# Patient Record
Sex: Female | Born: 1935 | Race: White | Hispanic: No | Smoking: Never smoker
Health system: Southern US, Community
[De-identification: ages and names within clinical notes are randomized; demographics above are authoritative.]

## PROBLEM LIST (undated history)

## (undated) DIAGNOSIS — K219 Gastro-esophageal reflux disease without esophagitis: Secondary | ICD-10-CM

## (undated) DIAGNOSIS — E7800 Pure hypercholesterolemia, unspecified: Secondary | ICD-10-CM

## (undated) DIAGNOSIS — B019 Varicella without complication: Secondary | ICD-10-CM

## (undated) DIAGNOSIS — M1990 Unspecified osteoarthritis, unspecified site: Secondary | ICD-10-CM

## (undated) DIAGNOSIS — I10 Essential (primary) hypertension: Secondary | ICD-10-CM

## (undated) HISTORY — DX: Unspecified osteoarthritis, unspecified site: M19.90

## (undated) HISTORY — DX: Essential (primary) hypertension: I10

## (undated) HISTORY — DX: Gastro-esophageal reflux disease without esophagitis: K21.9

## (undated) HISTORY — DX: Pure hypercholesterolemia, unspecified: E78.00

## (undated) HISTORY — DX: Varicella without complication: B01.9

---
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
## 1974-04-09 HISTORY — PX: ABDOMINAL HYSTERECTOMY: SHX81

## 2004-07-24 ENCOUNTER — Ambulatory Visit: Payer: Self-pay | Admitting: Internal Medicine

## 2005-12-25 ENCOUNTER — Ambulatory Visit: Payer: Self-pay | Admitting: Internal Medicine

## 2007-01-23 ENCOUNTER — Ambulatory Visit: Payer: Self-pay | Admitting: Internal Medicine

## 2008-02-10 ENCOUNTER — Ambulatory Visit: Payer: Self-pay | Admitting: Internal Medicine

## 2009-02-17 ENCOUNTER — Ambulatory Visit: Payer: Self-pay | Admitting: Internal Medicine

## 2010-06-15 ENCOUNTER — Encounter: Payer: Self-pay | Admitting: Internal Medicine

## 2010-07-09 ENCOUNTER — Encounter: Payer: Self-pay | Admitting: Internal Medicine

## 2010-09-20 ENCOUNTER — Ambulatory Visit: Payer: Self-pay | Admitting: Internal Medicine

## 2010-10-16 ENCOUNTER — Ambulatory Visit: Payer: Self-pay | Admitting: Ophthalmology

## 2010-10-24 ENCOUNTER — Ambulatory Visit: Payer: Self-pay | Admitting: Ophthalmology

## 2010-12-06 ENCOUNTER — Ambulatory Visit: Payer: Self-pay | Admitting: Internal Medicine

## 2010-12-20 ENCOUNTER — Ambulatory Visit: Payer: Self-pay | Admitting: Ophthalmology

## 2011-01-01 ENCOUNTER — Ambulatory Visit: Payer: Self-pay | Admitting: Ophthalmology

## 2011-02-15 ENCOUNTER — Ambulatory Visit: Payer: Self-pay | Admitting: Gastroenterology

## 2011-02-19 LAB — PATHOLOGY REPORT

## 2011-08-03 ENCOUNTER — Ambulatory Visit: Payer: Self-pay | Admitting: Internal Medicine

## 2011-08-03 LAB — HM MAMMOGRAPHY

## 2011-08-06 ENCOUNTER — Ambulatory Visit: Payer: Self-pay | Admitting: Internal Medicine

## 2012-01-14 ENCOUNTER — Ambulatory Visit (INDEPENDENT_AMBULATORY_CARE_PROVIDER_SITE_OTHER): Payer: Medicare Other | Admitting: Internal Medicine

## 2012-01-14 ENCOUNTER — Encounter: Payer: Self-pay | Admitting: Internal Medicine

## 2012-01-14 VITALS — BP 120/72 | HR 56 | Temp 97.8°F | Ht 65.25 in | Wt 147.5 lb

## 2012-01-14 DIAGNOSIS — R202 Paresthesia of skin: Secondary | ICD-10-CM

## 2012-01-14 DIAGNOSIS — R350 Frequency of micturition: Secondary | ICD-10-CM

## 2012-01-14 DIAGNOSIS — R209 Unspecified disturbances of skin sensation: Secondary | ICD-10-CM

## 2012-01-14 DIAGNOSIS — E7800 Pure hypercholesterolemia, unspecified: Secondary | ICD-10-CM | POA: Insufficient documentation

## 2012-01-14 DIAGNOSIS — K219 Gastro-esophageal reflux disease without esophagitis: Secondary | ICD-10-CM

## 2012-01-14 DIAGNOSIS — I10 Essential (primary) hypertension: Secondary | ICD-10-CM

## 2012-01-14 DIAGNOSIS — E871 Hypo-osmolality and hyponatremia: Secondary | ICD-10-CM

## 2012-01-14 DIAGNOSIS — Z23 Encounter for immunization: Secondary | ICD-10-CM

## 2012-01-14 LAB — POCT URINALYSIS DIPSTICK
Bilirubin, UA: NEGATIVE
Ketones, UA: NEGATIVE
Spec Grav, UA: 1.015
pH, UA: 7.5

## 2012-01-14 NOTE — Progress Notes (Signed)
  Subjective:    Patient ID: Janet Moran, female    DOB: February 14, 1926, 78 y.o.   MRN: [id number]  HPI 78 year old female with past history of hypertension, hypercholesterolemia and GERD who comes in today for a scheduled follow up.  She states she has been doing well.  Has noticed that her urine is more yellow and has a strong odor.  (Present now for approximately one month).  No abdominal pain or discomfort.  No dysuria or hematuria, but does report some increased frequency.  No new medications or vitamins.  Blood pressure has been doing well.  (averaging 120's/60s).  Prilosec controls her reflux.  She has noticed some left hand tingling and numbness - worse at night and when she holds a book, etc.  No numbness or tingling extending up the arm.  No arm weakness.  No chest pain or tightness.  Is not left handed, but does do a lot of activities with her left hand.    Past Medical History  Diagnosis Date  . Arthritis   . Chicken pox   . GERD (gastroesophageal reflux disease)   . Glaucoma   . Hypertension   . Hypercholesterolemia     Review of Systems Patient denies any headache, lightheadedness or dizziness.  No chest pain, tightness or palpatations.  No increased shortness of breath, cough or congestion.  No nausea or vomiting. Acid reflux controlled. No abdominal pain or cramping.  No bowel change, such as diarrhea, constipation, BRBPR or melana. No vaginal problems.          Objective:   Physical Exam Filed Vitals:   01/14/12 1044  BP: 120/72  Pulse: 56  Temp: 97.8 F (96.71 C)   78 year old female in no acute distress.   HEENT:  Nares - clear.  OP- without lesions or erythema.  NECK:  Supple, nontender.  No audible carotid bruit.   HEART:  Appears to be regular. LUNGS:  Without crackles or wheezing audible.  Respirations even and unlabored.   RADIAL PULSE:  Equal bilaterally.  ABDOMEN:  Soft, nontender.  No audible abdominal bruit.   EXTREMITIES:  No increased edema to be  present.        Grip strength - equal bilaterally (normal).  Negative Phalens and Tinels.                Assessment & Plan:  1.  URINARY FREQUENCY.  Check urine dip to confirm no infection.    2.  LEFT HAND NUMBNESS/TINGLING.  Appears to be consistant with CTS.  Treat conservatively.  Cock up wrist splint.  Call with update over the next couple of weeks.  If persistant problems will required further evaluation.  Call if symptoms worsen or change.    HEALTH MAINTENANCE.  Schedule her physical 07/2012.  Mammogram 08/03/11 (including follow up right unilateral mammo) - ok.  Schedule follow up when due.  Received shingles vaccine 07/24/11.  Flu shot given today.

## 2012-01-14 NOTE — Assessment & Plan Note (Signed)
Blood pressure has been under good control.  Continue same medications.  Follow metabolic panel.   

## 2012-01-14 NOTE — Assessment & Plan Note (Signed)
Continue same medication.  Low cholesterol diet and exercise.  Follow lipid profile and liver panel.   

## 2012-01-14 NOTE — Assessment & Plan Note (Signed)
Has a history of slightly decreased sodium.  Has been stable.  Check metabolic panel with next labs.

## 2012-01-14 NOTE — Patient Instructions (Signed)
It was good seeing you today.  I am going to check your urine to confirm no infection.  We will let you know when results are available.  I also want you to try wearing the wrist splint (as we discussed) - to see if this helps the hand numbness and tingling.  Let me know if it is a persistant problem.

## 2012-01-14 NOTE — Assessment & Plan Note (Signed)
Reflux symptoms controlled.  Continue Prilosec.

## 2012-01-16 LAB — URINE CULTURE

## 2012-03-17 ENCOUNTER — Other Ambulatory Visit: Payer: Self-pay | Admitting: *Deleted

## 2012-03-17 MED ORDER — LOSARTAN POTASSIUM-HCTZ 100-25 MG PO TABS: 1.0000 | ORAL_TABLET | Freq: Every day | ORAL | Status: DC

## 2012-03-17 MED ORDER — AMLODIPINE BESYLATE 5 MG PO TABS: 5.0000 mg | ORAL_TABLET | Freq: Every day | ORAL | Status: DC

## 2012-03-17 NOTE — Telephone Encounter (Signed)
Refilled amlodipine 5 mg and losartan/hctz 100-25 mg tab

## 2012-06-11 ENCOUNTER — Other Ambulatory Visit: Payer: Self-pay | Admitting: *Deleted

## 2012-06-11 MED ORDER — LOSARTAN POTASSIUM-HCTZ 100-25 MG PO TABS: 1.0000 | ORAL_TABLET | Freq: Every day | ORAL | Status: DC

## 2012-06-11 NOTE — Telephone Encounter (Signed)
Sent in to pharmacy.  

## 2012-07-09 ENCOUNTER — Encounter: Payer: Self-pay | Admitting: Internal Medicine

## 2012-07-11 ENCOUNTER — Other Ambulatory Visit: Payer: Self-pay | Admitting: *Deleted

## 2012-07-14 MED ORDER — AMLODIPINE BESYLATE 5 MG PO TABS: 5.0000 mg | ORAL_TABLET | Freq: Every day | ORAL | Status: DC

## 2012-07-14 NOTE — Telephone Encounter (Signed)
Rx sent in to pharmacy. 

## 2012-07-17 ENCOUNTER — Other Ambulatory Visit (INDEPENDENT_AMBULATORY_CARE_PROVIDER_SITE_OTHER): Payer: Medicare Other

## 2012-07-17 DIAGNOSIS — I10 Essential (primary) hypertension: Secondary | ICD-10-CM

## 2012-07-17 DIAGNOSIS — E7800 Pure hypercholesterolemia, unspecified: Secondary | ICD-10-CM

## 2012-07-17 LAB — HEPATIC FUNCTION PANEL
ALT: 16 U/L (ref 0–35)
Total Protein: 6.8 g/dL (ref 6.0–8.3)

## 2012-07-17 LAB — LIPID PANEL
Cholesterol: 184 mg/dL (ref 0–200)
HDL: 65.4 mg/dL (ref >39.00)
VLDL: 20.4 mg/dL (ref 0.0–40.0)

## 2012-07-18 LAB — BASIC METABOLIC PANEL WITH GFR
Chloride: 95 mEq/L — ABNORMAL LOW (ref 96–112)
GFR, Est Non African American: 85 mL/min
Potassium: 3.5 mEq/L (ref 3.5–5.3)
Sodium: 132 mEq/L — ABNORMAL LOW (ref 135–145)

## 2012-07-22 ENCOUNTER — Ambulatory Visit (INDEPENDENT_AMBULATORY_CARE_PROVIDER_SITE_OTHER): Payer: Medicare Other | Admitting: Internal Medicine

## 2012-07-22 ENCOUNTER — Encounter: Payer: Self-pay | Admitting: Internal Medicine

## 2012-07-22 VITALS — BP 122/70 | HR 67 | Temp 97.4°F | Ht 65.5 in | Wt 145.5 lb

## 2012-07-22 DIAGNOSIS — E871 Hypo-osmolality and hyponatremia: Secondary | ICD-10-CM

## 2012-07-22 DIAGNOSIS — Z1239 Encounter for other screening for malignant neoplasm of breast: Secondary | ICD-10-CM

## 2012-07-22 DIAGNOSIS — K219 Gastro-esophageal reflux disease without esophagitis: Secondary | ICD-10-CM

## 2012-07-22 DIAGNOSIS — I10 Essential (primary) hypertension: Secondary | ICD-10-CM

## 2012-07-22 DIAGNOSIS — E7800 Pure hypercholesterolemia, unspecified: Secondary | ICD-10-CM

## 2012-07-22 LAB — SODIUM: Sodium: 128 mEq/L — ABNORMAL LOW (ref 135–145)

## 2012-07-22 MED ORDER — METOPROLOL SUCCINATE ER 50 MG PO TB24: ORAL_TABLET | ORAL | Status: DC

## 2012-07-22 MED ORDER — SIMVASTATIN 20 MG PO TABS: 20.0000 mg | ORAL_TABLET | Freq: Every evening | ORAL | Status: DC

## 2012-07-23 ENCOUNTER — Telehealth: Payer: Self-pay | Admitting: Internal Medicine

## 2012-07-23 DIAGNOSIS — E871 Hypo-osmolality and hyponatremia: Secondary | ICD-10-CM

## 2012-07-23 NOTE — Telephone Encounter (Signed)
Dr Lorin Picket we will not be in the office on Friday.  I called and reschedule her labs to Monday.  Is this ok or do you want Ms Orbach come in tomorrow? Thanks Zella Ball

## 2012-07-23 NOTE — Telephone Encounter (Signed)
Changed to 4/17 pt aware

## 2012-07-23 NOTE — Telephone Encounter (Signed)
I would like for her to come in Thursday.  Please call her and let her know.  Thanks.

## 2012-07-23 NOTE — Telephone Encounter (Signed)
Pt notified of low sodium.  Instructions given.  She is coming in for follow up lab on 07/25/12 (10:00).   Please put on lab schedule.  Pt aware of appt.  Thanks.

## 2012-07-24 ENCOUNTER — Other Ambulatory Visit (INDEPENDENT_AMBULATORY_CARE_PROVIDER_SITE_OTHER): Payer: Medicare Other

## 2012-07-24 DIAGNOSIS — E7800 Pure hypercholesterolemia, unspecified: Secondary | ICD-10-CM

## 2012-07-24 DIAGNOSIS — E871 Hypo-osmolality and hyponatremia: Secondary | ICD-10-CM

## 2012-07-24 DIAGNOSIS — K219 Gastro-esophageal reflux disease without esophagitis: Secondary | ICD-10-CM

## 2012-07-24 DIAGNOSIS — I10 Essential (primary) hypertension: Secondary | ICD-10-CM

## 2012-07-24 LAB — SODIUM: Sodium: 137 mEq/L (ref 135–145)

## 2012-07-25 ENCOUNTER — Encounter: Payer: Self-pay | Admitting: Internal Medicine

## 2012-07-27 ENCOUNTER — Encounter: Payer: Self-pay | Admitting: Internal Medicine

## 2012-07-27 NOTE — Assessment & Plan Note (Signed)
Continue Prilosec in the am.  With some persistent acid reflux.  May be contributing to allergy issues and cough.  Add zantac in the evening.  Follow.  Notify me if persistent.

## 2012-07-27 NOTE — Assessment & Plan Note (Signed)
Continue same medication.  Low cholesterol diet and exercise.  Follow lipid profile and liver panel.  Recent cholesterol panel 07/17/12 reviewed.  No changes made.  Wnl.

## 2012-07-27 NOTE — Progress Notes (Signed)
Subjective:    Patient ID: Janet Moran, female    DOB: [birth year], 79 y.o.   MRN: [id number]  HPI 79 year old female with past history of hypertension, hypercholesterolemia and GERD who comes in today to follow up on these issues as well as for a complete physical exam.  She states she has been doing well.  [place].   No abdominal pain or discomfort.  No dysuria or hematuria.   Blood pressure has been doing well.   Had a cold in 3/14.  Now using ocean spray at night.  No significant problems now.  Some acid reflux.  On prilosec.  Bowels are stable.  States she takes metamucil in the am and prune juice in the evening - and this works.  Overall she feels things are stable.    Past Medical History  Diagnosis Date  . Arthritis   . Chicken pox   . GERD (gastroesophageal reflux disease)   . Glaucoma   . Hypertension   . Hypercholesterolemia     Current Outpatient Prescriptions on File Prior to Visit  Medication Sig Dispense Refill  . amLODipine (NORVASC) 5 MG tablet Take 1 tablet (5 mg total) by mouth daily.  30 tablet  5  . aspirin EC 81 MG tablet Take 81 mg by mouth daily.      . calcium carbonate (OS-CAL) 600 MG TABS Take 600 mg by mouth 2 (two) times daily with a meal. Take 1 tablet by mouth twice daily with food      . dorzolamide (TRUSOPT) 2 % ophthalmic solution 1 drop 3 (three) times daily. Apply 1 drop into both eyes twice a day      . latanoprost (XALATAN) 0.005 % ophthalmic solution Place 1 drop into both eyes at bedtime.      Marland Kitchen losartan-hydrochlorothiazide (HYZAAR) 100-25 MG per tablet Take 1 tablet by mouth daily.  30 tablet  5  . omeprazole (PRILOSEC) 20 MG capsule Take 20 mg by mouth daily.      . fish oil-omega-3 fatty acids 1000 MG capsule Take 2 g by mouth daily. Take 1 capsule once a day      . timolol (BETIMOL) 0.25 % ophthalmic solution Place 1-2 drops into both eyes 2 (two) times daily.      Marland Kitchen triamcinolone (NASACORT) 55 MCG/ACT nasal inhaler Place 2 sprays  into the nose daily. Spray 1-2 sprays into both nostrils once a day as needed for allergies       No current facility-administered medications on file prior to visit.      Review of Systems Patient denies any headache, lightheadedness or dizziness.  Previous cold has essentially resolved.  No chest pain, tightness or palpitations.  No increased shortness of breath.  Some cough now.  Relates it to allergies.   No nausea or vomiting.  Some acid reflux.  Taking her prilosec.   No abdominal pain or cramping.  No bowel change, such as diarrhea, constipation, BRBPR or melana.  Controls bowels with metamucil and prune juice as outlined.   No vaginal problems.          Objective:   Physical Exam  Filed Vitals:   07/22/12 1025  BP: 122/70  Pulse: 67  Temp: 97.4 F (61.40 C)   79 year old female in no acute distress.   HEENT:  Nares- clear.  Oropharynx - without lesions. NECK:  Supple.  Nontender.  No audible bruit.  HEART:  Appears to be regular. LUNGS:  No crackles or wheezing audible.  Respirations even and unlabored.  RADIAL PULSE:  Equal bilaterally.    BREASTS:  No nipple discharge or nipple retraction present.  Could not appreciate any distinct nodules or axillary adenopathy.  ABDOMEN:  Soft, nontender.  Bowel sounds present and normal.  No audible abdominal bruit.  GU:  Normal external genitalia.  Vaginal vault without lesions.  S/p hysterectomy.  Could not appreciate any adnexal masses or tenderness.   RECTAL:  Heme negative.   EXTREMITIES:  No increased edema present.  DP pulses palpable and equal bilaterally.          Assessment & Plan:  LEFT HAND NUMBNESS/TINGLING. Did not report as an issue today.   COUGH.  Probably multifactorial.  Treat allergy symptoms.  Treat acid reflux.  Lungs clear.  Follow.   HEALTH MAINTENANCE.  Physical today.   Mammogram 08/03/11 (including follow up right unilateral mammo) - ok.  Schedule follow up when due.  Received shingles vaccine 07/24/11.

## 2012-07-27 NOTE — Assessment & Plan Note (Signed)
Has a history of slightly decreased sodium.  Has been stable.  Recent check revealed sodium to be slightly decreased.  Recheck today.

## 2012-07-27 NOTE — Assessment & Plan Note (Signed)
Blood pressure has been under good control.  Continue same medications.  Follow metabolic panel.   

## 2012-07-28 ENCOUNTER — Other Ambulatory Visit: Payer: Medicare Other

## 2012-08-25 ENCOUNTER — Ambulatory Visit: Payer: Self-pay | Admitting: Internal Medicine

## 2012-08-30 ENCOUNTER — Encounter: Payer: Self-pay | Admitting: Internal Medicine

## 2012-10-17 ENCOUNTER — Telehealth: Payer: Self-pay | Admitting: Internal Medicine

## 2012-10-17 NOTE — Telephone Encounter (Signed)
Pt okay to keep appt on Tuesday

## 2012-10-17 NOTE — Telephone Encounter (Signed)
Patient Information:  Caller Name: Sarann  Phone: [phone]  Patient: Janet Moran  Gender: Female  DOB: [birth year]  Age: 79 Years  PCP: [facility] [place]  Office Follow Up:  Does the office need to follow up with this patient?: Yes  Instructions For The Office: Please contact patient for Appt time yet today or early next week since going out of area on vacation later next week  Can reach patient at  [phone].  RN Note:  Going out of area for vacation next week.  Symptoms  Reason For Call & Symptoms: Cough started in April 2014    Cough seems to be caused by tickle in throat, feels like clearing throat type of cough.  Will cough up slight amount of  clear fluid at rare times.  Reviewed Health History In EMR: Yes  Reviewed Medications In EMR: Yes  Reviewed Allergies In EMR: Yes  Reviewed Surgeries / Procedures: Yes  Date of Onset of Symptoms: Unknown  Treatments Tried: Apple ccider vinegar, cough drops, chewing gum, Claritin, 2-3 bottles of Delsym  Treatments Tried Worked: No  Guideline(s) Used:  Cough  Disposition Per Guideline:   See Within 3 Days in Office  Reason For Disposition Reached:   Cough has been present for > 10 days  Advice Given:  N/A  Patient Will Follow Care Advice:  YES

## 2012-10-17 NOTE — Telephone Encounter (Signed)
Patient only wants to see Dr.Scott offered appointment with another provider patient refused patient has an appointment with Dr. Lorin Picket 7/15 patient is afebrile and reports cough is like a tickle in throat. Coughing clear mucus reported by patient very small amount.

## 2012-10-17 NOTE — Telephone Encounter (Signed)
Has an appt 7/15 14 at 11:15.  Confirm ok with pt to wait until this appt.  (also please block 11:30 appt slot).  Thanks.

## 2012-10-21 ENCOUNTER — Ambulatory Visit (INDEPENDENT_AMBULATORY_CARE_PROVIDER_SITE_OTHER): Payer: Medicare Other | Admitting: Internal Medicine

## 2012-10-21 ENCOUNTER — Encounter: Payer: Self-pay | Admitting: Internal Medicine

## 2012-10-21 VITALS — BP 130/60 | HR 55 | Temp 98.6°F | Ht 65.5 in | Wt 145.8 lb

## 2012-10-21 DIAGNOSIS — I10 Essential (primary) hypertension: Secondary | ICD-10-CM

## 2012-10-21 DIAGNOSIS — K219 Gastro-esophageal reflux disease without esophagitis: Secondary | ICD-10-CM

## 2012-10-21 MED ORDER — TRIAMCINOLONE ACETONIDE(NASAL) 55 MCG/ACT NA INHA: 2.0000 | Freq: Every day | NASAL | Status: DC

## 2012-10-21 NOTE — Patient Instructions (Signed)
Use saline nasal spray - flush nose at least 2-3 times per day.  Nasacort - two sprays each nostril one time per day (in the evening).  Zyrtec - one per day.

## 2012-10-22 ENCOUNTER — Encounter: Payer: Self-pay | Admitting: Internal Medicine

## 2012-10-25 ENCOUNTER — Encounter: Payer: Self-pay | Admitting: Internal Medicine

## 2012-10-25 NOTE — Assessment & Plan Note (Signed)
Doing well on current med regimen.  Follow.   

## 2012-10-25 NOTE — Progress Notes (Signed)
Subjective:    Patient ID: Janet Moran, female    DOB: [birth year], 79 y.o.   MRN: [id number]  Cough  Ear Fullness  Associated symptoms include coughing.  79 year old female with past history of hypertension, hypercholesterolemia and GERD who comes in today as a work in with concerns regarding increased drainage and tickling in throat.  States symptoms started in the Spring.  Tried Claritin and Delsym.  Symptoms did not resolve.  Has increased post nasal drainage and itching behind her ears.  Some tickling in her throat.  No chest congestion.  On prevacid.  No acid reflux.  No sob.  No fever.     Past Medical History  Diagnosis Date  . Arthritis   . Chicken pox   . GERD (gastroesophageal reflux disease)   . Glaucoma   . Hypertension   . Hypercholesterolemia      Current Outpatient Prescriptions on File Prior to Visit  Medication Sig Dispense Refill  . amLODipine (NORVASC) 5 MG tablet Take 1 tablet (5 mg total) by mouth daily.  30 tablet  5  . aspirin EC 81 MG tablet Take 81 mg by mouth daily.      . calcium carbonate (OS-CAL) 600 MG TABS Take 600 mg by mouth 2 (two) times daily with a meal. Take 1 tablet by mouth twice daily with food      . latanoprost (XALATAN) 0.005 % ophthalmic solution Place 1 drop into both eyes at bedtime.      Marland Kitchen losartan-hydrochlorothiazide (HYZAAR) 100-25 MG per tablet Take 1 tablet by mouth daily.  30 tablet  5  . metoprolol succinate (TOPROL-XL) 50 MG 24 hr tablet Take 1/2 tablet daily  90 tablet  3  . Multiple Vitamins-Minerals (CENTRUM SILVER PO) Take by mouth daily.      Marland Kitchen omeprazole (PRILOSEC) 20 MG capsule Take 20 mg by mouth daily.      . simvastatin (ZOCOR) 20 MG tablet Take 1 tablet (20 mg total) by mouth every evening.  90 tablet  3  . timolol (BETIMOL) 0.25 % ophthalmic solution Place 1-2 drops into the right eye 2 (two) times daily.        No current facility-administered medications on file prior to visit.    Review of Systems Patient  denies any headache, lightheadedness or dizziness.  Does report some increased post nasal drainage and tickling in her throat.  No chest congestion.  No sore throat.   No chest pain or tightness. No increased shortness of breath.  No nausea or vomiting. Acid reflux controlled. No abdominal pain or cramping.  No bowel change.       Objective:   Physical Exam  Filed Vitals:   10/21/12 1115  BP: 130/60  Pulse: 55  Temp: 98.6 F (23 C)   79 year old female in no acute distress.   HEENT:  Nares - clear.  Slightly erythematous turbinates.  OP- without lesions or erythema.  NECK:  Supple, nontender.  No audible carotid bruit.   HEART:  Appears to be regular. LUNGS:  Without crackles or wheezing audible.  Respirations even and unlabored.   RADIAL PULSE:  Equal bilaterally.                Assessment & Plan:  PROBABLE ALLERGIES.  Treat with zyrtec daily.  Nasocort as directed.  Saline nasal flushes as directed.  Call with update in 2-3 weeks.     HEALTH MAINTENANCE.  Physical 07/26/2012.  Mammogram 08/25/12.  Birads II.  Received shingles vaccine 07/24/11.

## 2012-10-25 NOTE — Assessment & Plan Note (Signed)
Blood pressure has been under good control.  Continue same medications.  Follow metabolic panel.   

## 2012-11-12 ENCOUNTER — Other Ambulatory Visit: Payer: Self-pay

## 2012-11-24 ENCOUNTER — Ambulatory Visit: Payer: Medicare Other | Admitting: Internal Medicine

## 2012-12-02 ENCOUNTER — Encounter: Payer: Self-pay | Admitting: Internal Medicine

## 2012-12-02 ENCOUNTER — Ambulatory Visit (INDEPENDENT_AMBULATORY_CARE_PROVIDER_SITE_OTHER): Payer: Medicare Other | Admitting: Internal Medicine

## 2012-12-02 VITALS — BP 110/60 | HR 60 | Temp 98.4°F | Ht 65.5 in | Wt 146.5 lb

## 2012-12-02 DIAGNOSIS — I10 Essential (primary) hypertension: Secondary | ICD-10-CM

## 2012-12-02 DIAGNOSIS — E7800 Pure hypercholesterolemia, unspecified: Secondary | ICD-10-CM

## 2012-12-02 DIAGNOSIS — E871 Hypo-osmolality and hyponatremia: Secondary | ICD-10-CM

## 2012-12-02 DIAGNOSIS — K219 Gastro-esophageal reflux disease without esophagitis: Secondary | ICD-10-CM

## 2012-12-03 ENCOUNTER — Other Ambulatory Visit: Payer: Self-pay | Admitting: *Deleted

## 2012-12-04 MED ORDER — LOSARTAN POTASSIUM-HCTZ 100-25 MG PO TABS: 1.0000 | ORAL_TABLET | Freq: Every day | ORAL | Status: DC

## 2012-12-07 ENCOUNTER — Encounter: Payer: Self-pay | Admitting: Internal Medicine

## 2012-12-07 NOTE — Assessment & Plan Note (Signed)
Has a history of slightly decreased sodium.  Has been stable.  Recent check revealed sodium to be slightly decreased.  Recheck with next labs.

## 2012-12-07 NOTE — Progress Notes (Signed)
Subjective:    Patient ID: Janet Moran, female    DOB: [birth year], 79 y.o.   MRN: [id number]  HPI 79 year old female with past history of hypertension, hypercholesterolemia and GERD who comes in today for a scheduled follow up.  She states she has been doing well.  No abdominal pain or discomfort.  No dysuria or hematuria.   Blood pressure has been doing well.  No significant acid reflux. Takes zyrtec daily.  Allergies better.  Bowels are stable.  States she takes metamucil in the am and prune juice in the evening - and this works.  Overall she feels things are stable.  Does report some increased fatigue.     Past Medical History  Diagnosis Date  . Arthritis   . Chicken pox   . GERD (gastroesophageal reflux disease)   . Glaucoma   . Hypertension   . Hypercholesterolemia     Current Outpatient Prescriptions on File Prior to Visit  Medication Sig Dispense Refill  . amLODipine (NORVASC) 5 MG tablet Take 1 tablet (5 mg total) by mouth daily.  30 tablet  5  . aspirin EC 81 MG tablet Take 81 mg by mouth daily.      . calcium carbonate (OS-CAL) 600 MG TABS Take 600 mg by mouth 2 (two) times daily with a meal. Take 1 tablet by mouth twice daily with food      . latanoprost (XALATAN) 0.005 % ophthalmic solution Place 1 drop into both eyes at bedtime.      . metoprolol succinate (TOPROL-XL) 50 MG 24 hr tablet Take 1/2 tablet daily  90 tablet  3  . omeprazole (PRILOSEC) 20 MG capsule Take 20 mg by mouth daily.      . simvastatin (ZOCOR) 20 MG tablet Take 1 tablet (20 mg total) by mouth every evening.  90 tablet  3  . timolol (BETIMOL) 0.25 % ophthalmic solution Place 1-2 drops into the right eye 2 (two) times daily.       Marland Kitchen triamcinolone (NASACORT) 55 MCG/ACT nasal inhaler Place 2 sprays into the nose daily.  1 Inhaler  4   No current facility-administered medications on file prior to visit.      Review of Systems Patient denies any headache, lightheadedness or dizziness.  No significant  allergy or sinus symptoms.   No chest pain, tightness or palpitations.  No increased shortness of breath.  Cough is better.  No nausea or vomiting.   No significant acid reflux.  No abdominal pain or cramping.  No bowel change, such as diarrhea, constipation, BRBPR or melana.  Controls bowels with metamucil and prune juice as outlined.   No vaginal problems.   Some fatigue.      Objective:   Physical Exam  Filed Vitals:   12/02/12 1100  BP: 110/60  Pulse: 60  Temp: 98.4 F (80.31 C)   79 year old female in no acute distress.   HEENT:  Nares- clear.  Oropharynx - without lesions. NECK:  Supple.  Nontender.  No audible bruit.  HEART:  Appears to be regular. LUNGS:  No crackles or wheezing audible.  Respirations even and unlabored.  RADIAL PULSE:  Equal bilaterally.   ABDOMEN:  Soft, nontender.  Bowel sounds present and normal.  No audible abdominal bruit.   EXTREMITIES:  No increased edema present.  DP pulses palpable and equal bilaterally.          Assessment & Plan:  LEFT HAND NUMBNESS/TINGLING. Did not report as an issue  today.   COUGH.  Better.  Lungs clear.  Follow.   HEALTH MAINTENANCE.  Physical 07/22/12.   Mammogram 08/25/12 - Birads II.  Received shingles vaccine 07/24/11.

## 2012-12-07 NOTE — Assessment & Plan Note (Signed)
Continue same medication.  Low cholesterol diet and exercise.  Follow lipid profile and liver panel.  Recent cholesterol panel 07/17/12 reviewed.  No changes made.  Wnl.  Check with next fasting labs.

## 2012-12-07 NOTE — Assessment & Plan Note (Signed)
Blood pressure has been under good control.  Continue same medications.  Follow metabolic panel.   

## 2012-12-07 NOTE — Assessment & Plan Note (Signed)
Doing well on current med regimen.  Follow.   

## 2012-12-16 ENCOUNTER — Other Ambulatory Visit (INDEPENDENT_AMBULATORY_CARE_PROVIDER_SITE_OTHER): Payer: Medicare Other

## 2012-12-16 DIAGNOSIS — E7800 Pure hypercholesterolemia, unspecified: Secondary | ICD-10-CM

## 2012-12-16 DIAGNOSIS — I10 Essential (primary) hypertension: Secondary | ICD-10-CM

## 2012-12-16 DIAGNOSIS — E871 Hypo-osmolality and hyponatremia: Secondary | ICD-10-CM

## 2012-12-16 DIAGNOSIS — K219 Gastro-esophageal reflux disease without esophagitis: Secondary | ICD-10-CM

## 2012-12-16 LAB — BASIC METABOLIC PANEL
BUN: 13 mg/dL (ref 6–23)
GFR: 102.82 mL/min (ref >60.00)
Potassium: 4.1 mEq/L (ref 3.5–5.1)
Sodium: 135 mEq/L (ref 135–145)

## 2012-12-16 LAB — CBC WITH DIFFERENTIAL/PLATELET
Basophils Absolute: 0 10*3/uL (ref 0.0–0.1)
HCT: 40.2 % (ref 36.0–46.0)
Lymphs Abs: 1.4 10*3/uL (ref 0.7–4.0)
MCV: 94 fl (ref 78.0–100.0)
Monocytes Absolute: 0.5 10*3/uL (ref 0.1–1.0)
Platelets: 229 10*3/uL (ref 150.0–400.0)
RDW: 13.3 % (ref 11.5–14.6)

## 2012-12-16 LAB — TSH: TSH: 2.17 u[IU]/mL (ref 0.35–5.50)

## 2012-12-16 LAB — HEPATIC FUNCTION PANEL
ALT: 15 U/L (ref 0–35)
Bilirubin, Direct: 0.1 mg/dL (ref 0.0–0.3)
Total Protein: 6.9 g/dL (ref 6.0–8.3)

## 2012-12-16 LAB — LIPID PANEL: Cholesterol: 196 mg/dL (ref 0–200)

## 2012-12-17 ENCOUNTER — Encounter: Payer: Self-pay | Admitting: Internal Medicine

## 2013-01-12 ENCOUNTER — Other Ambulatory Visit: Payer: Self-pay | Admitting: *Deleted

## 2013-01-12 MED ORDER — AMLODIPINE BESYLATE 5 MG PO TABS: 5.0000 mg | ORAL_TABLET | Freq: Every day | ORAL | Status: DC

## 2013-02-12 ENCOUNTER — Other Ambulatory Visit: Payer: Self-pay

## 2013-02-24 ENCOUNTER — Ambulatory Visit (INDEPENDENT_AMBULATORY_CARE_PROVIDER_SITE_OTHER): Payer: Medicare Other

## 2013-02-24 DIAGNOSIS — Z23 Encounter for immunization: Secondary | ICD-10-CM

## 2013-03-30 ENCOUNTER — Ambulatory Visit (INDEPENDENT_AMBULATORY_CARE_PROVIDER_SITE_OTHER): Payer: Medicare Other | Admitting: Podiatry

## 2013-03-30 ENCOUNTER — Encounter: Payer: Self-pay | Admitting: Podiatry

## 2013-03-30 VITALS — BP 130/59 | HR 57 | Resp 14

## 2013-03-30 DIAGNOSIS — M79609 Pain in unspecified limb: Secondary | ICD-10-CM

## 2013-03-30 DIAGNOSIS — B351 Tinea unguium: Secondary | ICD-10-CM

## 2013-03-30 NOTE — Progress Notes (Signed)
Janet Moran presents in a chief complaint of painful toenails one through 5 bilateral.  Objective: Vital signs are stable she is alert and oriented x3. Pulses remain palpable bilateral. Nails are thick yellow dystrophic clinically mycotic and painful palpation as well as treatment.  Assessment: Pain in limb secondary to onychomycosis bilateral.  Plan: Debridement nails in thickness and length as a covered service secondary to pain.

## 2013-04-06 ENCOUNTER — Encounter: Payer: Self-pay | Admitting: Internal Medicine

## 2013-04-06 ENCOUNTER — Ambulatory Visit (INDEPENDENT_AMBULATORY_CARE_PROVIDER_SITE_OTHER): Payer: Medicare Other | Admitting: Internal Medicine

## 2013-04-06 VITALS — BP 110/60 | HR 58 | Temp 97.8°F | Ht 65.5 in | Wt 150.0 lb

## 2013-04-06 DIAGNOSIS — E871 Hypo-osmolality and hyponatremia: Secondary | ICD-10-CM

## 2013-04-06 DIAGNOSIS — I10 Essential (primary) hypertension: Secondary | ICD-10-CM

## 2013-04-06 DIAGNOSIS — J329 Chronic sinusitis, unspecified: Secondary | ICD-10-CM

## 2013-04-06 DIAGNOSIS — K219 Gastro-esophageal reflux disease without esophagitis: Secondary | ICD-10-CM

## 2013-04-06 DIAGNOSIS — E7800 Pure hypercholesterolemia, unspecified: Secondary | ICD-10-CM

## 2013-04-06 MED ORDER — AMOXICILLIN 875 MG PO TABS: 875.0000 mg | ORAL_TABLET | Freq: Two times a day (BID) | ORAL | Status: DC

## 2013-04-06 NOTE — Patient Instructions (Signed)
Continue saline nasal spray.  mucinex in the am and robitussin in the evening.

## 2013-04-06 NOTE — Progress Notes (Signed)
Pre-visit discussion using our clinic review tool. No additional management support is needed unless otherwise documented below in the visit note.  

## 2013-04-09 ENCOUNTER — Encounter: Payer: Self-pay | Admitting: Internal Medicine

## 2013-04-09 DIAGNOSIS — M549 Dorsalgia, unspecified: Secondary | ICD-10-CM | POA: Insufficient documentation

## 2013-04-09 NOTE — Assessment & Plan Note (Signed)
Continue saline nasal spray as outlined.  mucinex in the am and robitussin in the evening.  Amoxicillin as directed.  Follow.

## 2013-04-09 NOTE — Assessment & Plan Note (Signed)
Blood pressure has been under good control.  Continue same medications.  Follow metabolic panel.   

## 2013-04-09 NOTE — Assessment & Plan Note (Signed)
Has a history of slightly decreased sodium.  Has been stable.  Follow.    

## 2013-04-09 NOTE — Assessment & Plan Note (Signed)
Continue same medication.  Low cholesterol diet and exercise.  Follow lipid profile and liver panel.   

## 2013-04-09 NOTE — Assessment & Plan Note (Signed)
Doing well on current med regimen.  Follow.   

## 2013-04-09 NOTE — Progress Notes (Signed)
Subjective:    Patient ID: Janet Moran, female    DOB: 1925/12/12, 80 y.o.   MRN: [id number]  URI   80 year old female with past history of hypertension, hypercholesterolemia and GERD who comes in today for a scheduled follow up.  She states she has been doing well.  No abdominal pain or discomfort.  No dysuria or hematuria.   Blood pressure has been doing well.  No significant acid reflux.  Does report increased left maxillary pressure.   Yellow mucus production.  Blood tinged mucus.  No chest congestion.  Bowels are stable.  States she takes metamucil in the am and prune juice in the evening - and this works.     Past Medical History  Diagnosis Date  . Arthritis   . Chicken pox   . GERD (gastroesophageal reflux disease)   . Glaucoma   . Hypertension   . Hypercholesterolemia     Current Outpatient Prescriptions on File Prior to Visit  Medication Sig Dispense Refill  . amLODipine (NORVASC) 5 MG tablet Take 1 tablet (5 mg total) by mouth daily.  30 tablet  5  . aspirin EC 81 MG tablet Take 81 mg by mouth daily.      . calcium carbonate (OS-CAL) 600 MG TABS Take 600 mg by mouth 2 (two) times daily with a meal. Take 1 tablet by mouth twice daily with food      . latanoprost (XALATAN) 0.005 % ophthalmic solution Place 1 drop into both eyes at bedtime.      Marland Kitchen losartan-hydrochlorothiazide (HYZAAR) 100-25 MG per tablet Take 1 tablet by mouth daily.  30 tablet  5  . metoprolol succinate (TOPROL-XL) 50 MG 24 hr tablet Take 1/2 tablet daily  90 tablet  3  . omeprazole (PRILOSEC) 20 MG capsule Take 20 mg by mouth daily.      . simvastatin (ZOCOR) 20 MG tablet Take 1 tablet (20 mg total) by mouth every evening.  90 tablet  3  . timolol (BETIMOL) 0.25 % ophthalmic solution Place 1-2 drops into the right eye 2 (two) times daily.       Marland Kitchen triamcinolone (NASACORT) 55 MCG/ACT nasal inhaler Place 2 sprays into the nose daily.  1 Inhaler  4   No current facility-administered medications on file prior  to visit.    Review of Systems Patient denies any headache, lightheadedness or dizziness.  Increased sinus pressure and congestion as outlined.  Yellow/bloody mucus production as outlined.  No chest pain, tightness or palpitations.  No increased shortness of breath.  Cough is better.  No nausea or vomiting.   No significant acid reflux.  No abdominal pain or cramping.  No bowel change, such as diarrhea, constipation, BRBPR or melana.  Controls bowels with metamucil and prune juice as outlined.   No vaginal problems.       Objective:   Physical Exam  Filed Vitals:   04/06/13 1148  BP: 110/60  Pulse: 58  Temp: 97.8 F (36.6 C)   Blood pressure recheck:  118/68, pulse 10  80 year old female in no acute distress.   HEENT:  Nares- erythematous turbinates.  Oropharynx - without lesions.  Minimal tenderness to palpation over the left maxillary sinus.  TMs visualized - no erythema.   NECK:  Supple.  Nontender.  No audible bruit.  HEART:  Appears to be regular. LUNGS:  No crackles or wheezing audible.  Respirations even and unlabored.  RADIAL PULSE:  Equal bilaterally.   ABDOMEN:  Soft, nontender.  Bowel sounds present and normal.  No audible abdominal bruit.   EXTREMITIES:  No increased edema present.  DP pulses palpable and equal bilaterally.          Assessment & Plan:  LEFT HAND NUMBNESS/TINGLING. Did not report as an issue today.   COUGH.  Better.  Lungs clear.  Follow.   HEALTH MAINTENANCE.  Physical 07/22/12.   Mammogram 08/25/12 - Birads II.  Received shingles vaccine 07/24/11.

## 2013-04-28 ENCOUNTER — Other Ambulatory Visit (INDEPENDENT_AMBULATORY_CARE_PROVIDER_SITE_OTHER): Payer: Medicare HMO

## 2013-04-28 DIAGNOSIS — E7800 Pure hypercholesterolemia, unspecified: Secondary | ICD-10-CM

## 2013-04-28 DIAGNOSIS — I10 Essential (primary) hypertension: Secondary | ICD-10-CM

## 2013-04-28 LAB — HEPATIC FUNCTION PANEL
ALBUMIN: 3.8 g/dL (ref 3.5–5.2)
ALT: 24 U/L (ref 0–35)
AST: 23 U/L (ref 0–37)
Alkaline Phosphatase: 66 U/L (ref 39–117)
Bilirubin, Direct: 0 mg/dL (ref 0.0–0.3)
Total Bilirubin: 0.7 mg/dL (ref 0.3–1.2)
Total Protein: 7.2 g/dL (ref 6.0–8.3)

## 2013-04-28 LAB — BASIC METABOLIC PANEL
BUN: 10 mg/dL (ref 6–23)
CO2: 29 meq/L (ref 19–32)
Calcium: 9.7 mg/dL (ref 8.4–10.5)
Chloride: 98 mEq/L (ref 96–112)
Creatinine, Ser: 0.6 mg/dL (ref 0.4–1.2)
GFR: 98.86 mL/min (ref >60.00)
GLUCOSE: 104 mg/dL — AB (ref 70–99)
POTASSIUM: 3.7 meq/L (ref 3.5–5.1)
SODIUM: 134 meq/L — AB (ref 135–145)

## 2013-04-28 LAB — LIPID PANEL
CHOL/HDL RATIO: 3
Cholesterol: 185 mg/dL (ref 0–200)
HDL: 60.9 mg/dL (ref >39.00)
LDL Cholesterol: 89 mg/dL (ref 0–99)
Triglycerides: 178 mg/dL — ABNORMAL HIGH (ref 0.0–149.0)
VLDL: 35.6 mg/dL (ref 0.0–40.0)

## 2013-04-29 ENCOUNTER — Encounter: Payer: Self-pay | Admitting: Internal Medicine

## 2013-05-01 ENCOUNTER — Telehealth: Payer: Self-pay | Admitting: *Deleted

## 2013-05-01 NOTE — Telephone Encounter (Signed)
Mailed unread mychart message

## 2013-05-25 ENCOUNTER — Telehealth: Payer: Self-pay | Admitting: Emergency Medicine

## 2013-05-25 NOTE — Telephone Encounter (Signed)
[facility] needed [facility]

## 2013-06-08 ENCOUNTER — Other Ambulatory Visit: Payer: Self-pay | Admitting: Internal Medicine

## 2013-06-29 ENCOUNTER — Ambulatory Visit (INDEPENDENT_AMBULATORY_CARE_PROVIDER_SITE_OTHER): Payer: Commercial Managed Care - HMO | Admitting: Podiatry

## 2013-06-29 VITALS — BP 118/52 | HR 64

## 2013-06-29 DIAGNOSIS — M79609 Pain in unspecified limb: Secondary | ICD-10-CM

## 2013-06-29 DIAGNOSIS — B351 Tinea unguium: Secondary | ICD-10-CM

## 2013-06-29 NOTE — Progress Notes (Signed)
She presents today for routine nail debridement she's complaining of painful toenails one through 5 bilateral.  Objective: Vital signs are stable she is alert and oriented x3. Pulses are palpable bilateral. Nails are thick yellow dystrophic onychomycotic and painful palpation.  Assessment: Pain in limb secondary to onychomycosis 1 through 5 bilateral.  Plan: Debridement of nails 1 through 5 bilateral covered service secondary to pain.

## 2013-07-07 ENCOUNTER — Other Ambulatory Visit: Payer: Self-pay | Admitting: Internal Medicine

## 2013-07-28 ENCOUNTER — Encounter: Payer: Self-pay | Admitting: Internal Medicine

## 2013-07-28 ENCOUNTER — Ambulatory Visit (INDEPENDENT_AMBULATORY_CARE_PROVIDER_SITE_OTHER): Payer: Commercial Managed Care - HMO | Admitting: Internal Medicine

## 2013-07-28 VITALS — BP 128/66 | HR 60 | Temp 97.6°F | Resp 14 | Ht 65.5 in | Wt 150.5 lb

## 2013-07-28 DIAGNOSIS — Z1239 Encounter for other screening for malignant neoplasm of breast: Secondary | ICD-10-CM

## 2013-07-28 DIAGNOSIS — E871 Hypo-osmolality and hyponatremia: Secondary | ICD-10-CM

## 2013-07-28 DIAGNOSIS — E7800 Pure hypercholesterolemia, unspecified: Secondary | ICD-10-CM

## 2013-07-28 DIAGNOSIS — I10 Essential (primary) hypertension: Secondary | ICD-10-CM

## 2013-07-28 DIAGNOSIS — M549 Dorsalgia, unspecified: Secondary | ICD-10-CM

## 2013-07-28 DIAGNOSIS — K148 Other diseases of tongue: Secondary | ICD-10-CM

## 2013-07-28 DIAGNOSIS — K219 Gastro-esophageal reflux disease without esophagitis: Secondary | ICD-10-CM

## 2013-07-28 MED ORDER — LOSARTAN POTASSIUM-HCTZ 100-25 MG PO TABS: 1.0000 | ORAL_TABLET | Freq: Every day | ORAL | Status: DC

## 2013-07-28 MED ORDER — SIMVASTATIN 20 MG PO TABS: 20.0000 mg | ORAL_TABLET | Freq: Every evening | ORAL | Status: DC

## 2013-07-28 NOTE — Progress Notes (Signed)
Pre visit review using our clinic review tool, if applicable. No additional management support is needed unless otherwise documented below in the visit note. 

## 2013-07-28 NOTE — Progress Notes (Signed)
Subjective:    Patient ID: Janet Moran, female    DOB: 2/Virl Diamond13/1927, 80 y.o.   MRN: [id number]  HPI 80 year old female with past history of hypertension, hypercholesterolemia and GERD who comes in today to follow up on these issues as well as for a complete physical exam.   She states she has been doing well.  No abdominal pain or discomfort.  No dysuria or hematuria.   Blood pressure has been doing well.  No significant acid reflux. Takes zyrtec daily.  Allergies better.  Bowels are stable.  States she takes metamucil in the am and prune juice in the evening - and this works.  Overall she feels things are stable.  Does report some increased fatigue.  Also reports some low back pain.  Rice bag helps.  No radiation of pain.  Overall stable.     Past Medical History  Diagnosis Date  . Arthritis   . Chicken pox   . GERD (gastroesophageal reflux disease)   . Glaucoma   . Hypertension   . Hypercholesterolemia     Current Outpatient Prescriptions on File Prior to Visit  Medication Sig Dispense Refill  . amLODipine (NORVASC) 5 MG tablet TAKE ONE (1) TABLET BY MOUTH EVERY DAY  30 tablet  5  . aspirin EC 81 MG tablet Take 81 mg by mouth daily.      . calcium carbonate (OS-CAL) 600 MG TABS Take 600 mg by mouth 2 (two) times daily with a meal. Take 1 tablet by mouth twice daily with food      . latanoprost (XALATAN) 0.005 % ophthalmic solution Place 1 drop into both eyes at bedtime.      Marland Kitchen. losartan-hydrochlorothiazide (HYZAAR) 100-25 MG per tablet TAKE ONE (1) TABLET EACH DAY  30 tablet  5  . metoprolol succinate (TOPROL-XL) 50 MG 24 hr tablet Take 1/2 tablet daily  90 tablet  3  . omeprazole (PRILOSEC) 20 MG capsule Take 20 mg by mouth daily.      . simvastatin (ZOCOR) 20 MG tablet Take 1 tablet (20 mg total) by mouth every evening.  90 tablet  3  . timolol (BETIMOL) 0.25 % ophthalmic solution Place 1-2 drops into the right eye 2 (two) times daily.        No current facility-administered  medications on file prior to visit.      Review of Systems Patient denies any headache, lightheadedness or dizziness.  No significant allergy or sinus symptoms.   No chest pain, tightness or palpitations.  No increased shortness of breath.  Cough is better.  No nausea or vomiting.   No significant acid reflux.  No abdominal pain or cramping.  No bowel change, such as diarrhea, constipation, BRBPR or melana.  Controls bowels with metamucil and prune juice as outlined.   No vaginal problems.   Some fatigue.  Back flares at times.       Objective:   Physical Exam  Filed Vitals:   07/28/13 1524  BP: 128/66  Pulse: 60  Temp: 97.6 F (36.4 C)  Resp: 5914   80 year old female in no acute distress.   HEENT:  Nares- clear.  Oropharynx - without lesions. NECK:  Supple.  Nontender.  No audible bruit.  HEART:  Appears to be regular. LUNGS:  No crackles or wheezing audible.  Respirations even and unlabored.  RADIAL PULSE:  Equal bilaterally.    BREASTS:  No nipple discharge or nipple retraction present.  Could not appreciate any distinct  nodules or axillary adenopathy.  ABDOMEN:  Soft, nontender.  Bowel sounds present and normal.  No audible abdominal bruit.  GU:  Not performed.    EXTREMITIES:  No increased edema present.  DP pulses palpable and equal bilaterally.      MSK:  No significant pain in her lower back with straight leg raise.       Assessment & Plan:  COUGH.  Better.  Lungs clear.  Follow.   HEALTH MAINTENANCE.  Physical today.   Mammogram 08/25/12 - Birads II.  schedule f/u mammogram when due.  Received shingles vaccine 07/24/11.

## 2013-08-02 ENCOUNTER — Encounter: Payer: Self-pay | Admitting: Internal Medicine

## 2013-08-02 ENCOUNTER — Telehealth: Payer: Self-pay | Admitting: Internal Medicine

## 2013-08-02 DIAGNOSIS — K148 Other diseases of tongue: Secondary | ICD-10-CM | POA: Insufficient documentation

## 2013-08-02 NOTE — Assessment & Plan Note (Signed)
Blood pressure has been under good control.  Continue same medications.  Follow metabolic panel.

## 2013-08-02 NOTE — Assessment & Plan Note (Signed)
Has a history of slightly decreased sodium.  Has been stable.  Follow.

## 2013-08-02 NOTE — Assessment & Plan Note (Signed)
Planning to see her dentist next week.  Will have them check her tongue.  Will notify me regarding further w/up.

## 2013-08-02 NOTE — Assessment & Plan Note (Signed)
Doing well on current med regimen.  Follow.

## 2013-08-02 NOTE — Telephone Encounter (Signed)
Pt has an appt already scheduled for dermatology (Dr Roseanne KaufmanIsenstein) for 08/21/13 and Dr Oren BracketSydnor [facility]) - 08/11/13.  Was questioning if she needed a referral.  States insurance stated did.  Thanks.

## 2013-08-02 NOTE — Assessment & Plan Note (Signed)
Continue same medication.  Low cholesterol diet and exercise.  Follow lipid profile and liver panel.

## 2013-08-02 NOTE — Assessment & Plan Note (Signed)
Low back pain.  Flares intermittently.  Rice bag helps.  Stretches.  Desires no further intervention.  Follow.

## 2013-08-10 ENCOUNTER — Telehealth: Payer: Self-pay | Admitting: Internal Medicine

## 2013-08-10 NOTE — Telephone Encounter (Signed)
Noted  

## 2013-08-10 NOTE — Telephone Encounter (Signed)
Pt states she was to call to inform Dr. Lorin PicketScott what her dentist said about a place in her mouth.  The patient's tongue is fine.

## 2013-08-10 NOTE — Telephone Encounter (Signed)
FYI

## 2013-08-11 ENCOUNTER — Telehealth: Payer: Self-pay | Admitting: Internal Medicine

## 2013-08-11 NOTE — Telephone Encounter (Signed)
Has come from [facility], saw Dr. Cheryll CockayneSyndor.  Switched to [employer].  Was told two weeks ago by Dr. Lorin PicketScott that the referral would be taken care of before her appt but it was not done.  States [facility] told her to call us and have us call them regarding the referral.  Also states she is to see dermatology in a few weeks, Dr. Roseanne KaufmanIsenstein.  Wants to ensure the dermatology visit will be covered before that appt.

## 2013-08-12 NOTE — Telephone Encounter (Signed)
I  Had sent a message regarding these appts.  Please let me know if I need to do anything more with this.  Please also explain to pt.  Thanks.

## 2013-08-20 NOTE — Telephone Encounter (Signed)
Received fax from [facility] Treating Provider Lemar LoftyCharles Sydnor # of Visits 4 Start Date 08/21/13 End Date 11/19/13 CPT 99499 DX 365.10

## 2013-08-20 NOTE — Telephone Encounter (Signed)
I called to inform patient that we had the auth for her to see Dr. Oren BracketSydnor and she questioned about Dr. Roseanne KaufmanIsenstein since she has an appointment with him tomorrow. I will see if we have had a fax come over in regards to that request.

## 2013-08-21 NOTE — Telephone Encounter (Signed)
Called patient to let her know that we did receive the auth for her to see Dr. Roseanne KaufmanIsenstein today. She was very happy to hear that they wouldn't cancel her appointment. I have also faxed it to Dr. Dorena DewIsenstein's office at [phone].  Received fax from [facility] Treat[facility] Provider Lucy Antiguarin Isenstein # of Visits 6 Start Date 08/21/13 End Date 11/19/13 CPT 99499 DX 706.8

## 2013-09-14 ENCOUNTER — Other Ambulatory Visit: Payer: Self-pay | Admitting: Internal Medicine

## 2013-09-28 ENCOUNTER — Encounter: Payer: Self-pay | Admitting: Podiatry

## 2013-09-28 ENCOUNTER — Ambulatory Visit (INDEPENDENT_AMBULATORY_CARE_PROVIDER_SITE_OTHER): Payer: Commercial Managed Care - HMO | Admitting: Podiatry

## 2013-09-28 DIAGNOSIS — B351 Tinea unguium: Secondary | ICD-10-CM

## 2013-09-28 DIAGNOSIS — M79673 Pain in unspecified foot: Secondary | ICD-10-CM

## 2013-09-28 DIAGNOSIS — M79609 Pain in unspecified limb: Secondary | ICD-10-CM

## 2013-09-28 NOTE — Progress Notes (Signed)
She presents today complaining of painful E. elongated toenails.  Objective: Pulses are palpable bilateral. Nails are thick yellow dystrophic onychomycotic and painful palpation.  Assessment: Pain in limb secondary to onychomycosis 1 through 5 bilateral.  Plan: Debridement of nails 1 through 5 bilateral covered service secondary to pain.

## 2013-10-06 ENCOUNTER — Other Ambulatory Visit (INDEPENDENT_AMBULATORY_CARE_PROVIDER_SITE_OTHER): Payer: Commercial Managed Care - HMO

## 2013-10-06 ENCOUNTER — Encounter: Payer: Self-pay | Admitting: Internal Medicine

## 2013-10-06 DIAGNOSIS — I10 Essential (primary) hypertension: Secondary | ICD-10-CM

## 2013-10-06 DIAGNOSIS — E7800 Pure hypercholesterolemia, unspecified: Secondary | ICD-10-CM

## 2013-10-06 LAB — HEPATIC FUNCTION PANEL
ALBUMIN: 4.3 g/dL (ref 3.5–5.2)
ALT: 23 U/L (ref 0–35)
AST: 21 U/L (ref 0–37)
Alkaline Phosphatase: 55 U/L (ref 39–117)
Bilirubin, Direct: 0.1 mg/dL (ref 0.0–0.3)
Total Bilirubin: 0.7 mg/dL (ref 0.2–1.2)
Total Protein: 7.2 g/dL (ref 6.0–8.3)

## 2013-10-06 LAB — LIPID PANEL
CHOL/HDL RATIO: 2
Cholesterol: 154 mg/dL (ref 0–200)
HDL: 66 mg/dL (ref >39.00)
LDL Cholesterol: 63 mg/dL (ref 0–99)
NonHDL: 88
TRIGLYCERIDES: 127 mg/dL (ref 0.0–149.0)
VLDL: 25.4 mg/dL (ref 0.0–40.0)

## 2013-10-06 LAB — BASIC METABOLIC PANEL
BUN: 14 mg/dL (ref 6–23)
CHLORIDE: 98 meq/L (ref 96–112)
CO2: 29 mEq/L (ref 19–32)
Calcium: 9.8 mg/dL (ref 8.4–10.5)
Creatinine, Ser: 0.6 mg/dL (ref 0.4–1.2)
GFR: 96.92 mL/min (ref >60.00)
Glucose, Bld: 108 mg/dL — ABNORMAL HIGH (ref 70–99)
Potassium: 3.7 mEq/L (ref 3.5–5.1)
Sodium: 133 mEq/L — ABNORMAL LOW (ref 135–145)

## 2013-10-08 NOTE — Telephone Encounter (Signed)
Unread mychart message mailed to patient 

## 2013-11-30 ENCOUNTER — Ambulatory Visit: Payer: Commercial Managed Care - HMO | Admitting: Internal Medicine

## 2013-12-08 ENCOUNTER — Ambulatory Visit (INDEPENDENT_AMBULATORY_CARE_PROVIDER_SITE_OTHER): Payer: Commercial Managed Care - HMO | Admitting: Internal Medicine

## 2013-12-08 ENCOUNTER — Encounter: Payer: Self-pay | Admitting: Internal Medicine

## 2013-12-08 VITALS — BP 120/70 | HR 71 | Temp 97.7°F | Ht 65.5 in | Wt 148.5 lb

## 2013-12-08 DIAGNOSIS — E871 Hypo-osmolality and hyponatremia: Secondary | ICD-10-CM

## 2013-12-08 DIAGNOSIS — J329 Chronic sinusitis, unspecified: Secondary | ICD-10-CM

## 2013-12-08 DIAGNOSIS — M549 Dorsalgia, unspecified: Secondary | ICD-10-CM

## 2013-12-08 DIAGNOSIS — E7800 Pure hypercholesterolemia, unspecified: Secondary | ICD-10-CM

## 2013-12-08 DIAGNOSIS — K219 Gastro-esophageal reflux disease without esophagitis: Secondary | ICD-10-CM

## 2013-12-08 DIAGNOSIS — I10 Essential (primary) hypertension: Secondary | ICD-10-CM

## 2013-12-08 MED ORDER — AMOXICILLIN 875 MG PO TABS: 875.0000 mg | ORAL_TABLET | Freq: Two times a day (BID) | ORAL | Status: DC

## 2013-12-08 NOTE — Progress Notes (Signed)
Subjective:    Patient ID: Janet Moran, female    DOB: [birth year], 80 y.o.   MRN: [id number]  URI   80 year old female with past history of hypertension, hypercholesterolemia and GERD who comes in today for a scheduled follow up.  She states she has been doing relatively well.  Reports starting a few days ago, she started having increased fatigue.  Developed sore throat and increased dripping of her nose.  Increased cough.  Nyquil helped.  No fever.  Head feels full.  No nausea or vomiting.  No abdominal pain or discomfort.  No dysuria or hematuria.   Blood pressure has been doing well.  No significant acid reflux. Takes zyrtec daily.  Bowels are stable.     Past Medical History  Diagnosis Date  . Arthritis   . Chicken pox   . GERD (gastroesophageal reflux disease)   . Glaucoma   . Hypertension   . Hypercholesterolemia     Current Outpatient Prescriptions on File Prior to Visit  Medication Sig Dispense Refill  . amLODipine (NORVASC) 5 MG tablet TAKE ONE (1) TABLET BY MOUTH EVERY DAY  30 tablet  5  . aspirin EC 81 MG tablet Take 81 mg by mouth daily.      . calcium carbonate (OS-CAL) 600 MG TABS Take 600 mg by mouth 2 (two) times daily with a meal. Take 1 tablet by mouth twice daily with food      . dorzolamide-timolol (COSOPT) 22.3-6.8 MG/ML ophthalmic solution       . latanoprost (XALATAN) 0.005 % ophthalmic solution Place 1 drop into both eyes at bedtime.      Marland Kitchen losartan-hydrochlorothiazide (HYZAAR) 100-25 MG per tablet Take 1 tablet by mouth daily.  30 tablet  5  . metoprolol succinate (TOPROL-XL) 50 MG 24 hr tablet TAKE ONE-HALF TABLET DAILY  90 tablet  0  . omeprazole (PRILOSEC) 20 MG capsule Take 20 mg by mouth daily.      . simvastatin (ZOCOR) 20 MG tablet Take 1 tablet (20 mg total) by mouth every evening.  30 tablet  5  . timolol (BETIMOL) 0.25 % ophthalmic solution Place 1-2 drops into the right eye 2 (two) times daily.        No current facility-administered medications  on file prior to visit.    Review of Systems Patient denies any headache, lightheadedness or dizziness.  Head feels full.  Sore throat and nasal dripping and congestion as outlined.   No chest pain, tightness or palpitations.  No increased shortness of breath.  Increased cough.   No nausea or vomiting.   No significant acid reflux.  No abdominal pain or cramping.  No bowel change, such as diarrhea, constipation, BRBPR or melana.  Controls bowels with metamucil and prune juice.   Some fatigue.       Objective:   Physical Exam  Filed Vitals:   12/08/13 1119  BP: 120/70  Pulse: 71  Temp: 97.7 F (58.28 C)   80 year old female in no acute distress.   HEENT:  Nares- erythematous turbinates.   Oropharynx - without lesions.  Minimal tenderness to palpation over the sinus.   NECK:  Supple.  Nontender.  No audible bruit.  HEART:  Appears to be regular. LUNGS:  No crackles or wheezing audible.  Respirations even and unlabored.  RADIAL PULSE:  Equal bilaterally.  ABDOMEN:  Soft, nontender.  Bowel sounds present and normal.  No audible abdominal bruit.   EXTREMITIES:  No increased  edema present.  DP pulses palpable and equal bilaterally.      MSK:  No significant pain in her lower back with straight leg raise.       Assessment & Plan:  HEALTH MAINTENANCE.  Physical 07/28/13.   Mammogram 08/25/12 - Birads II.  Schedule f/u mammogram.   Received shingles vaccine 07/24/11.

## 2013-12-08 NOTE — Progress Notes (Signed)
Pre visit review using our clinic review tool, if applicable. No additional management support is needed unless otherwise documented below in the visit note. 

## 2013-12-08 NOTE — Patient Instructions (Signed)
Increase prilosec to twice a day as directed.   Mucinex DM in the morning and Robitussin DM in the evening.   Nasacort nasal spray - two sprays each nostril one time per day.  Do this in the evening.   Saline nasal spray - flush nose at least 3-4x/day.

## 2013-12-13 ENCOUNTER — Encounter: Payer: Self-pay | Admitting: Internal Medicine

## 2013-12-13 DIAGNOSIS — J329 Chronic sinusitis, unspecified: Secondary | ICD-10-CM | POA: Insufficient documentation

## 2013-12-13 MED ORDER — AMLODIPINE BESYLATE 5 MG PO TABS: 5.0000 mg | ORAL_TABLET | Freq: Every day | ORAL | Status: DC

## 2013-12-13 NOTE — Assessment & Plan Note (Signed)
Blood pressure has been under good control.  Continue same medications.  Follow metabolic panel.   

## 2013-12-13 NOTE — Assessment & Plan Note (Signed)
Symptoms as outlined.  Treat with mucinex and robitussin as directed.  nasacort and saline nasal spray as outlined.  Start with these measures first.  Gave her a prescription for amoxicillin to take if symptoms persist or worsen.  If persistent issues, may require ENT evaluation.

## 2013-12-13 NOTE — Assessment & Plan Note (Signed)
Doing well on current med regimen.  Follow.   

## 2013-12-13 NOTE — Assessment & Plan Note (Signed)
Continue same medication.  Low cholesterol diet and exercise.  Follow lipid profile and liver panel.  10/06/13 cholesterol panel revealed total cholesterol 154, triglycerides 127, HDL 66 and LDL 63.

## 2013-12-13 NOTE — Assessment & Plan Note (Signed)
Has a history of slightly decreased sodium.  Has been stable.  Follow.    

## 2013-12-13 NOTE — Assessment & Plan Note (Signed)
Low back pain.  Flares intermittently.  Stretches.  Has desired no further intervention.  Follow.

## 2013-12-15 ENCOUNTER — Ambulatory Visit (INDEPENDENT_AMBULATORY_CARE_PROVIDER_SITE_OTHER): Payer: Commercial Managed Care - HMO | Admitting: Internal Medicine

## 2013-12-15 ENCOUNTER — Inpatient Hospital Stay: Payer: Self-pay | Admitting: Internal Medicine

## 2013-12-15 ENCOUNTER — Telehealth: Payer: Self-pay | Admitting: Internal Medicine

## 2013-12-15 ENCOUNTER — Encounter: Payer: Self-pay | Admitting: Internal Medicine

## 2013-12-15 VITALS — BP 112/56 | HR 57 | Temp 98.0°F | Resp 14 | Ht 65.5 in | Wt 145.5 lb

## 2013-12-15 DIAGNOSIS — E871 Hypo-osmolality and hyponatremia: Secondary | ICD-10-CM

## 2013-12-15 DIAGNOSIS — I10 Essential (primary) hypertension: Secondary | ICD-10-CM

## 2013-12-15 DIAGNOSIS — J329 Chronic sinusitis, unspecified: Secondary | ICD-10-CM

## 2013-12-15 DIAGNOSIS — R42 Dizziness and giddiness: Secondary | ICD-10-CM

## 2013-12-15 LAB — COMPREHENSIVE METABOLIC PANEL
ALBUMIN: 3.5 g/dL (ref 3.4–5.0)
ALK PHOS: 72 U/L
ALT: 26 U/L
ANION GAP: 11 (ref 7–16)
BUN: 10 mg/dL (ref 7–18)
Bilirubin,Total: 0.8 mg/dL (ref 0.2–1.0)
CREATININE: 0.47 mg/dL — AB (ref 0.60–1.30)
Calcium, Total: 9.1 mg/dL (ref 8.5–10.1)
Chloride: 81 mmol/L — ABNORMAL LOW (ref 98–107)
Co2: 25 mmol/L (ref 21–32)
Glucose: 104 mg/dL — ABNORMAL HIGH (ref 65–99)
OSMOLALITY: 236 (ref 275–301)
POTASSIUM: 3.6 mmol/L (ref 3.5–5.1)
SGOT(AST): 40 U/L — ABNORMAL HIGH (ref 15–37)
Sodium: 117 mmol/L — CL (ref 136–145)
TOTAL PROTEIN: 7.3 g/dL (ref 6.4–8.2)

## 2013-12-15 LAB — URINALYSIS, COMPLETE
Bacteria: NONE SEEN
Bilirubin,UR: NEGATIVE
Blood: NEGATIVE
Glucose,UR: NEGATIVE mg/dL (ref 0–75)
Ketone: NEGATIVE
Leukocyte Esterase: NEGATIVE
NITRITE: NEGATIVE
PH: 7 (ref 4.5–8.0)
Protein: NEGATIVE
Specific Gravity: 1.005 (ref 1.003–1.030)
Squamous Epithelial: <1

## 2013-12-15 LAB — CBC
HCT: 41.2 % (ref 35.0–47.0)
HGB: 14.3 g/dL (ref 12.0–16.0)
MCH: 31.2 pg (ref 26.0–34.0)
MCHC: 34.6 g/dL (ref 32.0–36.0)
MCV: 90 fL (ref 80–100)
PLATELETS: 244 10*3/uL (ref 150–440)
RBC: 4.58 10*6/uL (ref 3.80–5.20)
RDW: 12.7 % (ref 11.5–14.5)
WBC: 6.3 10*3/uL (ref 3.6–11.0)

## 2013-12-15 LAB — TSH: Thyroid Stimulating Horm: 1.79 u[IU]/mL

## 2013-12-15 LAB — OSMOLALITY, URINE: Osmolality: 192 mOsm/kg

## 2013-12-15 LAB — TROPONIN I

## 2013-12-15 LAB — SODIUM, URINE, RANDOM: SODIUM, URINE RANDOM: 23 mmol/L (ref 20–110)

## 2013-12-15 NOTE — Telephone Encounter (Signed)
Patient Information:  Caller Name: Chantil  Phone: [phone]  Patient: Janet Moran, Janet Moran  Gender: Female  DOB: March 27, 1926  Age: 80 Years  PCP: Dale Bent  Office Follow Up:  Does the office need to follow up with this patient?: No  Instructions For The Office: N/A  RN Note:  Patient states she developed sore throat, cough, congestion 12/06/13. Patient states she was seen in the office 12/08/13. Patient was diagnoed with Sinusitis. Patient was prescribed Amoxicillin, Mucinex, Nasocort, saline nasal washes, Robitussin. Patient states sore throat resolved. Patient states cough persists. Patient states she developed dizziness, onset 12/10/13. States dizziness has become progressively worse. Patient states occasional nausea, no appetite. Afebrile. Patient describes "room spinning" that increases with movement of her head. Patient states she feels sinus pain/pressure across her face and nose. States cough is non-productive. Denies wheezing or difficulty breathing.  Care advice given per guidelines. Patient advised to continue medications as prescribed. Patient advised increased fluids, change position slowly, with assistance. Patient advised saline nasal washes/Neti pot, inhaled steam, humidifier, warm compresses to face, warm fluids with honey. Patient advised not to drive self. Call back parameters reviewed. Patient verbalizes understanding. Patient is not alone. No appts. available in Epic Electronic Record. Patient declines an appt. at another office location. RN spoke with Glee Arvin, in office, and informed of above and disposition of "go to office now." Latoya, in office, states she spoke with Dr. Lorin Picket and orders received: Advise patient that office will try to arrange an ENT referral for patient 12/15/13 or patient may be seen in the office at 1200, by Dr. Dale Glenwillow. Advise patient that she may have a wait, in the office, due to being worked into schedule. Patient informed of above.  Patient declines referral to ENT at present. Patient states she prefers to be seen by Dr. Lorin Picket 12/15/13. Appt. scheduled at 1200 12/15/13 with Dr. Lorin Picket, per [facility] in office.   Symptoms  Reason For Call & Symptoms: Dizziness, cough  Reviewed Health History In EMR: Yes  Reviewed Medications In EMR: Yes  Reviewed Allergies In EMR: Yes  Reviewed Surgeries / Procedures: Yes  Date of Onset of Symptoms: 12/10/2013  Guideline(s) Used:  Dizziness  Disposition Per Guideline:   Go to Office Now  Reason For Disposition Reached:   Spinning or tilting sensation (vertigo) present now  Advice Given:  Drink Fluids:  Drink several glasses of fruit juice, other clear fluids, or water. This will improve hydration and blood glucose. If you have a fever or have had heat exposure, make sure the fluids are cold.  Stand Up Slowly:  In the mornings, sit up for a few minutes before you stand up. That will help your blood flow make the adjustment.  If you have to stand up for long periods of time, contract and relax your leg muscles to help pump the blood back to the heart.  Sit down or lie down if you feel dizzy.  Call Back If:  Still feel dizzy after 2 hours of rest and fluids  Passes out (faints)  You become worse.  Patient Will Follow Care Advice:  YES

## 2013-12-15 NOTE — Telephone Encounter (Signed)
Pt called in and stated she saw Dr.Scott last week and stated she is still feeling bad and having dizzyness and was told Dr.Scott didn't have any appts for today and was giving the option for [facility] and husband came on line and stated this is unacceptable  And she needed to be seen today. Was told would send a note and sent her to triage nurse.

## 2013-12-15 NOTE — Progress Notes (Signed)
Pre visit review using our clinic review tool, if applicable. No additional management support is needed unless otherwise documented below in the visit note. 

## 2013-12-16 ENCOUNTER — Telehealth: Payer: Self-pay | Admitting: *Deleted

## 2013-12-16 ENCOUNTER — Encounter: Payer: Self-pay | Admitting: Internal Medicine

## 2013-12-16 LAB — BASIC METABOLIC PANEL
ANION GAP: 10 (ref 7–16)
BUN: 9 mg/dL (ref 7–18)
CALCIUM: 8.8 mg/dL (ref 8.5–10.1)
CO2: 27 mmol/L (ref 21–32)
Chloride: 88 mmol/L — ABNORMAL LOW (ref 98–107)
Creatinine: 0.67 mg/dL (ref 0.60–1.30)
Glucose: 97 mg/dL (ref 65–99)
OSMOLALITY: 250 (ref 275–301)
Potassium: 2.9 mmol/L — ABNORMAL LOW (ref 3.5–5.1)
Sodium: 125 mmol/L — ABNORMAL LOW (ref 136–145)

## 2013-12-16 LAB — CBC WITH DIFFERENTIAL/PLATELET
BASOS ABS: 0.1 10*3/uL (ref 0.0–0.1)
Basophil %: 0.9 %
EOS ABS: 0.1 10*3/uL (ref 0.0–0.7)
EOS PCT: 1.7 %
HCT: 37.7 % (ref 35.0–47.0)
HGB: 13.4 g/dL (ref 12.0–16.0)
LYMPHS PCT: 21.7 %
Lymphocyte #: 1.4 10*3/uL (ref 1.0–3.6)
MCH: 31.7 pg (ref 26.0–34.0)
MCHC: 35.5 g/dL (ref 32.0–36.0)
MCV: 89 fL (ref 80–100)
Monocyte #: 0.9 x10 3/mm (ref 0.2–0.9)
Monocyte %: 14.1 %
Neutrophil #: 4 10*3/uL (ref 1.4–6.5)
Neutrophil %: 61.6 %
Platelet: 215 10*3/uL (ref 150–440)
RBC: 4.23 10*6/uL (ref 3.80–5.20)
RDW: 12.8 % (ref 11.5–14.5)
WBC: 6.5 10*3/uL (ref 3.6–11.0)

## 2013-12-16 LAB — MAGNESIUM: MAGNESIUM: 2.1 mg/dL

## 2013-12-16 NOTE — Telephone Encounter (Signed)
Thanks for the update. Keep us posted.  

## 2013-12-16 NOTE — Progress Notes (Signed)
Subjective:    Patient ID: Janet Moran, female    DOB: Jul 04, 1925, 80 y.o.   MRN: [id number]  Dizziness  URI   80 year old female with past history of hypertension, hypercholesterolemia and GERD who comes in today as a work in with concerns regarding weakness and dizziness.  She was seen last week and treated for sinus infection.  She has been taking mucinex, robitussin and started amoxicillin.  The congestion seems better, but she is weak.  Started several days ago.  Very tired.  Feels weak, especially with sitting and standing.  No headache.  No chest congestion or sob.  Had one episode of emesis.  No significant nausea.  If she lies on her right side, ear stopped up.  No room spinning.  Just feels weak and more light headed.     Past Medical History  Diagnosis Date  . Arthritis   . Chicken pox   . GERD (gastroesophageal reflux disease)   . Glaucoma   . Hypertension   . Hypercholesterolemia     Current Outpatient Prescriptions on File Prior to Visit  Medication Sig Dispense Refill  . amLODipine (NORVASC) 5 MG tablet Take 1 tablet (5 mg total) by mouth daily.  30 tablet  5  . amoxicillin (AMOXIL) 875 MG tablet Take 1 tablet (875 mg total) by mouth 2 (two) times daily.  20 tablet  0  . aspirin EC 81 MG tablet Take 81 mg by mouth daily.      . calcium carbonate (OS-CAL) 600 MG TABS Take 600 mg by mouth 2 (two) times daily with a meal. Take 1 tablet by mouth twice daily with food      . dorzolamide-timolol (COSOPT) 22.3-6.8 MG/ML ophthalmic solution       . latanoprost (XALATAN) 0.005 % ophthalmic solution Place 1 drop into both eyes at bedtime.      Marland Kitchen losartan-hydrochlorothiazide (HYZAAR) 100-25 MG per tablet Take 1 tablet by mouth daily.  30 tablet  5  . metoprolol succinate (TOPROL-XL) 50 MG 24 hr tablet TAKE ONE-HALF TABLET DAILY  90 tablet  0  . naproxen sodium (ANAPROX) 220 MG tablet Take 220 mg by mouth daily.      Marland Kitchen omeprazole (PRILOSEC) 20 MG capsule Take 20 mg by mouth  daily.      . simvastatin (ZOCOR) 20 MG tablet Take 1 tablet (20 mg total) by mouth every evening.  30 tablet  5  . timolol (BETIMOL) 0.25 % ophthalmic solution Place 1-2 drops into the right eye 2 (two) times daily.        No current facility-administered medications on file prior to visit.    Review of Systems  Neurological: Positive for dizziness.  Patient denies any headache.  Does report the light headedness as outlined.  No actual room spinning.   Sore throat and nasal dripping and congestion are better.  Weak. Fatigued.  States wants to sleep all of the time.  Feels better lying down.   No chest pain, tightness or palpitations.  No increased shortness of breath.  Still with some cough.  No nausea or vomiting now.  Previously had one episode of emesis.  States she is eating and drinking.  Decreased po intake.   No significant acid reflux.  No abdominal pain or cramping.  No bowel change, such as diarrhea, constipation, BRBPR or melana.        Objective:   Physical Exam  Filed Vitals:   12/15/13 1217  BP: 112/56  Pulse: 57  Temp: 98 F (36.7 C)  Resp: 14   Blood pressure recheck:  98/52 standing and 128/60 lying.   80 year old female in no acute distress.   HEENT:  Nares- erythematous turbinates.   Oropharynx - without lesions.  Cerumen present.   NECK:  Supple.  Nontender.  No audible bruit.  HEART:  Appears to be regular. LUNGS:  No crackles or wheezing audible.  Respirations even and unlabored.  RADIAL PULSE:  Equal bilaterally.  ABDOMEN:  Soft, nontender.  Bowel sounds present and normal.  No audible abdominal bruit.   EXTREMITIES:  No increased edema present.  DP pulses palpable and equal bilaterally.      Assessment & Plan:   I spent 25 minutes with the patient and more than 50% of the time was spent in consultation regarding the above.

## 2013-12-16 NOTE — Telephone Encounter (Signed)
Pt was admitted yesterday. Sodium low, had chest x-ray, & scheduled for MRI Brain this morning.

## 2013-12-17 ENCOUNTER — Encounter: Payer: Self-pay | Admitting: Internal Medicine

## 2013-12-17 ENCOUNTER — Telehealth: Payer: Self-pay | Admitting: Internal Medicine

## 2013-12-17 DIAGNOSIS — R42 Dizziness and giddiness: Secondary | ICD-10-CM | POA: Insufficient documentation

## 2013-12-17 LAB — POTASSIUM
POTASSIUM: 3.5 mmol/L (ref 3.5–5.1)
Potassium: 2.9 mmol/L — ABNORMAL LOW (ref 3.5–5.1)

## 2013-12-17 LAB — SODIUM
SODIUM: 132 mmol/L — AB (ref 136–145)
Sodium: 129 mmol/L — ABNORMAL LOW (ref 136–145)

## 2013-12-17 NOTE — Telephone Encounter (Signed)
Please see below note

## 2013-12-17 NOTE — Assessment & Plan Note (Signed)
Sinus symptoms have improved.

## 2013-12-17 NOTE — Assessment & Plan Note (Signed)
Describes the weakness, light headedness and dizziness as outlined.  Does not appear to be c/w vertigo.  No room spinning.  She is orthostatic on exam.  Decreased po intake.  Sinus symptoms are better.  Has a history of hyponatremia.  With decreased po intake, etc, I am concerned sodium low.  Unable to stand for any length of time (without feeling the increased weakness).  Given theses symptoms and orthostatic blood pressure, I do feel she needs further evaluation in the ER/hospital.  Discussed with pt and her daughter (and husband).  They are in agreement.  ER notified.

## 2013-12-17 NOTE — Telephone Encounter (Signed)
[facility] ED called in and stated patient was in for hyponatremia and is needing a 1-2 week hospital follow up.

## 2013-12-17 NOTE — Assessment & Plan Note (Signed)
Orthostatic on exam.  Transfer to ER/hospital for further evaluation and treatment (including IVFs, etc).  Hold BP meds for now.

## 2013-12-17 NOTE — Telephone Encounter (Signed)
Closed in error-see below 

## 2013-12-17 NOTE — Assessment & Plan Note (Signed)
With the decrease in po intake, etc, I am concerned sodium may be low.  Discussed transfer to ER/hospital for further w/up, evaluation and IVFs.   She (and family) are in agreement.  ER notified.

## 2013-12-18 ENCOUNTER — Telehealth: Payer: Self-pay | Admitting: *Deleted

## 2013-12-18 NOTE — Telephone Encounter (Signed)
ER records & discharge summary requested

## 2013-12-18 NOTE — Telephone Encounter (Signed)
ER records received & placed in green folder-copy of d/c summary given to [facility] for f/u phone call protocol. Raynelle Fanning will also inform patient of appointment.

## 2013-12-18 NOTE — Telephone Encounter (Signed)
Discharged: 12/17/13 [facility])   Transition Care Management Follow-up Telephone Call  How have you been since you were released from the hospital? Per pt "feels good to be home, I was active this morning, so taking it easy this afternoon."    Do you understand why you were in the hospital? YES   Do you understand the discharge instrcutions? YES  Items Reviewed:  Medications reviewed: YES  Allergies reviewed: YES  Dietary changes reviewed: N/A  Referrals reviewed: N/A   Functional Questionnaire:   Activities of Daily Living (ADLs):   She states they are independent in the following:  States they require assistance with the following:   Any transportation issues/concerns?: No   Any patient concerns? No   Confirmed importance and date/time of follow-up visits scheduled: YES, 12/22/13 @ 11:45   Confirmed with patient if condition begins to worsen call PCP or go to the ER.  Patient was given the Call-a-Nurse line [phone]: YES

## 2013-12-18 NOTE — Telephone Encounter (Signed)
Noted  

## 2013-12-18 NOTE — Telephone Encounter (Signed)
I can see her on 12/22/13 at 11:45 for hospital f/u.  We need to call her and document in chart what is needed for hospital f/u appt.  Thanks.

## 2013-12-22 ENCOUNTER — Encounter: Payer: Self-pay | Admitting: Internal Medicine

## 2013-12-22 ENCOUNTER — Ambulatory Visit (INDEPENDENT_AMBULATORY_CARE_PROVIDER_SITE_OTHER): Payer: Commercial Managed Care - HMO | Admitting: Internal Medicine

## 2013-12-22 VITALS — BP 128/56 | HR 62 | Temp 98.1°F | Resp 14 | Ht 65.5 in | Wt 147.0 lb

## 2013-12-22 DIAGNOSIS — R945 Abnormal results of liver function studies: Secondary | ICD-10-CM

## 2013-12-22 DIAGNOSIS — E871 Hypo-osmolality and hyponatremia: Secondary | ICD-10-CM

## 2013-12-22 DIAGNOSIS — R05 Cough: Secondary | ICD-10-CM

## 2013-12-22 DIAGNOSIS — R7989 Other specified abnormal findings of blood chemistry: Secondary | ICD-10-CM

## 2013-12-22 DIAGNOSIS — R059 Cough, unspecified: Secondary | ICD-10-CM

## 2013-12-22 DIAGNOSIS — I10 Essential (primary) hypertension: Secondary | ICD-10-CM

## 2013-12-22 DIAGNOSIS — J329 Chronic sinusitis, unspecified: Secondary | ICD-10-CM

## 2013-12-22 LAB — BASIC METABOLIC PANEL
BUN: 8 mg/dL (ref 6–23)
CO2: 27 meq/L (ref 19–32)
Calcium: 9.6 mg/dL (ref 8.4–10.5)
Chloride: 96 mEq/L (ref 96–112)
Creatinine, Ser: 0.6 mg/dL (ref 0.4–1.2)
GFR: 104.62 mL/min (ref >60.00)
GLUCOSE: 92 mg/dL (ref 70–99)
POTASSIUM: 4.6 meq/L (ref 3.5–5.1)
Sodium: 130 mEq/L — ABNORMAL LOW (ref 135–145)

## 2013-12-22 LAB — HEPATIC FUNCTION PANEL
ALT: 22 U/L (ref 0–35)
AST: 19 U/L (ref 0–37)
Albumin: 3.6 g/dL (ref 3.5–5.2)
Alkaline Phosphatase: 65 U/L (ref 39–117)
BILIRUBIN DIRECT: 0.1 mg/dL (ref 0.0–0.3)
Total Bilirubin: 0.5 mg/dL (ref 0.2–1.2)
Total Protein: 6.1 g/dL (ref 6.0–8.3)

## 2013-12-22 MED ORDER — LOSARTAN POTASSIUM 50 MG PO TABS: 50.0000 mg | ORAL_TABLET | Freq: Every day | ORAL | Status: DC

## 2013-12-22 NOTE — Patient Instructions (Signed)
Stop the losartan/hctz and take losartan  per day.    Zyrtec suspension ( ) per day as directed.

## 2013-12-22 NOTE — Progress Notes (Signed)
Pre visit review using our clinic review tool, if applicable. No additional management support is needed unless otherwise documented below in the visit note. 

## 2013-12-23 ENCOUNTER — Encounter: Payer: Self-pay | Admitting: Internal Medicine

## 2013-12-23 ENCOUNTER — Other Ambulatory Visit: Payer: Self-pay | Admitting: Internal Medicine

## 2013-12-23 DIAGNOSIS — E871 Hypo-osmolality and hyponatremia: Secondary | ICD-10-CM

## 2013-12-23 NOTE — Progress Notes (Signed)
Order placed for f/u sodium.  ?

## 2013-12-27 ENCOUNTER — Encounter: Payer: Self-pay | Admitting: Internal Medicine

## 2013-12-27 DIAGNOSIS — R7989 Other specified abnormal findings of blood chemistry: Secondary | ICD-10-CM | POA: Insufficient documentation

## 2013-12-27 DIAGNOSIS — R945 Abnormal results of liver function studies: Secondary | ICD-10-CM | POA: Insufficient documentation

## 2013-12-27 DIAGNOSIS — R059 Cough, unspecified: Secondary | ICD-10-CM | POA: Insufficient documentation

## 2013-12-27 DIAGNOSIS — R05 Cough: Secondary | ICD-10-CM | POA: Insufficient documentation

## 2013-12-27 NOTE — Assessment & Plan Note (Signed)
Admitted with low sodium (117).  IV NS in hospital.  Sodium improved.  Medication adjusted.  Off diuretic.  Eating better.  Recheck sodium today.

## 2013-12-27 NOTE — Progress Notes (Signed)
  Subjective:    Patient ID: Janet Moran, female    DOB: [birth year], 80 y.o.   MRN: [id number]  HPI 80 year old female with past history of hypertension, hypercholesterolemia and GERD who comes in today for a hospital follow up.   Was admitted 12/15/13 with dizziness, hyponatremia and weakness.  Sodium found to be 117.  She was given IV saline.  Sodium improved.  Discharge sodium 129.  Comes in today accompanied by her daughter.  History obtained from both of them.  Feels better.  Is eating better.  Still with decreased energy.  No nausea or vomiting.  Sinus symptoms better.  Some residual cough.  No sob.  No chest pain or tightness.  No dizziness.     Past Medical History  Diagnosis Date  . Arthritis   . Chicken pox   . GERD (gastroesophageal reflux disease)   . Glaucoma   . Hypertension   . Hypercholesterolemia     Current Outpatient Prescriptions on File Prior to Visit  Medication Sig Dispense Refill  . amLODipine (NORVASC) 5 MG tablet Take 1 tablet (5 mg total) by mouth daily.  30 tablet  5  . aspirin EC 81 MG tablet Take 81 mg by mouth daily.      . calcium carbonate (OS-CAL) 600 MG TABS Take 600 mg by mouth 2 (two) times daily with a meal. Take 1 tablet by mouth twice daily with food      . dorzolamide-timolol (COSOPT) 22.3-6.8 MG/ML ophthalmic solution       . latanoprost (XALATAN) 0.005 % ophthalmic solution Place 1 drop into both eyes at bedtime.      . metoprolol succinate (TOPROL-XL) 50 MG 24 hr tablet TAKE ONE-HALF TABLET DAILY  90 tablet  0  . omeprazole (PRILOSEC) 20 MG capsule Take 20 mg by mouth daily.      . simvastatin (ZOCOR) 20 MG tablet Take 1 tablet (20 mg total) by mouth every evening.  30 tablet  5  . timolol (BETIMOL) 0.25 % ophthalmic solution Place 1-2 drops into the right eye 2 (two) times daily.        No current facility-administered medications on file prior to visit.    Review of Systems Patient denies any headache, lightheadedness or dizziness now.   No significant allergy or sinus symptoms.   No chest pain, tightness or palpitations.  No increased shortness of breath.  Cough is better.  Still present.   No nausea or vomiting.   No acid reflux.  No abdominal pain or cramping.  No bowel change, such as diarrhea.  Still with fatigue.  Feels better.  Eating better.         Objective:   Physical Exam  Filed Vitals:   12/22/13 1205  BP: 128/56  Pulse: 62  Temp: 98.1 F (36.7 C)  Resp: 61   80 year old female in no acute distress.   HEENT:  Nares- clear.  Oropharynx - without lesions. NECK:  Supple.  Nontender.  No audible bruit.  HEART:  Appears to be regular. LUNGS:  No crackles or wheezing audible.  Respirations even and unlabored.  RADIAL PULSE:  Equal bilaterally.  ABDOMEN:  Soft, nontender.  Bowel sounds present and normal.  No audible abdominal bruit.  EXTREMITIES:  No increased edema present.  DP pulses palpable and equal bilaterally.      Assessment & Plan:  COUGH.  Better.  Lungs clear.  Follow.

## 2013-12-27 NOTE — Assessment & Plan Note (Signed)
Better, cut persistent.  May be allergy triggered.  Follow.

## 2013-12-27 NOTE — Assessment & Plan Note (Signed)
Noted in the hospital.  Recheck today to confirm normal.

## 2013-12-27 NOTE — Assessment & Plan Note (Signed)
Symptoms improved.  Follow.  

## 2013-12-27 NOTE — Assessment & Plan Note (Signed)
Blood pressure doing well now.  Off diuretic.  Follow.

## 2013-12-28 ENCOUNTER — Ambulatory Visit (INDEPENDENT_AMBULATORY_CARE_PROVIDER_SITE_OTHER): Payer: Commercial Managed Care - HMO | Admitting: Podiatry

## 2013-12-28 DIAGNOSIS — M79609 Pain in unspecified limb: Secondary | ICD-10-CM

## 2013-12-28 DIAGNOSIS — B351 Tinea unguium: Secondary | ICD-10-CM | POA: Diagnosis not present

## 2013-12-28 DIAGNOSIS — M79673 Pain in unspecified foot: Secondary | ICD-10-CM

## 2013-12-28 NOTE — Progress Notes (Signed)
She presents today for routine nail debridement complaining of painful elongated toenails.  Objective:

## 2013-12-31 ENCOUNTER — Other Ambulatory Visit: Payer: Self-pay | Admitting: Internal Medicine

## 2013-12-31 ENCOUNTER — Other Ambulatory Visit (INDEPENDENT_AMBULATORY_CARE_PROVIDER_SITE_OTHER): Payer: Commercial Managed Care - HMO

## 2013-12-31 DIAGNOSIS — E871 Hypo-osmolality and hyponatremia: Secondary | ICD-10-CM

## 2013-12-31 DIAGNOSIS — I10 Essential (primary) hypertension: Secondary | ICD-10-CM

## 2013-12-31 LAB — SODIUM: Sodium: 131 mEq/L — ABNORMAL LOW (ref 135–145)

## 2013-12-31 NOTE — Progress Notes (Signed)
Order placed for f/u labs.  

## 2014-01-01 ENCOUNTER — Other Ambulatory Visit: Payer: Commercial Managed Care - HMO

## 2014-01-01 ENCOUNTER — Encounter: Payer: Self-pay | Admitting: *Deleted

## 2014-01-04 NOTE — Telephone Encounter (Signed)
Unread mychart message mailed to patient 

## 2014-01-15 ENCOUNTER — Telehealth: Payer: Self-pay | Admitting: Internal Medicine

## 2014-01-15 ENCOUNTER — Other Ambulatory Visit (INDEPENDENT_AMBULATORY_CARE_PROVIDER_SITE_OTHER): Payer: Commercial Managed Care - HMO

## 2014-01-15 DIAGNOSIS — E871 Hypo-osmolality and hyponatremia: Secondary | ICD-10-CM

## 2014-01-15 DIAGNOSIS — I10 Essential (primary) hypertension: Secondary | ICD-10-CM

## 2014-01-15 LAB — BASIC METABOLIC PANEL
BUN: 11 mg/dL (ref 6–23)
CALCIUM: 9.5 mg/dL (ref 8.4–10.5)
CO2: 27 mEq/L (ref 19–32)
CREATININE: 0.6 mg/dL (ref 0.4–1.2)
Chloride: 102 mEq/L (ref 96–112)
GFR: 98.69 mL/min (ref >60.00)
Glucose, Bld: 67 mg/dL — ABNORMAL LOW (ref 70–99)
Potassium: 3.4 mEq/L — ABNORMAL LOW (ref 3.5–5.1)
Sodium: 134 mEq/L — ABNORMAL LOW (ref 135–145)

## 2014-01-15 NOTE — Telephone Encounter (Signed)
Pt advised that she had her flu shot 1 month ago while she was in the hospital.msn

## 2014-01-15 NOTE — Telephone Encounter (Signed)
This has already been documented

## 2014-01-20 ENCOUNTER — Encounter: Payer: Self-pay | Admitting: Internal Medicine

## 2014-01-20 ENCOUNTER — Encounter: Payer: Self-pay | Admitting: *Deleted

## 2014-02-01 ENCOUNTER — Telehealth: Payer: Self-pay | Admitting: *Deleted

## 2014-02-01 ENCOUNTER — Other Ambulatory Visit (INDEPENDENT_AMBULATORY_CARE_PROVIDER_SITE_OTHER): Payer: Commercial Managed Care - HMO

## 2014-02-01 DIAGNOSIS — E876 Hypokalemia: Secondary | ICD-10-CM

## 2014-02-01 DIAGNOSIS — E871 Hypo-osmolality and hyponatremia: Secondary | ICD-10-CM

## 2014-02-01 LAB — BASIC METABOLIC PANEL
BUN: 11 mg/dL (ref 6–23)
CO2: 22 mEq/L (ref 19–32)
Calcium: 9.4 mg/dL (ref 8.4–10.5)
Chloride: 103 mEq/L (ref 96–112)
Creatinine, Ser: 0.6 mg/dL (ref 0.4–1.2)
GFR: 93.36 mL/min (ref >60.00)
Glucose, Bld: 75 mg/dL (ref 70–99)
POTASSIUM: 3.8 meq/L (ref 3.5–5.1)
SODIUM: 136 meq/L (ref 135–145)

## 2014-02-01 NOTE — Telephone Encounter (Signed)
Order placed for met b 

## 2014-02-01 NOTE — Telephone Encounter (Signed)
What labs and dx?  

## 2014-02-02 ENCOUNTER — Encounter: Payer: Self-pay | Admitting: Internal Medicine

## 2014-02-04 NOTE — Telephone Encounter (Signed)
Unread mychart message mailed to patient 

## 2014-03-08 ENCOUNTER — Telehealth: Payer: Self-pay | Admitting: *Deleted

## 2014-03-08 DIAGNOSIS — E7800 Pure hypercholesterolemia, unspecified: Secondary | ICD-10-CM

## 2014-03-08 DIAGNOSIS — E871 Hypo-osmolality and hyponatremia: Secondary | ICD-10-CM

## 2014-03-08 DIAGNOSIS — R945 Abnormal results of liver function studies: Secondary | ICD-10-CM

## 2014-03-08 DIAGNOSIS — R7989 Other specified abnormal findings of blood chemistry: Secondary | ICD-10-CM

## 2014-03-08 DIAGNOSIS — K219 Gastro-esophageal reflux disease without esophagitis: Secondary | ICD-10-CM

## 2014-03-08 DIAGNOSIS — I10 Essential (primary) hypertension: Secondary | ICD-10-CM

## 2014-03-08 NOTE — Telephone Encounter (Signed)
Pt is coming in tomorrow what labs and dx?  

## 2014-03-08 NOTE — Telephone Encounter (Signed)
Orders placed for labs

## 2014-03-09 ENCOUNTER — Other Ambulatory Visit (INDEPENDENT_AMBULATORY_CARE_PROVIDER_SITE_OTHER): Payer: Commercial Managed Care - HMO

## 2014-03-09 DIAGNOSIS — R7989 Other specified abnormal findings of blood chemistry: Secondary | ICD-10-CM

## 2014-03-09 DIAGNOSIS — I10 Essential (primary) hypertension: Secondary | ICD-10-CM

## 2014-03-09 DIAGNOSIS — E7800 Pure hypercholesterolemia, unspecified: Secondary | ICD-10-CM

## 2014-03-09 DIAGNOSIS — K219 Gastro-esophageal reflux disease without esophagitis: Secondary | ICD-10-CM

## 2014-03-09 DIAGNOSIS — R945 Abnormal results of liver function studies: Secondary | ICD-10-CM

## 2014-03-09 DIAGNOSIS — E871 Hypo-osmolality and hyponatremia: Secondary | ICD-10-CM

## 2014-03-09 LAB — CBC WITH DIFFERENTIAL/PLATELET
Basophils Absolute: 0.1 10*3/uL (ref 0.0–0.1)
Basophils Relative: 1.5 % (ref 0.0–3.0)
EOS PCT: 4.1 % (ref 0.0–5.0)
Eosinophils Absolute: 0.2 10*3/uL (ref 0.0–0.7)
HEMATOCRIT: 40.2 % (ref 36.0–46.0)
Hemoglobin: 13.4 g/dL (ref 12.0–15.0)
LYMPHS ABS: 1.3 10*3/uL (ref 0.7–4.0)
LYMPHS PCT: 29.9 % (ref 12.0–46.0)
MCHC: 33.3 g/dL (ref 30.0–36.0)
MCV: 94.5 fl (ref 78.0–100.0)
MONOS PCT: 7.7 % (ref 3.0–12.0)
Monocytes Absolute: 0.3 10*3/uL (ref 0.1–1.0)
Neutro Abs: 2.5 10*3/uL (ref 1.4–7.7)
Neutrophils Relative %: 56.8 % (ref 43.0–77.0)
PLATELETS: 189 10*3/uL (ref 150.0–400.0)
RBC: 4.26 Mil/uL (ref 3.87–5.11)
RDW: 13.8 % (ref 11.5–15.5)
WBC: 4.4 10*3/uL (ref 4.0–10.5)

## 2014-03-09 LAB — TSH: TSH: 3.52 u[IU]/mL (ref 0.35–4.50)

## 2014-03-09 NOTE — Addendum Note (Signed)
Addended by: Cristela BluePULLIAM, Chaela Branscum W on: 03/09/2014 09:21 AM   Modules accepted: Orders

## 2014-03-10 LAB — BASIC METABOLIC PANEL
BUN: 13 mg/dL (ref 6–23)
CALCIUM: 9.2 mg/dL (ref 8.4–10.5)
CO2: 27 mEq/L (ref 19–32)
CREATININE: 0.6 mg/dL (ref 0.4–1.2)
Chloride: 105 mEq/L (ref 96–112)
GFR: 100.56 mL/min (ref >60.00)
Glucose, Bld: 94 mg/dL (ref 70–99)
Potassium: 3.8 mEq/L (ref 3.5–5.1)
SODIUM: 136 meq/L (ref 135–145)

## 2014-03-10 LAB — LIPID PANEL
Cholesterol: 173 mg/dL (ref 0–200)
HDL: 55 mg/dL (ref >39.00)
LDL Cholesterol: 94 mg/dL (ref 0–99)
NonHDL: 118
TRIGLYCERIDES: 118 mg/dL (ref 0.0–149.0)
Total CHOL/HDL Ratio: 3
VLDL: 23.6 mg/dL (ref 0.0–40.0)

## 2014-03-10 LAB — HEPATIC FUNCTION PANEL
ALBUMIN: 3.7 g/dL (ref 3.5–5.2)
ALT: 27 U/L (ref 0–35)
AST: 25 U/L (ref 0–37)
Alkaline Phosphatase: 71 U/L (ref 39–117)
Bilirubin, Direct: 0.1 mg/dL (ref 0.0–0.3)
Total Bilirubin: 0.6 mg/dL (ref 0.2–1.2)
Total Protein: 5.8 g/dL — ABNORMAL LOW (ref 6.0–8.3)

## 2014-03-11 ENCOUNTER — Encounter: Payer: Self-pay | Admitting: Internal Medicine

## 2014-03-11 ENCOUNTER — Ambulatory Visit (INDEPENDENT_AMBULATORY_CARE_PROVIDER_SITE_OTHER): Payer: Commercial Managed Care - HMO | Admitting: Internal Medicine

## 2014-03-11 VITALS — BP 128/80 | HR 54 | Temp 98.0°F | Ht 65.5 in | Wt 147.8 lb

## 2014-03-11 DIAGNOSIS — I10 Essential (primary) hypertension: Secondary | ICD-10-CM

## 2014-03-11 DIAGNOSIS — E7800 Pure hypercholesterolemia, unspecified: Secondary | ICD-10-CM

## 2014-03-11 DIAGNOSIS — R945 Abnormal results of liver function studies: Secondary | ICD-10-CM

## 2014-03-11 DIAGNOSIS — Z1239 Encounter for other screening for malignant neoplasm of breast: Secondary | ICD-10-CM

## 2014-03-11 DIAGNOSIS — Z91048 Other nonmedicinal substance allergy status: Secondary | ICD-10-CM

## 2014-03-11 DIAGNOSIS — Z9109 Other allergy status, other than to drugs and biological substances: Secondary | ICD-10-CM

## 2014-03-11 DIAGNOSIS — R7989 Other specified abnormal findings of blood chemistry: Secondary | ICD-10-CM

## 2014-03-11 DIAGNOSIS — E871 Hypo-osmolality and hyponatremia: Secondary | ICD-10-CM

## 2014-03-11 DIAGNOSIS — K219 Gastro-esophageal reflux disease without esophagitis: Secondary | ICD-10-CM

## 2014-03-11 MED ORDER — SIMVASTATIN 20 MG PO TABS: 20.0000 mg | ORAL_TABLET | Freq: Every evening | ORAL | Status: DC

## 2014-03-11 MED ORDER — METOPROLOL SUCCINATE ER 50 MG PO TB24: 25.0000 mg | ORAL_TABLET | Freq: Every day | ORAL | Status: DC

## 2014-03-11 NOTE — Progress Notes (Signed)
Pre visit review using our clinic review tool, if applicable. No additional management support is needed unless otherwise documented below in the visit note. 

## 2014-03-14 ENCOUNTER — Encounter: Payer: Self-pay | Admitting: Internal Medicine

## 2014-03-14 DIAGNOSIS — Z9109 Other allergy status, other than to drugs and biological substances: Secondary | ICD-10-CM | POA: Insufficient documentation

## 2014-03-14 NOTE — Progress Notes (Signed)
Subjective:    Patient ID: Janet Moran, female    DOB: [birth year], 80 y.o.   MRN: [id number]  HPI 80 year old female with past history of hypertension, hypercholesterolemia and GERD who comes in today for a scheduled follow up.  Was admitted 12/15/13 with dizziness, hyponatremia and weakness.  Sodium found to be 117.  She was given IV saline.  Sodium improved.  Discharge sodium 129. Since her discharge she has done well.  Sodium normal.  Feels better.  Is eating better.  Still with some decreased energy, but overall feels things are stable and at her baseline.  No nausea or vomiting.  Some residual cough with allergy symptoms.  No sob.  No chest pain or tightness.  No dizziness.     Past Medical History  Diagnosis Date  . Arthritis   . Chicken pox   . GERD (gastroesophageal reflux disease)   . Glaucoma   . Hypertension   . Hypercholesterolemia     Current Outpatient Prescriptions on File Prior to Visit  Medication Sig Dispense Refill  . amLODipine (NORVASC) 5 MG tablet Take 1 tablet (5 mg total) by mouth daily. 30 tablet 5  . aspirin EC 81 MG tablet Take 81 mg by mouth daily.    . calcium carbonate (OS-CAL) 600 MG TABS Take 600 mg by mouth 2 (two) times daily with a meal. Take 1 tablet by mouth twice daily with food    . latanoprost (XALATAN) 0.005 % ophthalmic solution Place 1 drop into both eyes at bedtime.    Marland Kitchen. losartan (COZAAR) 50 MG tablet Take 1 tablet (50 mg total) by mouth daily. 30 tablet 3  . omeprazole (PRILOSEC) 20 MG capsule Take 20 mg by mouth daily.    . timolol (BETIMOL) 0.25 % ophthalmic solution Place 1-2 drops into the right eye 2 (two) times daily.     Marland Kitchen. amoxicillin (AMOXIL) 875 MG tablet Prior to dental procedures     No current facility-administered medications on file prior to visit.    Review of Systems Patient denies any headache, lightheadedness or dizziness now.  Some allergy symptoms.   No chest pain, tightness or palpitations.  No increased shortness of  breath.  Cough is better.  Still present.   No nausea or vomiting.   No acid reflux.  No abdominal pain or cramping.  No bowel change, such as diarrhea.  Still with fatigue.  Feels better.  Eating better.         Objective:   Physical Exam  Filed Vitals:   03/11/14 1104  BP: 128/80  Pulse: 54  Temp: 98 F (3336.517 C)   80 year old female in no acute distress.   HEENT:  Nares- clear.  Oropharynx - without lesions. NECK:  Supple.  Nontender.  No audible bruit.  HEART:  Appears to be regular. LUNGS:  No crackles or wheezing audible.  Respirations even and unlabored.  RADIAL PULSE:  Equal bilaterally.  ABDOMEN:  Soft, nontender.  Bowel sounds present and normal.  No audible abdominal bruit.  EXTREMITIES:  No increased edema present.  DP pulses palpable and equal bilaterally.      Assessment & Plan:  1. Breast cancer screening - MM DIGITAL SCREENING BILATERAL; Future  2. Essential hypertension Blood pressure has been doing well.  Follow.   - Basic metabolic panel; Future  3. Gastroesophageal reflux disease, esophagitis presence not specified Controlled on prilosec.   4. Hypercholesterolemia Low cholesterol diet and exercise.  On simvastatin.  -  Lipid panel; Future - Hepatic function panel; Future  5. Hyponatremia Last sodium check wnl.   6. Abnormal liver function test Last liver panel check wnl.    7. Environmental allergies Continue nasal spray and antihistamine.    8. COUGH.  Better.  Lungs clear.  Follow.   HEALTH MAINTENANCE.  Physical 07/28/13.  Mammogram overdue.  Schedule.

## 2014-03-22 ENCOUNTER — Ambulatory Visit (INDEPENDENT_AMBULATORY_CARE_PROVIDER_SITE_OTHER): Payer: Commercial Managed Care - HMO | Admitting: Podiatry

## 2014-03-22 DIAGNOSIS — M79673 Pain in unspecified foot: Secondary | ICD-10-CM

## 2014-03-22 DIAGNOSIS — B351 Tinea unguium: Secondary | ICD-10-CM | POA: Diagnosis not present

## 2014-03-22 NOTE — Progress Notes (Signed)
She presents today complaining of painful E. elongated toenails.  Objective: Pulses are palpable bilateral. Nails are thick yellow dystrophic onychomycotic and painful palpation.  Assessment: Pain in limb secondary to onychomycosis 1 through 5 bilateral.  Plan: Debridement of nails 1 through 5 bilateral covered service secondary to pain. 

## 2014-03-29 ENCOUNTER — Ambulatory Visit: Payer: Commercial Managed Care - HMO | Admitting: Podiatry

## 2014-04-28 ENCOUNTER — Ambulatory Visit: Payer: Self-pay | Admitting: Internal Medicine

## 2014-04-28 LAB — HM MAMMOGRAPHY: HM Mammogram: NEGATIVE

## 2014-05-14 ENCOUNTER — Other Ambulatory Visit: Payer: Self-pay | Admitting: Internal Medicine

## 2014-05-25 ENCOUNTER — Other Ambulatory Visit: Payer: Self-pay | Admitting: Internal Medicine

## 2014-06-28 ENCOUNTER — Ambulatory Visit (INDEPENDENT_AMBULATORY_CARE_PROVIDER_SITE_OTHER): Payer: PPO | Admitting: Podiatry

## 2014-06-28 DIAGNOSIS — B351 Tinea unguium: Secondary | ICD-10-CM | POA: Diagnosis not present

## 2014-06-28 DIAGNOSIS — M79673 Pain in unspecified foot: Secondary | ICD-10-CM

## 2014-06-28 NOTE — Progress Notes (Signed)
She presents today complaining of painful elongated toenails. ° °Objective: Pulses are palpable bilateral. Nails are thick yellow dystrophic onychomycotic and painful palpation. ° °Assessment: Pain in limb secondary to onychomycosis 1 through 5 bilateral. ° °Plan: Debridement of nails 1 through 5 bilateral covered service secondary to pain. °

## 2014-07-09 ENCOUNTER — Other Ambulatory Visit: Payer: Self-pay | Admitting: Internal Medicine

## 2014-07-09 ENCOUNTER — Other Ambulatory Visit (INDEPENDENT_AMBULATORY_CARE_PROVIDER_SITE_OTHER): Payer: PPO

## 2014-07-09 DIAGNOSIS — I10 Essential (primary) hypertension: Secondary | ICD-10-CM

## 2014-07-09 DIAGNOSIS — E7800 Pure hypercholesterolemia, unspecified: Secondary | ICD-10-CM

## 2014-07-09 LAB — HEPATIC FUNCTION PANEL
ALBUMIN: 3.8 g/dL (ref 3.5–5.2)
ALT: 16 U/L (ref 0–35)
AST: 18 U/L (ref 0–37)
Alkaline Phosphatase: 64 U/L (ref 39–117)
BILIRUBIN TOTAL: 0.6 mg/dL (ref 0.2–1.2)
Bilirubin, Direct: 0.1 mg/dL (ref 0.0–0.3)
Total Protein: 6.7 g/dL (ref 6.0–8.3)

## 2014-07-09 LAB — BASIC METABOLIC PANEL
BUN: 17 mg/dL (ref 6–23)
CALCIUM: 9.9 mg/dL (ref 8.4–10.5)
CO2: 30 mEq/L (ref 19–32)
CREATININE: 0.67 mg/dL (ref 0.40–1.20)
Chloride: 103 mEq/L (ref 96–112)
GFR: 88.46 mL/min (ref >60.00)
GLUCOSE: 101 mg/dL — AB (ref 70–99)
Potassium: 3.9 mEq/L (ref 3.5–5.1)
Sodium: 137 mEq/L (ref 135–145)

## 2014-07-09 LAB — LIPID PANEL
CHOLESTEROL: 179 mg/dL (ref 0–200)
HDL: 55.3 mg/dL (ref >39.00)
LDL CALC: 97 mg/dL (ref 0–99)
NonHDL: 123.7
Total CHOL/HDL Ratio: 3
Triglycerides: 135 mg/dL (ref 0.0–149.0)
VLDL: 27 mg/dL (ref 0.0–40.0)

## 2014-07-10 ENCOUNTER — Encounter: Payer: Self-pay | Admitting: Internal Medicine

## 2014-07-13 ENCOUNTER — Encounter: Payer: Self-pay | Admitting: Internal Medicine

## 2014-07-13 ENCOUNTER — Ambulatory Visit (INDEPENDENT_AMBULATORY_CARE_PROVIDER_SITE_OTHER): Payer: PPO | Admitting: Internal Medicine

## 2014-07-13 VITALS — BP 120/70 | HR 61 | Temp 97.9°F | Ht 66.0 in | Wt 151.1 lb

## 2014-07-13 DIAGNOSIS — Z91048 Other nonmedicinal substance allergy status: Secondary | ICD-10-CM

## 2014-07-13 DIAGNOSIS — Z9109 Other allergy status, other than to drugs and biological substances: Secondary | ICD-10-CM

## 2014-07-13 DIAGNOSIS — E2839 Other primary ovarian failure: Secondary | ICD-10-CM

## 2014-07-13 DIAGNOSIS — I10 Essential (primary) hypertension: Secondary | ICD-10-CM

## 2014-07-13 DIAGNOSIS — R7989 Other specified abnormal findings of blood chemistry: Secondary | ICD-10-CM

## 2014-07-13 DIAGNOSIS — M549 Dorsalgia, unspecified: Secondary | ICD-10-CM

## 2014-07-13 DIAGNOSIS — R5383 Other fatigue: Secondary | ICD-10-CM

## 2014-07-13 DIAGNOSIS — K219 Gastro-esophageal reflux disease without esophagitis: Secondary | ICD-10-CM | POA: Diagnosis not present

## 2014-07-13 DIAGNOSIS — E7800 Pure hypercholesterolemia, unspecified: Secondary | ICD-10-CM

## 2014-07-13 DIAGNOSIS — Z0000 Encounter for general adult medical examination without abnormal findings: Secondary | ICD-10-CM

## 2014-07-13 DIAGNOSIS — E871 Hypo-osmolality and hyponatremia: Secondary | ICD-10-CM

## 2014-07-13 DIAGNOSIS — R945 Abnormal results of liver function studies: Secondary | ICD-10-CM

## 2014-07-13 DIAGNOSIS — R531 Weakness: Secondary | ICD-10-CM

## 2014-07-13 MED ORDER — METOPROLOL SUCCINATE ER 25 MG PO TB24: 25.0000 mg | ORAL_TABLET | Freq: Every day | ORAL | Status: DC

## 2014-07-13 NOTE — Progress Notes (Signed)
Patient ID: Janet Moran, female   DOB: 07/23/1925, 81 y.o.   MRN: [id number]   Subjective:    Patient ID: Janet Moran, female    DOB: [birth year], 81 y.o.   MRN: [id number]  HPI  Patient here for a physical.  States she is doing relatively well.  Main complaint is that of fatigue.  Some weakness.  Desires some muscle strengthening.  [place] mall.  No cardiac symptoms with increased activity or exertion.  Breathing stable.  Some allergy symptoms, but under reasonable control.  Eating and drinking well.  No nausea or vomiting.  Bowels stable.  Was off prilosec.  Increased reflux symptoms.  Back on prilosec now.  Symptoms better.     Past Medical History  Diagnosis Date  . Arthritis   . Chicken pox   . GERD (gastroesophageal reflux disease)   . Glaucoma   . Hypertension   . Hypercholesterolemia     Current Outpatient Prescriptions on File Prior to Visit  Medication Sig Dispense Refill  . amLODipine (NORVASC) 5 MG tablet TAKE ONE (1) TABLET BY MOUTH EVERY DAY 30 tablet 6  . amoxicillin (AMOXIL) 875 MG tablet Prior to dental procedures    . aspirin EC 81 MG tablet Take 81 mg by mouth daily.    . calcium carbonate (OS-CAL) 600 MG TABS Take 600 mg by mouth 2 (two) times daily with a meal. Take 1 tablet by mouth twice daily with food    . latanoprost (XALATAN) 0.005 % ophthalmic solution Place 1 drop into both eyes at bedtime.    Marland Kitchen loratadine (CLARITIN) 10 MG tablet Take 10 mg by mouth daily.    Marland Kitchen losartan (COZAAR) 50 MG tablet TAKE ONE (1) TABLET BY MOUTH EVERY DAY 30 tablet 6  . naproxen sodium (ANAPROX) 220 MG tablet Take 220 mg by mouth 2 (two) times daily with a meal.    . omeprazole (PRILOSEC) 20 MG capsule Take 20 mg by mouth daily.    . simvastatin (ZOCOR) 20 MG tablet Take 1 tablet (20 mg total) by mouth every evening. 30 tablet 5  . timolol (BETIMOL) 0.25 % ophthalmic solution Place 1-2 drops into the right eye 2 (two) times daily.      No current  facility-administered medications on file prior to visit.    Review of Systems  Constitutional: Positive for fatigue. Negative for appetite change and unexpected weight change.  HENT: Negative for congestion and sinus pressure.   Eyes: Negative for pain and visual disturbance.  Respiratory: Negative for cough, chest tightness and shortness of breath.   Cardiovascular: Negative for chest pain, palpitations and leg swelling.  Gastrointestinal: Negative for nausea, vomiting, abdominal pain and diarrhea.  Genitourinary: Negative for dysuria and difficulty urinating.  Musculoskeletal: Negative for back pain and joint swelling.  Skin: Negative for color change and rash.  Neurological: Negative for dizziness, light-headedness and headaches.  Hematological: Negative for adenopathy. Does not bruise/bleed easily.  Psychiatric/Behavioral: Negative for dysphoric mood and agitation.       Objective:     Blood pressure recheck:  130/68, pulse 64  Physical Exam  Constitutional: She is oriented to person, place, and time. She appears well-developed and well-nourished.  HENT:  Nose: Nose normal.  Mouth/Throat: Oropharynx is clear and moist.  Eyes: Right eye exhibits no discharge. Left eye exhibits no discharge. No scleral icterus.  Neck: Neck supple. No thyromegaly present.  Cardiovascular: Normal rate and regular rhythm.   Pulmonary/Chest: Breath sounds normal. No  accessory muscle usage. No tachypnea. No respiratory distress. She has no decreased breath sounds. She has no wheezes. She has no rhonchi. Right breast exhibits no inverted nipple, no mass, no nipple discharge and no tenderness (no axillary adenopathy). Left breast exhibits no inverted nipple, no mass, no nipple discharge and no tenderness (no axilarry adenopathy).  Abdominal: Soft. Bowel sounds are normal. There is no tenderness.  Musculoskeletal: She exhibits no edema or tenderness.  Lymphadenopathy:    She has no cervical adenopathy.    Neurological: She is alert and oriented to person, place, and time.  Skin: Skin is warm. No rash noted.  Psychiatric: She has a normal mood and affect. Her behavior is normal.    BP 120/70 mmHg  Pulse 61  Temp(Src) 97.9 F (36.6 C) (Oral)  Ht 5\' 6"  (1.676 m)  Wt 151 lb 2 oz (68.55 kg)  BMI 24.40 kg/m2  SpO2 96% Wt Readings from Last 3 Encounters:  07/13/14 151 lb 2 oz (68.55 kg)  03/11/14 147 lb 12 oz (67.019 kg)  12/22/13 147 lb (66.679 kg)     Lab Results  Component Value Date   WBC 4.4 03/09/2014   HGB 13.4 03/09/2014   HCT 40.2 03/09/2014   PLT 189.0 03/09/2014   GLUCOSE 101* 07/09/2014   CHOL 179 07/09/2014   TRIG 135.0 07/09/2014   HDL 55.30 07/09/2014   LDLCALC 97 07/09/2014   ALT 16 07/09/2014   AST 18 07/09/2014   NA 137 07/09/2014   K 3.9 07/09/2014   CL 103 07/09/2014   CREATININE 0.67 07/09/2014   BUN 17 07/09/2014   CO2 30 07/09/2014   TSH 3.52 03/09/2014       Assessment & Plan:   Problem List Items Addressed This Visit    Abnormal liver function test    Recheck liver panel with next labs.  Last check wnl.       Relevant Orders   Hepatic function panel   Back pain    Stable.        Environmental allergies    Still some allergy symptoms, but overall under reasonable control.  Follow.        Fatigue    Some fatigue as outlined.  Still walking.  Eating and drinking well.  No pain.  No cardiac symptoms.  Breathing stable.  Recent cbc and tsh wnl.  Sodium just checked and wnl.  She desires no further w/up.  Follow.  She is agreeable to physical therapy for strengthening.  Order for referral made.       Relevant Orders   Ambulatory referral to Physical Therapy   GERD (gastroesophageal reflux disease)    Off prilosec for a while.  Symptoms increased.  Back on omeprazole.  Symptoms controlled now per her report.  Follow.       Health care maintenance    Physical today.  Mammogram 04/28/14 - Birads I.  Colonoscopy 02/15/11 - normal.        Hypercholesterolemia    Low cholesterol diet and exercise.  On simvastatin.  Recent lipid panel wnl.  LDL 97.  Follow.       Relevant Medications   metoprolol succinate (TOPROL-XL) 24 hr tablet   Other Relevant Orders   Lipid panel   Hypertension    Blood pressure doing well.   Same medication regimen.  Follow pressures.  Follow metabolic panel.       Relevant Medications   metoprolol succinate (TOPROL-XL) 24 hr tablet   Other Relevant Orders  Basic metabolic panel   Hyponatremia    Recent sodium wnl.  Follow.        Other Visit Diagnoses    Estrogen deficiency    -  Primary    Relevant Orders    DG Bone Density    Weakness        Relevant Orders    Ambulatory referral to Physical Therapy      I spent 25 minutes with the patient and more than 50% of the time was spent in consultation regarding the above.     Dale [place], MD

## 2014-07-13 NOTE — Progress Notes (Signed)
Pre visit review using our clinic review tool, if applicable. No additional management support is needed unless otherwise documented below in the visit note. 

## 2014-07-18 ENCOUNTER — Encounter: Payer: Self-pay | Admitting: Internal Medicine

## 2014-07-18 DIAGNOSIS — Z0000 Encounter for general adult medical examination without abnormal findings: Secondary | ICD-10-CM | POA: Insufficient documentation

## 2014-07-18 DIAGNOSIS — R5383 Other fatigue: Secondary | ICD-10-CM | POA: Insufficient documentation

## 2014-07-18 NOTE — Assessment & Plan Note (Addendum)
Some fatigue as outlined.  Still walking.  Eating and drinking well.  No pain.  No cardiac symptoms.  Breathing stable.  Recent cbc and tsh wnl.  Sodium just checked and wnl.  She desires no further w/up.  Follow.  She is agreeable to physical therapy for strengthening.  Order for referral made.

## 2014-07-18 NOTE — Assessment & Plan Note (Signed)
Stable

## 2014-07-18 NOTE — Assessment & Plan Note (Signed)
Blood pressure doing well.  Same medication regimen.  Follow pressures.  Follow metabolic panel.   

## 2014-07-18 NOTE — Assessment & Plan Note (Signed)
Recent sodium wnl.  Follow.  

## 2014-07-18 NOTE — Assessment & Plan Note (Signed)
Low cholesterol diet and exercise.  On simvastatin.  Recent lipid panel wnl.  LDL 97.  Follow.

## 2014-07-18 NOTE — Assessment & Plan Note (Signed)
Physical today.  Mammogram 04/28/14 - Birads I.  Colonoscopy 02/15/11 - normal.

## 2014-07-18 NOTE — Assessment & Plan Note (Addendum)
Off prilosec for a while.  Symptoms increased.  Back on omeprazole.  Symptoms controlled now per her report.  Follow.

## 2014-07-18 NOTE — Assessment & Plan Note (Signed)
Recheck liver panel with next labs.  Last check wnl.

## 2014-07-18 NOTE — Assessment & Plan Note (Signed)
Still some allergy symptoms, but overall under reasonable control.  Follow.

## 2014-07-31 NOTE — H&P (Signed)
PATIENT NAME:  Janet Moran, Janet Moran MR#:  [id number] DATE OF BIRTH:  [birth year]  PRIMARY CARE PHYSICIAN:  Dale Vandiver, MD  REQUESTING PHYSICIAN:  Kathreen Devoid. Paduchowski, MD  CHIEF COMPLAINT: Dizziness.   HISTORY OF PRESENT ILLNESS: The patient is an 81 year old female with a known history of hypertension, GERD, who is being admitted for severe symptomatic hyponatremia. The patient reports having low sodium in the past, although she could not recall how low she has been to.  Her daughter, who is in the room, also reports that she might have taken sodium tablets,  sometimes in the distant past, although the patient denies it. For about a week the patient has been feeling weak, might have had cold symptoms for which she went to Dr. Dale [place] who felt maybe she had some allergy symptoms and start her on Mucinex, Robitussin, and some Ocean Spray, but patient continued to get lightheadedness and more sleepy and lethargic, so she quit taking those. She was also given prescription for amoxicillin for possible infection which she started taking it, but patient continued to get worse for which she went back to see Dr. Dale [place] today.  At Dr. Roby Lofts office, her blood pressure was low with a pressure of 98/52 standing and lying down she had 128/60.  Certainly she had orthostatic symptoms. The patient also reported reduced p.o. intake for the last couple of days at Dr. Roby Lofts office and she also reports same here. She denies any obvious vertigo symptoms, but just feels really lightheaded and dizzy. In the ED, her lab workup shows severe hyponatremia with sodium of 117 and is being admitted for further evaluation and management.   PAST MEDICAL HISTORY: 1.  Hypertension.  2.  GERD.  3.  Hyperlipidemia.   ALLERGIES: No known drug allergies.   SOCIAL HISTORY: No smoking. No alcohol. No IV drugs of abuse.   FAMILY HISTORY: Positive for stroke in his father in 23s.   MEDICATIONS AT HOME: 1.  Amlodipine 5  mg p.o. daily.  2.  Aspirin 81 mg p.o. daily.  3.  Hyzaar 25/100 mg 1 tablet p.o. daily.  4.  Latanoprost 0.005% ophthalmic drops to each eye at bedtime. 5.  Metoprolol 50 mg 1/2 tablet p.o. daily.  6.  Simvastatin 20 mg p.o. at bedtime.   REVIEW OF SYSTEMS:   CONSTITUTIONAL: No fever. Positive for fatigue and weakness.  EYES: No blurred or double vision.  ENT: No tinnitus or ear pain.  RESPIRATORY: No cough, wheezing, hemoptysis.  CARDIOVASCULAR: No chest pain, orthopnea, edema.  GASTROINTESTINAL: No nausea, vomiting, or diarrhea.  GENITOURINARY: No dysuria or hematuria.  ENDOCRINE: No polyuria or nocturia.  HEMATOLOGY: No anemia or easy bruising.  SKIN: No rash or lesion.  MUSCULOSKELETAL: No arthritis or muscle cramp.  NEUROLOGIC: No tingling, numbness, positive for dizziness and lightheadedness.  PSYCHIATRIC: No history of anxiety or depression.   PHYSICAL EXAMINATION: VITAL SIGNS: Temperature 98.1, heart rate 57 per minute, respirations 18 per minute, blood pressure 139/62 mmHg. She is saturating 98% on room air.  GENERAL: The patient is an 81 year old female lying in the bed comfortably without any acute distress.  HEENT: Head atraumatic, normocephalic. Oropharynx and nasopharynx dry and clear.  Eyes, pupils equal, round, reactive to light and accommodation. No scleral icterus, extraocular muscles intact.  NECK: Supple. No jugular venous distention. No thyroid enlargement or tenderness.  LUNGS: Clear to auscultation bilaterally. No wheezing, rales, rhonchi, or crepitation.  CARDIOVASCULAR: S1, S2 normal. No murmurs, rubs, or gallop.  ABDOMEN:  Soft, nontender, nondistended. Bowel sounds present. No organomegaly or masses.  EXTREMITIES: No pedal edema, cyanosis, or clubbing.  NEUROLOGIC: Cranial nerves III-II through XII intact. Muscle strength 5/5 in all extremities. Sensation intact.  PSYCHIATRIC: The patient is alert and oriented x 3.   SKIN: No obvious rash, lesion or  ulcer.  MUSCULOSKELETAL: No joint effusion or tenderness.   LABORATORY DATA: Normal CBC. Normal TSH. Normal first set of troponins. Normal liver function tests except AST of 40. Normal BMP except sodium 117, chloride of 81, creatinine 0.47, blood glucose of 104. UA was negative.   Chest x-ray in the ED showed no acute cardiopulmonary disease, possible acute bronchitis.   IMPRESSION AND PLAN: 1.  Severe symptomatic hyponatremia, possible acute on chronic, although the patient does not have any old laboratories to compare with. We will start on gentle intravenous hydration. Check MRI of the brain for possible stroke. If she does not improve, consider nephrology consultation. She certainly could have syndrome of inappropriate antidiuretic hormone (secretion) from some neurological insult. Will check a urine osmolality and sodium along with cortisol.  Her TSH is already normal. She does report some poor p.o. intake. 2.  Dizziness. Certainly from severe hyponatremia. Cannot rule out stroke at this time. Will get physical therapy evaluation and management.  3.  Hypertension. We will continue her home medication except diuretic considering her low sodium.  4.  Gastroesophageal reflux disease. We will continue proton pump inhibitor.  CODE STATUS: Full code.   Total time taking care of this patient was 55 minutes.   ____________________________ Ellamae SiaVipul S. Sherryll BurgerShah, MD vss:LT D: 12/15/2013 19:25:47 ET T: 12/15/2013 20:22:11 ET JOB#: [id number]  cc: Thane Age S. Sherryll BurgerShah, MD, <Dictator> Dale Durhamharlene Scott, MD Ellamae SiaVIPUL S [facility] MD ELECTRONICALLY SIGNED 12/21/2013 11:48

## 2014-07-31 NOTE — Discharge Summary (Signed)
PATIENT NAME:  Janet HazyMICHAEL, Pier J MR#:  [id number] DATE OF BIRTH:  01/16/1926  DATE OF ADMISSION:  12/15/2013 DATE OF DISCHARGE:  12/17/2013  PRESENTING COMPLAINT: Generalized weakness.   DISCHARGE DIAGNOSES: 1.  Acute hyponatremia, improved, resolved.  2.  Hypertension.   CODE STATUS: FULL.  DISCHARGE MEDICATIONS: 1.  Amlodipine 5 mg daily.  2.  Toprol-XL 25 mg p.o. daily.  3.  Latanoprost ophthalmic drops 0.005 one drop to effected eye daily at bedtime.  4.  Simvastatin 20 mg at bedtime.  5.  Aspirin 81 mg daily.  6.  KCl 20 mEq p.o. at bedtime.  7.  Losartan 50 mg daily.   DISCHARGE DIET: Regular.   DISCHARGE INSTRUCTIONS: Follow up with Dr. Dale Durhamharlene Scott in 1 to 2 weeks.   DIAGNOSTIC DATA: Sodium at discharge was 132, potassium was 3.5. CBC within normal limits.   MRI of the brain showed no acute abnormality.  BRIEF SUMMARY OF HOSPITAL COURSE: [name] is a pleasant 81 year old Caucasian female with history of hypertension who comes to the Emergency Room after she started having increasing weakness. She was admitted with:  1.  Severe symptomatic hyponatremia, possible acute. Her sodium was 117. She was on Hyzaar, which was discontinued and started on IV fluids. TSH was normal. Her sodium came up to 132 prior to discharge.  2.  Hypokalemia. Repleted.  3.  Dizziness. It was likely from hyponatremia and dehydration, resolved. Physical therapy recommends no PT needs at home.  4.  Hypertension. Continued her amlodipine and losartan. Hydrochlorothiazide was discontinued.   Hospital stay otherwise remained stable. The patient remained a FULL code.   TIME SPENT: 40 minutes.  ____________________________ Wylie HailSona A. Allena KatzPatel, MD sap:sb D: 12/18/2013 13:22:03 ET T: 12/18/2013 13:48:04 ET JOB#: [id number]  cc: Shamecca Whitebread A. Allena KatzPatel, MD, <Dictator> Dale Durhamharlene Scott, MD Willow OraSONA A Mirian Casco MD ELECTRONICALLY SIGNED 12/28/2013 10:45

## 2014-08-17 ENCOUNTER — Encounter: Payer: Self-pay | Admitting: Physical Therapy

## 2014-08-17 ENCOUNTER — Ambulatory Visit: Payer: PPO | Attending: Internal Medicine | Admitting: Physical Therapy

## 2014-08-17 DIAGNOSIS — M6281 Muscle weakness (generalized): Secondary | ICD-10-CM | POA: Insufficient documentation

## 2014-08-17 DIAGNOSIS — M25552 Pain in left hip: Secondary | ICD-10-CM | POA: Insufficient documentation

## 2014-08-17 DIAGNOSIS — R262 Difficulty in walking, not elsewhere classified: Secondary | ICD-10-CM

## 2014-08-17 NOTE — Therapy (Signed)
[place] [facility] [address] [place], [place], [address] Phone: [phone]   Fax:  [phone]  Physical Therapy Evaluation  Patient Details  Name: Janet Moran MRN: [id number] Date of Birth: 1925/08/15 Referring Provider:  Dale DurhamScott, Charlene, MD  Encounter Date: 08/17/2014      PT End of Session - 08/17/14 1512    Visit Number 1   Number of Visits 8   Date for PT Re-Evaluation 09/14/14   Authorization Type PT certification   PT Start Time 1408   PT Stop Time 1457   PT Time Calculation (min) 49 min   Activity Tolerance Patient tolerated treatment well   Behavior During Therapy [facility] for tasks assessed/performed      Past Medical History  Diagnosis Date  . Arthritis   . Chicken pox   . GERD (gastroesophageal reflux disease)   . Glaucoma   . Hypertension   . Hypercholesterolemia     Past Surgical History  Procedure Laterality Date  . Tonsillectomy and adenoidectomy  1935  . Abdominal hysterectomy  1976    There were no vitals filed for this visit.  Visit Diagnosis:  Generalized muscle weakness - Plan: PT plan of care cert/re-cert  Difficulty walking - Plan: PT plan of care cert/re-cert  Hip pain, acute, left - Plan: PT plan of care cert/re-cert      Subjective Assessment - 08/17/14 1501    Subjective Pt is an 81 y.o. female who reports that she has gotten weak in her LE's, causing her to have trouble getting up from a stooped/squatting position with gardening, and difficulty getting out of a chair without pushing up with her hands.  She denies any falls over the past year.  She [place] about 3x[place] for exercise for about 20-25 mins.  She reports occasional L lateral hip pain in the area of the greater trochanter that often comes after prolonged ambulation.   Limitations Walking;Other (comment)  trouble rising from chair   Patient Stated Goals be able to rise from ground when gardening with  less difficulty; be able to get out of a chair with less difficulty   Currently in Pain? No/denies   Pain Location Hip  occasional L lateral hip pain; not currently   Multiple Pain Sites No            [facility] PT Assessment - 08/17/14 0001    Assessment   Medical Diagnosis General LE weakness; L hip pain; difficulty walking   Onset Date 07/09/14   Balance Screen   Has the patient fallen in the past 6 months No   Home Environment   Living Enviornment Private residence   Living Arrangements Spouse/significant other   Home Layout Two level   Alternate Level Stairs-Number of Steps 15 stairs inside with one rail   Alternate Level Stairs-Rails Right   Cognition   Overall Cognitive Status Within Functional Limits for tasks assessed   Observation/Other Assessments   Lower Extremity Functional Scale  scored 40 out of 80   Posture/Postural Control   Posture Comments stands and ambulates with forward head and rounded shoulder posturing   ROM / Strength   AROM / PROM / Strength AROM  WFL grossly throughout trunk and B UE's and LE's   Strength   Overall Strength Deficits   Overall Strength Comments Decreased strength to 4/5 grossly throughout B hips, knees, and ankles   Palpation   Palpation Denies tenderness to palpation at L  lateral hip upon assessment   Transfers   Transfers Sit to Stand   Sit to Stand 6: Modified independent (Device/Increase time);With upper extremity assist  Needs to use UE's and takes increased time   Ambulation/Gait   Ambulation/Gait Yes   Ambulation/Gait Assistance 7: Independent  WFL   Gait Comments Slightly decreased cadence and decreased endurance   6 Minute Walk- Baseline   6 Minute Walk- Baseline yes  1,090 feet with no assistive device                           PT Education - 08/17/14 1511    Education provided Yes   Education Details initiated new HEP; see ther ex's in patient instructions section   Person(s) Educated Patient    Methods Explanation;Demonstration;Handout   Comprehension Verbalized understanding             PT Long Term Goals - 08/17/14 1518    PT LONG TERM GOAL #1   Title Pt will be educated in and I with a home exercise program to promote overall LE and core strength to allow her greater ease with sit to stand transfers and walking.   Time 1   Period Weeks   Status New   PT LONG TERM GOAL #2   Title Pt will report less frequent or no L lateral hip pain when walking for recreation/exercise.   Time 4   Period Weeks   Status New   PT LONG TERM GOAL #3   Title Pt will report being able to get out of chairs, and from a stooped position when gardening, with less difficulty and less use of UE's.   Time 4   Period Weeks   Status New   PT LONG TERM GOAL #4   Title Pt will have an improved 6 minute walk score to at least 1275 feet or better, indicating improved strength and endurance.   Time 4   Period Weeks   Status New   PT LONG TERM GOAL #5   Title Pt will have an improved Lower Extremity Functional Scale score by at least 25-30%, indicating less functional disability due to LE weakness.   Time 4   Period Weeks   Status New               Plan - 08/17/14 1513    Clinical Impression Statement Janet Moran should respond well to skilled PT intervention to improve overall LE and core strength and endurance, thereby allowing her to perform functional tasks and walking with less difficulty.  She currently has decreased overall core and LE strength B  (4/5 with manual muscle testing).  She has trouble rising from a seated position, and needs to use her UE's to get out of a chair.   Pt will benefit from skilled therapeutic intervention in order to improve on the following deficits Decreased endurance;Decreased strength;Difficulty walking;Pain   Rehab Potential Good   Clinical Impairments Affecting Rehab Potential advanced age   PT Frequency 2x / week   PT Duration 4 weeks   PT  Treatment/Interventions Cryotherapy;Moist Heat;Gait training;Therapeutic exercise;Therapeutic activities;Manual techniques;Patient/family education   PT Next Visit Plan review HEP; advance strengthening ther ex's as tolerated; modalities for L hip pain as needed   PT Home Exercise Plan initiated today   Consulted and Agree with Plan of Care Patient         Problem List Patient Active Problem List   Diagnosis Date Noted  .  Fatigue 07/18/2014  . Health care maintenance 07/18/2014  . Environmental allergies 03/14/2014  . Cough 12/27/2013  . Abnormal liver function test 12/27/2013  . Light headedness 12/17/2013  . Sinusitis 12/13/2013  . Tongue lesion 08/02/2013  . Back pain 04/09/2013  . Hypertension 01/14/2012  . Hypercholesterolemia 01/14/2012  . Hyponatremia 01/14/2012  . GERD (gastroesophageal reflux disease) 01/14/2012    Alizon Schmeling, MPT 08/17/2014, 3:30 PM  [facility] [address], [place], [address] Phone: [phone]   Fax:  [phone]

## 2014-08-17 NOTE — Patient Instructions (Signed)
Heel Raise (Calf Strength / Balance)   Rise up on tiptoes.  No ankle weights at this time.  Hold position to count of _5__.  Repeat _2x10__ times per session. Do_2__ sessions per day. Variation: Do without weights.  Copyright  VHI. All rights reserved.  Hamstring Curl: Standing (Single Leg)   Do not use tubing as pictured. Just stand at kitchen counter for balance and curl your lower leg upward toward your buttock, bending your knee. Repeat _2x10 on each leg_ times per set. Repeat with other leg. Twice a day.  http://tub.exer.us/198   Copyright  VHI. All rights reserved.  Bridge   Lie back, legs bent. Inhale, pressing hips up. Keeping ribs in, lengthen lower back. Exhale, rolling down along spine from top. Repeat __2x10__ times. Do _2-3___ sessions per day.  Copyright  VHI. All rights reserved.  Balance, Proprioception: Hip Flexion    Do not use tubing as pictured.  Swing your leg forward keeping your knee straight.  Hold out in front of you for 3-5 seconds. Return. Repeat _2x10 on each leg. Do _2___ sessions per day.  http://cc.exer.us/18   Copyright  VHI. All rights reserved.  EXTENSION: Standing (Active)   Stand, both feet flat. Draw right leg behind body as far as possible. Don't use ankle weights at this time. Complete __2_ sets of _10__ repetitions on each leg. Perform _2__ sessions per day.  http://gtsc.exer.us/77   Copyright  VHI. All rights reserved.  ADDUCTION: Standing - Stable (Active)   Stand, right leg out to side as far as possible. Draw leg in across midline. Do not use ankle weight at this time. Complete _2__ sets of _10__ repetitions on each leg. Perform _2__ sessions per day.  http://gtsc.exer.us/145   Copyright  VHI. All rights reserved.  ABDUCTION: Standing (Active)   Stand, feet flat. Lift right leg out to side. Don't use ankle weight at this time. Complete _2_ sets of _10_ repetitions on each leg. Perform _2__ sessions per  day.   ICE your outer left hip for 10 minutes after exercises, before bed, and after walking for exercise.  http://gtsc.exer.us/111   Copyright  VHI. All rights reserved.

## 2014-08-23 ENCOUNTER — Ambulatory Visit: Payer: PPO | Admitting: Physical Therapy

## 2014-08-23 DIAGNOSIS — M6281 Muscle weakness (generalized): Secondary | ICD-10-CM | POA: Diagnosis not present

## 2014-08-23 DIAGNOSIS — R262 Difficulty in walking, not elsewhere classified: Secondary | ICD-10-CM

## 2014-08-23 NOTE — Therapy (Signed)
[place] [facility] [address] [place], [place], [address] Phone: [phone]   Fax:  [phone]  Physical Therapy Treatment  Patient Details  Name: Janet Moran J Joslin MRN: [id number] Date of Birth: [birth year] Referring Provider:  Dale DurhamScott, Charlene, MD  Encounter Date: 08/23/2014      PT End of Session - 08/23/14 1125    Visit Number 2   Number of Visits 8   Date for PT Re-Evaluation 09/14/14   PT Start Time 1028   PT Stop Time 1115   PT Time Calculation (min) 47 min   Activity Tolerance Patient limited by fatigue  Pt needed frequent rest breaks; DOE with ther ex's   Behavior During Therapy [facility] for tasks assessed/performed      Past Medical History  Diagnosis Date  . Arthritis   . Chicken pox   . GERD (gastroesophageal reflux disease)   . Glaucoma   . Hypertension   . Hypercholesterolemia     Past Surgical History  Procedure Laterality Date  . Tonsillectomy and adenoidectomy  1935  . Abdominal hysterectomy  1976    There were no vitals filed for this visit.  Visit Diagnosis:  Muscle weakness (generalized)  Difficulty walking      Subjective Assessment - 08/23/14 1118    Subjective "I had family in town over the weekend for a funeral, so I didn't get to my new exercises too much."   Currently in Pain? No/denies                         [facility] Treatment/Exercise - 08/23/14 0001    Transfers   Transfers Sit to Stand  2x3 with mod use of hands from green gym chair   Sit to Stand 5: Supervision  Pt had difficulty from chair; did 2x10 from plinth 25.5"high   Knee/Hip Exercises: Aerobic   Stationary Bike Nustep L2x 5 min No UE's   Knee/Hip Exercises: Standing   Heel Raises 2 sets;10 reps  Heel/Toe raises with 1# ankle wts   Hip ADduction 2 sets;10 reps  B with 1# ankle wts with TA squeeze   Functional Squat 2 sets;10 reps  Mini squats with TA squeeze   Other Standing Knee  Exercises B stdg hip extension and flexion SLRs  2x10 B with TA squeeze   Knee/Hip Exercises: Seated   Other Seated Knee Exercises seated hip add ball squeezes w/ simultaneous glut squeezes 2x10                     PT Long Term Goals - 08/17/14 1518    PT LONG TERM GOAL #1   Title Pt will be educated in and I with a home exercise program to promote overall LE and core strength to allow her greater ease with sit to stand transfers and walking.   Time 1   Period Weeks   Status New   PT LONG TERM GOAL #2   Title Pt will report less frequent or no L lateral hip pain when walking for recreation/exercise.   Time 4   Period Weeks   Status New   PT LONG TERM GOAL #3   Title Pt will report being able to get out of chairs, and from a stooped position when gardening, with less difficulty and less use of UE's.   Time 4   Period Weeks   Status New   PT LONG TERM GOAL #4  Title Pt will have an improved 6 minute walk score to at least 1275 feet or better, indicating improved strength and endurance.   Time 4   Period Weeks   Status New   PT LONG TERM GOAL #5   Title Pt will have an improved Lower Extremity Functional Scale score by at least 25-30%, indicating less functional disability due to LE weakness.   Time 4   Period Weeks   Status New               Plan - 08/23/14 1126    Clinical Impression Statement Pt responded well to treatment, but had dyspnea on exertion and needed frequent rest breaks. Needs further LE and core strengthening, and endurance training.   Pt will benefit from skilled therapeutic intervention in order to improve on the following deficits Decreased strength;Decreased endurance   Rehab Potential Good        Problem List Patient Active Problem List   Diagnosis Date Noted  . Fatigue 07/18/2014  . Health care maintenance 07/18/2014  . Environmental allergies 03/14/2014  . Cough 12/27/2013  . Abnormal liver function test 12/27/2013  .  Light headedness 12/17/2013  . Sinusitis 12/13/2013  . Tongue lesion 08/02/2013  . Back pain 04/09/2013  . Hypertension 01/14/2012  . Hypercholesterolemia 01/14/2012  . Hyponatremia 01/14/2012  . GERD (gastroesophageal reflux disease) 01/14/2012    Jep Dyas, MPT 08/23/2014, 11:30 AM  [place] [facility] [address] [place], [place], [address] Phone: [phone]   Fax:  [phone]

## 2014-08-26 ENCOUNTER — Ambulatory Visit: Payer: PPO | Attending: Internal Medicine | Admitting: Physical Therapy

## 2014-08-26 ENCOUNTER — Encounter: Payer: PPO | Admitting: Physical Therapy

## 2014-08-26 DIAGNOSIS — R262 Difficulty in walking, not elsewhere classified: Secondary | ICD-10-CM | POA: Insufficient documentation

## 2014-08-26 DIAGNOSIS — M25552 Pain in left hip: Secondary | ICD-10-CM | POA: Insufficient documentation

## 2014-08-26 DIAGNOSIS — M6281 Muscle weakness (generalized): Secondary | ICD-10-CM

## 2014-08-26 NOTE — Therapy (Signed)
[place] [facility] [address] [place], [place], [address] Phone: [phone]   Fax:  [phone]  Physical Therapy Treatment  Patient Details  Name: Janet Moran MRN: [id number] Date of Birth: 1925-09-18 Referring Provider:  Dale DurhamScott, Charlene, MD  Encounter Date: 08/26/2014      PT End of Session - 08/26/14 1353    Visit Number 3   Number of Visits 8   Date for PT Re-Evaluation 09/14/14   Authorization Type PT certification   PT Start Time 1345   PT Stop Time 1425   PT Time Calculation (min) 40 min   Activity Tolerance Patient limited by fatigue   Behavior During Therapy [facility] for tasks assessed/performed      Past Medical History  Diagnosis Date  . Arthritis   . Chicken pox   . GERD (gastroesophageal reflux disease)   . Glaucoma   . Hypertension   . Hypercholesterolemia     Past Surgical History  Procedure Laterality Date  . Tonsillectomy and adenoidectomy  1935  . Abdominal hysterectomy  1976    There were no vitals filed for this visit.  Visit Diagnosis:  Muscle weakness (generalized)  Difficulty walking      Subjective Assessment - 08/26/14 1345    Subjective Pt reports she has had back and hip pain over the past few days. Currently she is having no pain.   Limitations Walking;Other (comment)   Patient Stated Goals be able to rise from ground when gardening with less difficulty; be able to get out of a chair with less difficulty                      Objective: Ball squeeze 3x10 YTB hip abduction 3x10 Hip extensoin in standing 3x10. Nu-step x7 min (no charge) Sit<>stand in green chair, focusing on use of UE for standing, no UE for sitting. Pt required frequent rest breaks. Manual tapping for feeling the correct musculature engage with exercises. Difficulty with hip extension - pt substitutes with lumbar spine, improves with cuing for glute activation. Amb in clinic  5x20'.           PT Education - 08/26/14 1348    Education provided Yes   Education Details Educated on yellow band hip abduction, safety with standing exercises.   Person(s) Educated Patient   Methods Explanation;Demonstration   Comprehension Verbalized understanding             PT Long Term Goals - 08/17/14 1518    PT LONG TERM GOAL #1   Title Pt will be educated in and I with a home exercise program to promote overall LE and core strength to allow her greater ease with sit to stand transfers and walking.   Time 1   Period Weeks   Status New   PT LONG TERM GOAL #2   Title Pt will report less frequent or no L lateral hip pain when walking for recreation/exercise.   Time 4   Period Weeks   Status New   PT LONG TERM GOAL #3   Title Pt will report being able to get out of chairs, and from a stooped position when gardening, with less difficulty and less use of UE's.   Time 4   Period Weeks   Status New   PT LONG TERM GOAL #4   Title Pt will have an improved 6 minute walk score to at least 1275 feet or better, indicating improved strength  and endurance.   Time 4   Period Weeks   Status New   PT LONG TERM GOAL #5   Title Pt will have an improved Lower Extremity Functional Scale score by at least 25-30%, indicating less functional disability due to LE weakness.   Time 4   Period Weeks   Status New               Plan - 08/26/14 1354    Clinical Impression Statement Moderate tolerance for exercise, pt required cuing throughout and rest breaks, demonstrates poor LE strength and endurance.   Pt will benefit from skilled therapeutic intervention in order to improve on the following deficits Decreased strength;Decreased endurance   Rehab Potential Good   Clinical Impairments Affecting Rehab Potential advanced age   PT Treatment/Interventions Cryotherapy;Moist Heat;Gait training;Therapeutic exercise;Therapeutic activities;Manual techniques;Patient/family education    PT Next Visit Plan review HEP; advance strengthening ther ex's as tolerated; modalities for L hip pain as needed   Consulted and Agree with Plan of Care Patient        Problem List Patient Active Problem List   Diagnosis Date Noted  . Fatigue 07/18/2014  . Health care maintenance 07/18/2014  . Environmental allergies 03/14/2014  . Cough 12/27/2013  . Abnormal liver function test 12/27/2013  . Light headedness 12/17/2013  . Sinusitis 12/13/2013  . Tongue lesion 08/02/2013  . Back pain 04/09/2013  . Hypertension 01/14/2012  . Hypercholesterolemia 01/14/2012  . Hyponatremia 01/14/2012  . GERD (gastroesophageal reflux disease) 01/14/2012    Fisher,Benjamin PT 08/26/2014, 2:09 PM  [facility] [address] [place], [place], [address] Phone: [phone]   Fax:  [phone]

## 2014-08-30 ENCOUNTER — Ambulatory Visit: Payer: PPO | Admitting: Physical Therapy

## 2014-08-30 DIAGNOSIS — M6281 Muscle weakness (generalized): Secondary | ICD-10-CM

## 2014-08-30 NOTE — Therapy (Signed)
[facility] [address] [place], [place], [address] Phone: [phone]   Fax:  [phone]  Physical Therapy Treatment  Patient Details  Name: Caren Hazyancy J Laverdure MRN: [id number] Date of Birth: 1925/08/23 Referring Provider:  Dale DurhamScott, Charlene, MD  Encounter Date: 08/30/2014      PT End of Session - 08/30/14 1359    Visit Number 3   Number of Visits 8   Date for PT Re-Evaluation 09/14/14   PT Start Time 1300   PT Stop Time 1346   PT Time Calculation (min) 46 min   Activity Tolerance Patient tolerated treatment well;Patient limited by fatigue  Pt has DOE and needs frequent rest breaks between ther ex's   Behavior During Therapy [facility] for tasks assessed/performed      Past Medical History  Diagnosis Date  . Arthritis   . Chicken pox   . GERD (gastroesophageal reflux disease)   . Glaucoma   . Hypertension   . Hypercholesterolemia     Past Surgical History  Procedure Laterality Date  . Tonsillectomy and adenoidectomy  1935  . Abdominal hysterectomy  1976    There were no vitals filed for this visit.  Visit Diagnosis:  Muscle weakness      Subjective Assessment - 08/30/14 1352    Subjective Pt reports she has some mild soreness in her lumbar region today.  She states that she only gets to her HEP about 1x per day.   Currently in Pain? Yes   Pain Score 3    Pain Location Back   Pain Orientation Lower   Pain Descriptors / Indicators Aching                         [facility] Adult PT Treatment/Exercise - 08/30/14 0001    Transfers   Transfers Sit to Stand   Sit to Stand 5: Supervision  2 sets of 5 reps from green chair; focus on eccentric ctrl   Knee/Hip Exercises: Aerobic   Stationary Bike Nustep L2x 7 min No UE's   Knee/Hip Exercises: Standing   Heel Raises 2 sets;10 reps   Functional Squat 2 sets;10 reps  mini squats   Other Standing Knee Exercises B stdg hip extension at bar 2x10   Other Standing Knee Exercises stdg alt marching 2x10   Knee/Hip Exercises: Seated   Other Seated Knee Exercises seated hip add ball squeezes w/ simultaneous glut squeezes 2x10   Other Seated Knee Exercises seated HS curls with GTB 2x10 B; seated B hip abd with GTB around thighs 2x10                PT Education - 08/30/14 1358    Education provided Yes   Education Details educated pt in hip rotator stretches for home    Person(s) Educated Patient   Methods Explanation;Demonstration;Verbal cues;Handout   Comprehension Returned demonstration             PT Long Term Goals - 08/17/14 1518    PT LONG TERM GOAL #1   Title Pt will be educated in and I with a home exercise program to promote overall LE and core strength to allow her greater ease with sit to stand transfers and walking.   Time 1   Period Weeks   Status New   PT LONG TERM GOAL #2   Title Pt will report less frequent or no L lateral hip pain when walking for recreation/exercise.  Time 4   Period Weeks   Status New   PT LONG TERM GOAL #3   Title Pt will report being able to get out of chairs, and from a stooped position when gardening, with less difficulty and less use of UE's.   Time 4   Period Weeks   Status New   PT LONG TERM GOAL #4   Title Pt will have an improved 6 minute walk score to at least 1275 feet or better, indicating improved strength and endurance.   Time 4   Period Weeks   Status New   PT LONG TERM GOAL #5   Title Pt will have an improved Lower Extremity Functional Scale score by at least 25-30%, indicating less functional disability due to LE weakness.   Time 4   Period Weeks   Status New               Plan - 08/30/14 1401    Clinical Impression Statement Mrs. Baillargeon is making gains with strength training.  She is now able to perform sit<to>stands from green gym chair with less difficulty and much better eccentric control when returning to sitting from standing.  This improved  with practice and verbal cueing today.        Problem List Patient Active Problem List   Diagnosis Date Noted  . Fatigue 07/18/2014  . Health care maintenance 07/18/2014  . Environmental allergies 03/14/2014  . Cough 12/27/2013  . Abnormal liver function test 12/27/2013  . Light headedness 12/17/2013  . Sinusitis 12/13/2013  . Tongue lesion 08/02/2013  . Back pain 04/09/2013  . Hypertension 01/14/2012  . Hypercholesterolemia 01/14/2012  . Hyponatremia 01/14/2012  . GERD (gastroesophageal reflux disease) 01/14/2012    Suzzette Gasparro, MPT 08/30/2014, 2:07 PM  [facility] [address], [place], [address] Phone: [phone]   Fax:  [phone]

## 2014-08-30 NOTE — Patient Instructions (Signed)
Hip Rotator / Adductor: Star Stretch   Don't do this on the floor.  Sit in a chair and cross left leg up on right thigh.  Then bend your torso forward and hold the stretch for 20 seconds. Repeat 4 times.   Copyright  VHI. All rights reserved.  Extensors / Rotators, Supine   Lie supine, one leg straight, other leg bent, knee held by opposite hand. Gently pull knee toward opposite shoulder. Feel stretch in buttocks and outside of hip. Hold _20__ seconds. Repeat _4__ times per session. Do _1-2_ sessions per day.  Copyright  VHI. All rights reserved.

## 2014-09-02 ENCOUNTER — Ambulatory Visit: Payer: PPO | Admitting: Physical Therapy

## 2014-09-02 DIAGNOSIS — M6281 Muscle weakness (generalized): Secondary | ICD-10-CM

## 2014-09-02 NOTE — Therapy (Signed)
[facility] [address] [place], [place], [address] Phone: [phone]   Fax:  [phone]  Physical Therapy Treatment  Patient Details  Name: Janet Moran MRN: [id number] Date of Birth: 12-Mar-1926 Referring Provider:  Dale DurhamScott, Charlene, MD  Encounter Date: 09/02/2014      PT End of Session - 09/02/14 1125    Visit Number 4   Number of Visits 8   Date for PT Re-Evaluation 09/14/14   PT Start Time 1030   PT Stop Time 1110   PT Time Calculation (min) 40 min   Activity Tolerance Patient tolerated treatment well;Patient limited by fatigue   Behavior During Therapy [facility] for tasks assessed/performed      Past Medical History  Diagnosis Date  . Arthritis   . Chicken pox   . GERD (gastroesophageal reflux disease)   . Glaucoma   . Hypertension   . Hypercholesterolemia     Past Surgical History  Procedure Laterality Date  . Tonsillectomy and adenoidectomy  1935  . Abdominal hysterectomy  1976    There were no vitals filed for this visit.  Visit Diagnosis:  Muscle weakness      Subjective Assessment - 09/02/14 1124    Subjective Pt reports incr. fatigue, her husband has had a respiratory infection and she may have this. Noted redness in R eye, pt reports this is due to glaucoma.   Currently in Pain? No/denies                Objective: Nu-step x7 min L2 no UE. Seated practice for feeling wt shifting into feet, 5 min practice to facilitate improved LE activation/decr. UE reliance with standing. Sit<>stand 3x8 with cuing to use UE to put wt into LE Heel raise 3x15 Same with knees bent. amb in clinic 5x20' with cuing for longer steps/improved balance.  Pt required cuing for exercises, proper rest breaks to facilitate performance.                      PT Long Term Goals - 08/17/14 1518    PT LONG TERM GOAL #1   Title Pt will be educated in and I with a home exercise program  to promote overall LE and core strength to allow her greater ease with sit to stand transfers and walking.   Time 1   Period Weeks   Status New   PT LONG TERM GOAL #2   Title Pt will report less frequent or no L lateral hip pain when walking for recreation/exercise.   Time 4   Period Weeks   Status New   PT LONG TERM GOAL #3   Title Pt will report being able to get out of chairs, and from a stooped position when gardening, with less difficulty and less use of UE's.   Time 4   Period Weeks   Status New   PT LONG TERM GOAL #4   Title Pt will have an improved 6 minute walk score to at least 1275 feet or better, indicating improved strength and endurance.   Time 4   Period Weeks   Status New   PT LONG TERM GOAL #5   Title Pt will have an improved Lower Extremity Functional Scale score by at least 25-30%, indicating less functional disability due to LE weakness.   Time 4   Period Weeks   Status New  Plan - 09/02/14 1125    Clinical Impression Statement Pt improved ability to sit down with no UE and slower/safer speed. Fatigued throughout session requiring frequent rest breaks.    Pt will benefit from skilled therapeutic intervention in order to improve on the following deficits Decreased strength;Decreased endurance   Rehab Potential Good   Clinical Impairments Affecting Rehab Potential advanced age        Problem List Patient Active Problem List   Diagnosis Date Noted  . Fatigue 07/18/2014  . Health care maintenance 07/18/2014  . Environmental allergies 03/14/2014  . Cough 12/27/2013  . Abnormal liver function test 12/27/2013  . Light headedness 12/17/2013  . Sinusitis 12/13/2013  . Tongue lesion 08/02/2013  . Back pain 04/09/2013  . Hypertension 01/14/2012  . Hypercholesterolemia 01/14/2012  . Hyponatremia 01/14/2012  . GERD (gastroesophageal reflux disease) 01/14/2012    Fisher,Benjamin 09/02/2014, 11:27 AM  [facility] [address] [place], [address] Phone: [phone]   Fax:  [phone]

## 2014-09-09 ENCOUNTER — Encounter: Payer: Self-pay | Admitting: Physical Therapy

## 2014-09-09 ENCOUNTER — Ambulatory Visit: Payer: PPO | Attending: Internal Medicine | Admitting: Physical Therapy

## 2014-09-09 DIAGNOSIS — M6281 Muscle weakness (generalized): Secondary | ICD-10-CM | POA: Diagnosis present

## 2014-09-10 NOTE — Therapy (Signed)
[facility] [address], [place], [address] Phone: [phone]   Fax:  [phone]  Physical Therapy Treatment  Patient Details  Name: Janet Moran MRN: [id number] Date of Birth: May 13, 1925 Referring Provider:  Dale Adjuntas, MD  Encounter Date: 09/09/2014      PT End of Session - 09/10/14 1106    Visit Number 5   Number of Visits 8   Date for PT Re-Evaluation 09/14/14   Authorization Type 6   Authorization Time Period 10   PT Start Time 0957   PT Stop Time 1045   PT Time Calculation (min) 48 min   Activity Tolerance Patient tolerated treatment well;Patient limited by fatigue   Behavior During Therapy [facility] for tasks assessed/performed      Past Medical History  Diagnosis Date  . Arthritis   . Chicken pox   . GERD (gastroesophageal reflux disease)   . Glaucoma   . Hypertension   . Hypercholesterolemia     Past Surgical History  Procedure Laterality Date  . Tonsillectomy and adenoidectomy  1935  . Abdominal hysterectomy  1976    There were no vitals filed for this visit.  Visit Diagnosis:  Muscle weakness (generalized)      Subjective Assessment - 09/09/14 0959    Subjective Patient reports she continues with feeling tired like she doesn't have a lot of energy and she is here for weakness in her legs and has some left hip pain after exercising.    Limitations Walking   Patient Stated Goals be able to rise from ground when gardening with less difficulty; be able to get out of a chair with less difficulty   Currently in Pain? No/denies     Objective: Treatment: 8.6 seconds (within normal limits for age and community ambulation) Sitting exercises: instructed in exercises for strengthening LE's and core: hip adduction with ball and push heels into floor x 10 reps with 3 second holds, Hip abduction with resistive band for hip stabilization x 15 reps, roll ball under foot x 2 min. Each  with controlled motion and 3# weight on ankle, LAQ x 15 reps each LE, knee flexion with resistive band x 15 reps each LE (very weak with this), walk forward and backwards at counter with verbal cues for increasing base of support x 1 min., standing hip abduction and extension with toe tap each 2 sets of 5 reps with controlled motion. Sit to stand with ball between knees on raised treatment table (22-23 inches high) x 10 reps NuStep at end of session: level #4 x 5-10 min. (unbilled) alternated UE's and LE's every minute  Patient response to treatment: required verbal cues and demonstration to perform exercises with good technique and improved with repetition, imrproved endurance as compared to previous session         PT Education - 09/09/14 1105    Education provided Yes   Education Details educated patient in additional strengthening exercises for home for [employer]) Educated Patient   Methods Explanation;Demonstration;Verbal cues   Comprehension Verbalized understanding;Returned demonstration;Verbal cues required             PT Long Term Goals - 08/17/14 1518    PT LONG TERM GOAL #1   Title Pt will be educated in and I with a home exercise program to promote overall LE and core strength to allow her greater ease with sit to stand transfers and walking.   Time 1  Period Weeks   Status New   PT LONG TERM GOAL #2   Title Pt will report less frequent or no L lateral hip pain when walking for recreation/exercise.   Time 4   Period Weeks   Status New   PT LONG TERM GOAL #3   Title Pt will report being able to get out of chairs, and from a stooped position when gardening, with less difficulty and less use of UE's.   Time 4   Period Weeks   Status New   PT LONG TERM GOAL #4   Title Pt will have an improved 6 minute walk score to at least 1275 feet or better, indicating improved strength and endurance.   Time 4   Period Weeks   Status New   PT LONG TERM GOAL #5   Title  Pt will have an improved Lower Extremity Functional Scale score by at least 25-30%, indicating less functional disability due to LE weakness.   Time 4   Period Weeks   Status New               Plan - 09/09/14 1100    Clinical Impression Statement Patient demonstrated improved endurance and tolerated treatment with no significanct rest periods today. She demonstrated improvement in strength and is progressing with home exercises for improving function with sit to stand and walking/daily activities.    Pt will benefit from skilled therapeutic intervention in order to improve on the following deficits Decreased strength;Decreased endurance   Rehab Potential Good   PT Frequency 2x / week   PT Duration 4 weeks   PT Treatment/Interventions Cryotherapy;Moist Heat;Gait training;Therapeutic exercise;Therapeutic activities;Manual techniques;Patient/family education   PT Next Visit Plan progressive strengthening/endurance exercises as indicated with reassessment of exercises given        Problem List Patient Active Problem List   Diagnosis Date Noted  . Fatigue 07/18/2014  . Health care maintenance 07/18/2014  . Environmental allergies 03/14/2014  . Cough 12/27/2013  . Abnormal liver function test 12/27/2013  . Light headedness 12/17/2013  . Sinusitis 12/13/2013  . Tongue lesion 08/02/2013  . Back pain 04/09/2013  . Hypertension 01/14/2012  . Hypercholesterolemia 01/14/2012  . Hyponatremia 01/14/2012  . GERD (gastroesophageal reflux disease) 01/14/2012    Beacher MayBrooks, Margarit Minshall PT 09/10/2014, 1:13 PM  [facility] [address] [place], [place], [address] Phone: [phone]   Fax:  [phone]

## 2014-09-13 ENCOUNTER — Encounter: Payer: Self-pay | Admitting: Physical Therapy

## 2014-09-13 ENCOUNTER — Ambulatory Visit: Payer: PPO | Admitting: Physical Therapy

## 2014-09-13 DIAGNOSIS — M6281 Muscle weakness (generalized): Secondary | ICD-10-CM

## 2014-09-14 NOTE — Therapy (Signed)
[facility] [address] [place], [place], [address] Phone: [phone]   Fax:  [phone]  Physical Therapy Treatment/Discharge Summary  Patient Details  Name: Janet Moran J Jollie MRN: [id number] Date of Birth: [birth year] Referring Provider:  Dale DurhamScott, Charlene, MD  Encounter Date: 09/13/2014      PT End of Session - 09/13/14 1039    Visit Number 6   Number of Visits 6   Date for PT Re-Evaluation 09/14/14   Authorization Type 7   Authorization Time Period 10   PT Start Time 0907   PT Stop Time 0955   PT Time Calculation (min) 48 min      Past Medical History  Diagnosis Date  . Arthritis   . Chicken pox   . GERD (gastroesophageal reflux disease)   . Glaucoma   . Hypertension   . Hypercholesterolemia     Past Surgical History  Procedure Laterality Date  . Tonsillectomy and adenoidectomy  1935  . Abdominal hysterectomy  1976    There were no vitals filed for this visit.  Visit Diagnosis:  Muscle weakness (generalized)      Subjective Assessment - 09/13/14 1037    Subjective Doing well, better and is agreeable to discharge from PT   Currently in Pain? No/denies      Objective:  Treatment:  Re assessed 6 min. Walk test: 1400 feet, without rest periods, mild fatigue Exercises: sitting hip stabilization with blue resistive band x 10 each LE and then 10 reps both LE's,  knee extension x 15 reps each followed by knee flexion with resistive band with cuing to control motion, re assessed exercises for home for strengthening and endurance with minimal cuing required  Response to treatment: Patient demonstrated good understanding of exercises for home to continue to progress endurance and strength in LE's to allow improved transfers sit to stand and walk for community activities and household chores              PT Education - 09/13/14 1038    Education provided Yes   Education Details Re assessed  exercises for home with verbal cuing as needed   Person(s) Educated Patient   Methods Explanation;Verbal cues;Demonstration   Comprehension Verbalized understanding;Returned demonstration;Verbal cues required             PT Long Term Goals - 09/14/14 1507    PT LONG TERM GOAL #1   Title Pt will be educated in and I with a home exercise program to promote overall LE and core strength to allow her greater ease with sit to stand transfers and walking.   Status Achieved   PT LONG TERM GOAL #2   Title Pt will report less frequent or no L lateral hip pain when walking for recreation/exercise.   Status Achieved   PT LONG TERM GOAL #3   Title Pt will report being able to get out of chairs, and from a stooped position when gardening, with less difficulty and less use of UE's.   Status On-going   PT LONG TERM GOAL #4   Title Pt will have an improved 6 minute walk score to at least 1275 feet or better, indicating improved strength and endurance.   Status Achieved   PT LONG TERM GOAL #5   Title Pt will have an improved Lower Extremity Functional Scale score by at least 25-30%, indicating less functional disability due to LE weakness.   Baseline did not re assess   Status  On-going               Plan - 09/13/14 1100    Clinical Impression Statement Patient attended physical therapy for 6 visits beginning 08/17/2014. She has achieved all goals and is independent with home program and is ready for discharge to home program with at least 75-80% improvement  with strength and endurance for walking and performing daily chores, personal care activities. She achieved 6 min. walk test to 1400 feet from 1090 feet initially demonstrating significant improvment with community ambulation.    Rehab Potential Good   Clinical Impairments Affecting Rehab Potential advanced age   PT Frequency 2x / week   PT Duration 4 weeks   PT Treatment/Interventions Cryotherapy;Moist Heat;Gait training;Therapeutic  exercise;Therapeutic activities;Manual techniques;Patient/family education   PT Next Visit Plan Discharge from physical therapy at this time to continue independent home program   Consulted and Agree with Plan of Care Patient        Problem List Patient Active Problem List   Diagnosis Date Noted  . Fatigue 07/18/2014  . Health care maintenance 07/18/2014  . Environmental allergies 03/14/2014  . Cough 12/27/2013  . Abnormal liver function test 12/27/2013  . Light headedness 12/17/2013  . Sinusitis 12/13/2013  . Tongue lesion 08/02/2013  . Back pain 04/09/2013  . Hypertension 01/14/2012  . Hypercholesterolemia 01/14/2012  . Hyponatremia 01/14/2012  . GERD (gastroesophageal reflux disease) 01/14/2012    Beacher May PT 09/14/2014, 3:12 PM  [facility] PHYSICAL AND SPORTS MEDICINE [address], [place], [address] Phone: [phone]   Fax:  [phone]

## 2014-09-15 ENCOUNTER — Other Ambulatory Visit: Payer: Self-pay | Admitting: Internal Medicine

## 2014-09-16 ENCOUNTER — Encounter: Payer: PPO | Admitting: Physical Therapy

## 2014-09-20 ENCOUNTER — Encounter: Payer: PPO | Admitting: Physical Therapy

## 2014-09-23 ENCOUNTER — Encounter: Payer: PPO | Admitting: Physical Therapy

## 2014-09-27 ENCOUNTER — Encounter: Payer: PPO | Admitting: Physical Therapy

## 2014-09-30 ENCOUNTER — Ambulatory Visit (INDEPENDENT_AMBULATORY_CARE_PROVIDER_SITE_OTHER): Payer: PPO | Admitting: Internal Medicine

## 2014-09-30 ENCOUNTER — Encounter: Payer: PPO | Admitting: Physical Therapy

## 2014-09-30 ENCOUNTER — Encounter: Payer: Self-pay | Admitting: Internal Medicine

## 2014-09-30 VITALS — BP 136/69 | HR 61 | Temp 98.2°F | Ht 65.5 in | Wt 150.5 lb

## 2014-09-30 DIAGNOSIS — R5381 Other malaise: Secondary | ICD-10-CM | POA: Diagnosis not present

## 2014-09-30 LAB — COMPREHENSIVE METABOLIC PANEL
ALBUMIN: 3.9 g/dL (ref 3.5–5.2)
ALT: 15 U/L (ref 0–35)
AST: 15 U/L (ref 0–37)
Alkaline Phosphatase: 76 U/L (ref 39–117)
BUN: 11 mg/dL (ref 6–23)
CALCIUM: 9.8 mg/dL (ref 8.4–10.5)
CHLORIDE: 99 meq/L (ref 96–112)
CO2: 29 mEq/L (ref 19–32)
Creatinine, Ser: 0.72 mg/dL (ref 0.40–1.20)
GFR: 81.37 mL/min (ref >60.00)
Glucose, Bld: 82 mg/dL (ref 70–99)
Potassium: 4 mEq/L (ref 3.5–5.1)
Sodium: 132 mEq/L — ABNORMAL LOW (ref 135–145)
Total Bilirubin: 0.5 mg/dL (ref 0.2–1.2)
Total Protein: 6.8 g/dL (ref 6.0–8.3)

## 2014-09-30 LAB — POCT URINALYSIS DIPSTICK
BILIRUBIN UA: NEGATIVE
Blood, UA: NEGATIVE
Glucose, UA: NEGATIVE
KETONES UA: NEGATIVE
Nitrite, UA: NEGATIVE
Protein, UA: NEGATIVE
SPEC GRAV UA: 1.015
Urobilinogen, UA: 0.2
pH, UA: 6.5

## 2014-09-30 LAB — CBC WITH DIFFERENTIAL/PLATELET
BASOS PCT: 1 % (ref 0.0–3.0)
Basophils Absolute: 0.1 10*3/uL (ref 0.0–0.1)
EOS PCT: 2.6 % (ref 0.0–5.0)
Eosinophils Absolute: 0.2 10*3/uL (ref 0.0–0.7)
HEMATOCRIT: 42.3 % (ref 36.0–46.0)
HEMOGLOBIN: 14.3 g/dL (ref 12.0–15.0)
LYMPHS ABS: 1.1 10*3/uL (ref 0.7–4.0)
Lymphocytes Relative: 15.2 % (ref 12.0–46.0)
MCHC: 33.8 g/dL (ref 30.0–36.0)
MCV: 93 fl (ref 78.0–100.0)
MONO ABS: 0.8 10*3/uL (ref 0.1–1.0)
MONOS PCT: 10.5 % (ref 3.0–12.0)
Neutro Abs: 5.2 10*3/uL (ref 1.4–7.7)
Neutrophils Relative %: 70.7 % (ref 43.0–77.0)
PLATELETS: 208 10*3/uL (ref 150.0–400.0)
RBC: 4.55 Mil/uL (ref 3.87–5.11)
RDW: 13.3 % (ref 11.5–15.5)
WBC: 7.4 10*3/uL (ref 4.0–10.5)

## 2014-09-30 LAB — TSH: TSH: 3.29 u[IU]/mL (ref 0.35–4.50)

## 2014-09-30 LAB — VITAMIN B12: VITAMIN B 12: 493 pg/mL (ref 211–911)

## 2014-09-30 NOTE — Assessment & Plan Note (Signed)
Generalized malaise and weakness over last 2 weeks. Exam normal. Will check CBC, CMP, TSH, B12 and urinalysis with labs. Follow up after labs complete.

## 2014-09-30 NOTE — Patient Instructions (Addendum)
We will check labs today to help determine the cause of your fatigue.  We will call you with results.

## 2014-09-30 NOTE — Progress Notes (Signed)
Subjective:    Patient ID: Janet Moran, female    DOB: [birth year], 81 y.o.   MRN: [id number]  HPI  81YO female presents for acute visit.  Feeling generally tired over last few weeks. Last year, had similar episode, and was found to have low sodium and potassium and required hospitalization. No changes to medications. No NVD. No fever. No change in home situation. Feels safe at home. Lives with husband. Children nearby. No falls. No rash. Mild headache last week, but now resolved. No known tick exposure. Some allergy symptoms with congestion, however takes Zyrtec with improvement. Appetite is decreased somewhat, however diet well-rounded.  Wt Readings from Last 3 Encounters:  09/30/14 150 lb 8 oz (68.266 kg)  07/13/14 151 lb 2 oz (68.55 kg)  03/11/14 147 lb 12 oz (67.019 kg)     Past medical, surgical, family and social history per today's encounter.  Review of Systems  Constitutional: Positive for appetite change and fatigue. Negative for fever, chills and unexpected weight change.  Eyes: Negative for visual disturbance.  Respiratory: Negative for shortness of breath.   Cardiovascular: Negative for chest pain and leg swelling.  Gastrointestinal: Negative for nausea, vomiting, abdominal pain, diarrhea and constipation.  Musculoskeletal: Negative for myalgias and arthralgias.  Skin: Negative for color change and rash.  Neurological: Positive for weakness.  Hematological: Negative for adenopathy. Does not bruise/bleed easily.  Psychiatric/Behavioral: Negative for sleep disturbance and dysphoric mood. The patient is not nervous/anxious.        Objective:    BP 136/69 mmHg  Pulse 61  Temp(Src) 98.2 F (36.8 C) (Oral)  Ht 5' 5.5" (1.664 m)  Wt 150 lb 8 oz (68.266 kg)  BMI 24.65 kg/m2  SpO2 97% Physical Exam  Constitutional: She is oriented to person, place, and time. She appears well-developed and well-nourished. No distress.  HENT:  Head: Normocephalic and  atraumatic.  Right Ear: External ear normal.  Left Ear: External ear normal.  Nose: Nose normal.  Mouth/Throat: Oropharynx is clear and moist. No oropharyngeal exudate.  Eyes: Conjunctivae and EOM are normal. Pupils are equal, round, and reactive to light. Right eye exhibits no discharge. Left eye exhibits no discharge. No scleral icterus.  Neck: Normal range of motion. Neck supple. No tracheal deviation present. No thyromegaly present.  Cardiovascular: Normal rate, regular rhythm, normal heart sounds and intact distal pulses.  Exam reveals no gallop and no friction rub.   No murmur heard. Pulmonary/Chest: Effort normal and breath sounds normal. No respiratory distress. She has no wheezes. She has no rales. She exhibits no tenderness.  Abdominal: Soft. Bowel sounds are normal. She exhibits no distension and no mass. There is no tenderness. There is no rebound and no guarding.  Musculoskeletal: Normal range of motion. She exhibits no edema or tenderness.  Lymphadenopathy:    She has no cervical adenopathy.  Neurological: She is alert and oriented to person, place, and time. No cranial nerve deficit. She exhibits normal muscle tone. Coordination normal.  Skin: Skin is warm and dry. No rash noted. She is not diaphoretic. No erythema. No pallor.  Psychiatric: She has a normal mood and affect. Her behavior is normal. Judgment and thought content normal.          Assessment & Plan:  Over of which >50% spent in face-to-face contact with patient discussing plan of care  Problem List Items Addressed This Visit      Unprioritized   Malaise - Primary    Generalized malaise and  weakness over last 2 weeks. Exam normal. Will check CBC, CMP, TSH, B12 and urinalysis with labs. Follow up after labs complete.      Relevant Orders   TSH   Comprehensive metabolic panel   CBC with Differential/Platelet   B12   POCT Urinalysis Dipstick       Return in about 4 weeks (around 10/28/2014) for  Recheck.

## 2014-09-30 NOTE — Progress Notes (Signed)
Pre visit review using our clinic review tool, if applicable. No additional management support is needed unless otherwise documented below in the visit note. 

## 2014-10-04 ENCOUNTER — Encounter: Payer: PPO | Admitting: Physical Therapy

## 2014-10-04 ENCOUNTER — Encounter: Payer: Self-pay | Admitting: Internal Medicine

## 2014-10-04 ENCOUNTER — Ambulatory Visit (INDEPENDENT_AMBULATORY_CARE_PROVIDER_SITE_OTHER): Payer: PPO | Admitting: Podiatry

## 2014-10-04 ENCOUNTER — Other Ambulatory Visit (INDEPENDENT_AMBULATORY_CARE_PROVIDER_SITE_OTHER): Payer: PPO

## 2014-10-04 DIAGNOSIS — E7800 Pure hypercholesterolemia, unspecified: Secondary | ICD-10-CM

## 2014-10-04 DIAGNOSIS — M79673 Pain in unspecified foot: Secondary | ICD-10-CM | POA: Diagnosis not present

## 2014-10-04 DIAGNOSIS — R7989 Other specified abnormal findings of blood chemistry: Secondary | ICD-10-CM | POA: Diagnosis not present

## 2014-10-04 DIAGNOSIS — R945 Abnormal results of liver function studies: Secondary | ICD-10-CM

## 2014-10-04 DIAGNOSIS — B351 Tinea unguium: Secondary | ICD-10-CM

## 2014-10-04 DIAGNOSIS — I10 Essential (primary) hypertension: Secondary | ICD-10-CM

## 2014-10-04 LAB — BASIC METABOLIC PANEL
BUN: 12 mg/dL (ref 6–23)
CO2: 30 mEq/L (ref 19–32)
Calcium: 9.7 mg/dL (ref 8.4–10.5)
Chloride: 100 mEq/L (ref 96–112)
Creatinine, Ser: 0.65 mg/dL (ref 0.40–1.20)
GFR: 91.56 mL/min (ref >60.00)
Glucose, Bld: 79 mg/dL (ref 70–99)
POTASSIUM: 3.7 meq/L (ref 3.5–5.1)
SODIUM: 134 meq/L — AB (ref 135–145)

## 2014-10-04 LAB — HEPATIC FUNCTION PANEL
ALK PHOS: 71 U/L (ref 39–117)
ALT: 12 U/L (ref 0–35)
AST: 14 U/L (ref 0–37)
Albumin: 3.7 g/dL (ref 3.5–5.2)
BILIRUBIN TOTAL: 0.4 mg/dL (ref 0.2–1.2)
Bilirubin, Direct: 0 mg/dL (ref 0.0–0.3)
Total Protein: 6.7 g/dL (ref 6.0–8.3)

## 2014-10-04 LAB — LIPID PANEL
CHOLESTEROL: 157 mg/dL (ref 0–200)
HDL: 52.4 mg/dL (ref >39.00)
LDL Cholesterol: 80 mg/dL (ref 0–99)
NONHDL: 104.6
TRIGLYCERIDES: 123 mg/dL (ref 0.0–149.0)
Total CHOL/HDL Ratio: 3
VLDL: 24.6 mg/dL (ref 0.0–40.0)

## 2014-10-04 NOTE — Progress Notes (Signed)
She presents today complaining of painful elongated toenails.  Objective: Pulses are palpable bilateral. Nails are thick yellow dystrophic onychomycotic and painful palpation.  Assessment: Pain in limb secondary to onychomycosis 1 through 5 bilateral.  Plan: Debridement of nails 1 through 5 bilateral covered service secondary to pain.

## 2014-10-06 NOTE — Telephone Encounter (Signed)
Unread mychart message mailed to patient 

## 2014-10-07 ENCOUNTER — Encounter: Payer: PPO | Admitting: Physical Therapy

## 2014-11-05 ENCOUNTER — Other Ambulatory Visit: Payer: Self-pay | Admitting: Internal Medicine

## 2014-11-10 ENCOUNTER — Telehealth: Payer: Self-pay | Admitting: *Deleted

## 2014-11-10 ENCOUNTER — Telehealth: Payer: Self-pay | Admitting: Internal Medicine

## 2014-11-10 NOTE — Telephone Encounter (Signed)
Melissa N, is cancelling lab appoint.

## 2014-11-10 NOTE — Telephone Encounter (Signed)
Pt scheduled for labs, please enter orders

## 2014-11-10 NOTE — Telephone Encounter (Signed)
Left msg for pt to call office. Dr. Lorin Picket advised that she does not need lab work today/msn

## 2014-11-10 NOTE — Telephone Encounter (Signed)
Please call and notify pt she does not need labs tomorrow.  She just had labs in June.

## 2014-11-11 ENCOUNTER — Other Ambulatory Visit: Payer: Self-pay | Admitting: Internal Medicine

## 2014-11-11 ENCOUNTER — Telehealth: Payer: Self-pay | Admitting: *Deleted

## 2014-11-11 ENCOUNTER — Other Ambulatory Visit: Payer: PPO

## 2014-11-11 DIAGNOSIS — I10 Essential (primary) hypertension: Secondary | ICD-10-CM

## 2014-11-11 DIAGNOSIS — R945 Abnormal results of liver function studies: Secondary | ICD-10-CM

## 2014-11-11 DIAGNOSIS — R7989 Other specified abnormal findings of blood chemistry: Secondary | ICD-10-CM

## 2014-11-11 DIAGNOSIS — E7800 Pure hypercholesterolemia, unspecified: Secondary | ICD-10-CM

## 2014-11-11 NOTE — Progress Notes (Signed)
Orders placed for labs

## 2014-11-11 NOTE — Telephone Encounter (Signed)
Pt on lab schedule, please enter orders 

## 2014-11-16 ENCOUNTER — Encounter: Payer: Self-pay | Admitting: Internal Medicine

## 2014-11-16 ENCOUNTER — Ambulatory Visit (INDEPENDENT_AMBULATORY_CARE_PROVIDER_SITE_OTHER): Payer: PPO | Admitting: Internal Medicine

## 2014-11-16 VITALS — BP 124/62 | HR 62 | Temp 97.9°F | Ht 65.5 in | Wt 150.1 lb

## 2014-11-16 DIAGNOSIS — R7989 Other specified abnormal findings of blood chemistry: Secondary | ICD-10-CM | POA: Diagnosis not present

## 2014-11-16 DIAGNOSIS — I10 Essential (primary) hypertension: Secondary | ICD-10-CM | POA: Diagnosis not present

## 2014-11-16 DIAGNOSIS — Z0000 Encounter for general adult medical examination without abnormal findings: Secondary | ICD-10-CM

## 2014-11-16 DIAGNOSIS — Z9109 Other allergy status, other than to drugs and biological substances: Secondary | ICD-10-CM

## 2014-11-16 DIAGNOSIS — K219 Gastro-esophageal reflux disease without esophagitis: Secondary | ICD-10-CM

## 2014-11-16 DIAGNOSIS — E871 Hypo-osmolality and hyponatremia: Secondary | ICD-10-CM

## 2014-11-16 DIAGNOSIS — M549 Dorsalgia, unspecified: Secondary | ICD-10-CM

## 2014-11-16 DIAGNOSIS — R945 Abnormal results of liver function studies: Secondary | ICD-10-CM

## 2014-11-16 DIAGNOSIS — E7800 Pure hypercholesterolemia, unspecified: Secondary | ICD-10-CM

## 2014-11-16 DIAGNOSIS — Z91048 Other nonmedicinal substance allergy status: Secondary | ICD-10-CM

## 2014-11-16 NOTE — Progress Notes (Signed)
Patient ID: Janet Moran, female   DOB: [birth year], 81 y.o.   MRN: [id number]   Subjective:    Patient ID: Janet Moran, female    DOB: 1926/02/09, 81 y.o.   MRN: [id number]  HPI  Patient here for a scheduled follow up.  Not exercising like she previously had been.  Husband has knee issues and they are no longer walking in the mall.  Plans to start more walking.  No cardiac symptoms with increased activity or exertion.  No sob reported.  Eating and drinking well.  Bowels doing well.  Some low back discomfort.  Flares at times.  Desires no further intervention.     Past Medical History  Diagnosis Date  . Arthritis   . Chicken pox   . GERD (gastroesophageal reflux disease)   . Glaucoma   . Hypertension   . Hypercholesterolemia     Outpatient Encounter Prescriptions as of 11/16/2014  Medication Sig  . amLODipine (NORVASC) 5 MG tablet TAKE ONE (1) TABLET BY MOUTH EVERY DAY  . amoxicillin (AMOXIL) 875 MG tablet Prior to dental procedures  . aspirin EC 81 MG tablet Take 81 mg by mouth daily.  . calcium carbonate (OS-CAL) 600 MG TABS Take 600 mg by mouth 2 (two) times daily with a meal. Take 1 tablet by mouth twice daily with food  . latanoprost (XALATAN) 0.005 % ophthalmic solution Place 1 drop into both eyes at bedtime.  Marland Kitchen losartan (COZAAR) 50 MG tablet TAKE ONE (1) TABLET BY MOUTH EVERY DAY  . losartan (COZAAR) 50 MG tablet TAKE ONE (1) TABLET EACH DAY  . metoprolol succinate (TOPROL XL) 25 MG 24 hr tablet Take 1 tablet (25 mg total) by mouth daily.  . naproxen sodium (ANAPROX) 220 MG tablet Take 220 mg by mouth 2 (two) times daily with a meal.  . omeprazole (PRILOSEC) 20 MG capsule Take 20 mg by mouth daily.  . simvastatin (ZOCOR) 20 MG tablet TAKE ONE TABLET BY MOUTH EVERY EVENING.  Marland Kitchen timolol (BETIMOL) 0.25 % ophthalmic solution Place 1-2 drops into the right eye 2 (two) times daily.   Marland Kitchen loratadine (CLARITIN) 10 MG tablet Take 10 mg by mouth daily.   No facility-administered  encounter medications on file as of 11/16/2014.    Review of Systems  Constitutional: Negative for appetite change and unexpected weight change.  HENT: Negative for congestion and sinus pressure.   Respiratory: Negative for cough, chest tightness and shortness of breath.   Cardiovascular: Negative for chest pain, palpitations and leg swelling.  Gastrointestinal: Negative for nausea, vomiting, abdominal pain and diarrhea.  Musculoskeletal:       Back pain stable.  Flares intermittently.  Uses a rice bag.  Takes occasional advil.  Does not want any further intervention at this time.    Skin: Negative for color change and rash.  Neurological: Negative for dizziness, light-headedness and headaches.  Psychiatric/Behavioral: Negative for dysphoric mood and agitation.       Objective:     Blood pressure recheck:  138/78  Physical Exam  Constitutional: She appears well-developed and well-nourished. No distress.  HENT:  Nose: Nose normal.  Mouth/Throat: Oropharynx is clear and moist.  Neck: Neck supple. No thyromegaly present.  Cardiovascular: Normal rate and regular rhythm.   Pulmonary/Chest: Breath sounds normal. No respiratory distress. She has no wheezes.  Abdominal: Soft. Bowel sounds are normal. There is no tenderness.  Musculoskeletal: She exhibits no edema or tenderness.  Lymphadenopathy:    She has no cervical adenopathy.  Skin: No rash noted. No erythema.  Psychiatric: She has a normal mood and affect. Her behavior is normal.    BP 124/62 mmHg  Pulse 62  Temp(Src) 97.9 F (36.6 C) (Oral)  Ht 5' 5.5" (1.664 m)  Wt 150 lb 2 oz (68.096 kg)  BMI 24.59 kg/m2  SpO2 96% Wt Readings from Last 3 Encounters:  11/16/14 150 lb 2 oz (68.096 kg)  09/30/14 150 lb 8 oz (68.266 kg)  07/13/14 151 lb 2 oz (68.55 kg)     Lab Results  Component Value Date   WBC 7.4 09/30/2014   HGB 14.3 09/30/2014   HCT 42.3 09/30/2014   PLT 208.0 09/30/2014   GLUCOSE 79 10/04/2014   CHOL 157  10/04/2014   TRIG 123.0 10/04/2014   HDL 52.40 10/04/2014   LDLCALC 80 10/04/2014   ALT 12 10/04/2014   AST 14 10/04/2014   NA 134* 10/04/2014   K 3.7 10/04/2014   CL 100 10/04/2014   CREATININE 0.65 10/04/2014   BUN 12 10/04/2014   CO2 30 10/04/2014   TSH 3.29 09/30/2014       Assessment & Plan:   Problem List Items Addressed This Visit    Abnormal liver function test    Recent liver panel wnl.        Back pain    Some flares at times.  She desires no further intervention.  Follow.        Environmental allergies    Overall stable.  No reported symptoms today.       GERD (gastroesophageal reflux disease)    On omeprazole.  Symptoms controlled.  Follow.        Health care maintenance    Physical 07/13/14.  Mammogram 04/28/14 - Birads I.  Colonoscopy 11.8/12 - normal.        Hypercholesterolemia    On simvastatin.  Low cholesterol diet and exercise.  Follow lipid panel and liver function tests.        Relevant Orders   Lipid panel   Hepatic function panel   Hypertension - Primary    Blood pressure under good control.  Continue same medication regimen.  Follow pressures.  Follow metabolic panel.        Relevant Orders   Basic metabolic panel   Hyponatremia    Recent sodium stable and 134.  Follow.            Dale [place], MD

## 2014-11-16 NOTE — Progress Notes (Signed)
Pre visit review using our clinic review tool, if applicable. No additional management support is needed unless otherwise documented below in the visit note. 

## 2014-11-17 ENCOUNTER — Encounter: Payer: Self-pay | Admitting: Internal Medicine

## 2014-11-17 NOTE — Assessment & Plan Note (Signed)
Overall stable.  No reported symptoms today.

## 2014-11-17 NOTE — Assessment & Plan Note (Signed)
Recent liver panel wnl.  

## 2014-11-17 NOTE — Assessment & Plan Note (Signed)
Blood pressure under good control.  Continue same medication regimen.  Follow pressures.  Follow metabolic panel.   

## 2014-11-17 NOTE — Assessment & Plan Note (Signed)
On simvastatin.  Low cholesterol diet and exercise.  Follow lipid panel and liver function tests.   

## 2014-11-17 NOTE — Assessment & Plan Note (Signed)
On omeprazole.  Symptoms controlled.  Follow.   

## 2014-11-17 NOTE — Assessment & Plan Note (Signed)
Some flares at times.  She desires no further intervention.  Follow.

## 2014-11-17 NOTE — Assessment & Plan Note (Signed)
Physical 07/13/14.  Mammogram 04/28/14 - Birads I.  Colonoscopy 11.8/12 - normal.

## 2014-11-17 NOTE — Assessment & Plan Note (Signed)
Recent sodium stable and 134.  Follow.

## 2015-01-03 ENCOUNTER — Ambulatory Visit (INDEPENDENT_AMBULATORY_CARE_PROVIDER_SITE_OTHER): Payer: PPO | Admitting: Podiatry

## 2015-01-03 DIAGNOSIS — B351 Tinea unguium: Secondary | ICD-10-CM | POA: Diagnosis not present

## 2015-01-03 DIAGNOSIS — M79673 Pain in unspecified foot: Secondary | ICD-10-CM

## 2015-01-03 NOTE — Progress Notes (Signed)
She presents today complaining of painful elongated toenails.  Objective: Pulses are palpable bilateral. Nails are thick yellow dystrophic onychomycotic and painful palpation.  Assessment: Pain in limb secondary to onychomycosis 1 through 5 bilateral.  Plan: Debridement of nails 1 through 5 bilateral covered service secondary to pain.  Arbutus Ped DPM

## 2015-01-30 ENCOUNTER — Emergency Department
Admission: EM | Admit: 2015-01-30 | Discharge: 2015-01-30 | Disposition: A | Discharge status: Home / Self Care | Payer: PPO | Attending: Emergency Medicine | Admitting: Emergency Medicine

## 2015-01-30 ENCOUNTER — Encounter: Payer: Self-pay | Admitting: Emergency Medicine

## 2015-01-30 ENCOUNTER — Emergency Department: Payer: PPO

## 2015-01-30 DIAGNOSIS — S92354A Nondisplaced fracture of fifth metatarsal bone, right foot, initial encounter for closed fracture: Secondary | ICD-10-CM | POA: Diagnosis not present

## 2015-01-30 DIAGNOSIS — Y9389 Activity, other specified: Secondary | ICD-10-CM | POA: Insufficient documentation

## 2015-01-30 DIAGNOSIS — S8264XA Nondisplaced fracture of lateral malleolus of right fibula, initial encounter for closed fracture: Secondary | ICD-10-CM | POA: Insufficient documentation

## 2015-01-30 DIAGNOSIS — S8261XA Displaced fracture of lateral malleolus of right fibula, initial encounter for closed fracture: Secondary | ICD-10-CM

## 2015-01-30 DIAGNOSIS — X58XXXA Exposure to other specified factors, initial encounter: Secondary | ICD-10-CM | POA: Diagnosis not present

## 2015-01-30 DIAGNOSIS — Y9289 Other specified places as the place of occurrence of the external cause: Secondary | ICD-10-CM | POA: Diagnosis not present

## 2015-01-30 DIAGNOSIS — I10 Essential (primary) hypertension: Secondary | ICD-10-CM | POA: Insufficient documentation

## 2015-01-30 DIAGNOSIS — Z79899 Other long term (current) drug therapy: Secondary | ICD-10-CM | POA: Insufficient documentation

## 2015-01-30 DIAGNOSIS — Z792 Long term (current) use of antibiotics: Secondary | ICD-10-CM | POA: Diagnosis not present

## 2015-01-30 DIAGNOSIS — Y998 Other external cause status: Secondary | ICD-10-CM | POA: Diagnosis not present

## 2015-01-30 DIAGNOSIS — S92301A Fracture of unspecified metatarsal bone(s), right foot, initial encounter for closed fracture: Secondary | ICD-10-CM

## 2015-01-30 DIAGNOSIS — S99921A Unspecified injury of right foot, initial encounter: Secondary | ICD-10-CM | POA: Diagnosis present

## 2015-01-30 DIAGNOSIS — Z791 Long term (current) use of non-steroidal anti-inflammatories (NSAID): Secondary | ICD-10-CM | POA: Insufficient documentation

## 2015-01-30 MED ORDER — TRAMADOL HCL 50 MG PO TABS: 50.0000 mg | ORAL_TABLET | Freq: Four times a day (QID) | ORAL | Status: DC | PRN

## 2015-01-30 MED ORDER — TRAMADOL HCL 50 MG PO TABS
50.0000 mg | ORAL_TABLET | Freq: Once | ORAL | Status: AC
Start: 2015-01-30 — End: 2015-01-30
  Administered 2015-01-30: 50 mg via ORAL
  Filled 2015-01-30: qty 1

## 2015-01-30 NOTE — ED Notes (Signed)
Pt states she stood up from a sitting position and when she put pressure on her right foot she felt  A pop. Right foot hurts worse when ambulating.

## 2015-01-30 NOTE — ED Provider Notes (Signed)
[facility] Emergency Department Provider Note ____________________________________________  Time seen: Approximately 8:26 AM  I have reviewed the triage vital signs and the nursing notes.   HISTORY  Chief Complaint Fall and Foot Pain   HPI Janet Moran is a 81 y.o. female resents to the emergency department for evaluation of right foot pain. She states last night she started to stand up from her recliner and her right foot "buckled" and she felt a pop. She states that through the night it was painful and when she awakened this morning it was swollen and blue. She denies previous injury to the right ankle.   Past Medical History  Diagnosis Date  . Arthritis   . Chicken pox   . GERD (gastroesophageal reflux disease)   . Glaucoma   . Hypertension   . Hypercholesterolemia     Patient Active Problem List   Diagnosis Date Noted  . Malaise 09/30/2014  . Fatigue 07/18/2014  . Health care maintenance 07/18/2014  . Environmental allergies 03/14/2014  . Cough 12/27/2013  . Abnormal liver function test 12/27/2013  . Light headedness 12/17/2013  . Sinusitis 12/13/2013  . Tongue lesion 08/02/2013  . Back pain 04/09/2013  . Hypertension 01/14/2012  . Hypercholesterolemia 01/14/2012  . Hyponatremia 01/14/2012  . GERD (gastroesophageal reflux disease) 01/14/2012    Past Surgical History  Procedure Laterality Date  . Tonsillectomy and adenoidectomy  1935  . Abdominal hysterectomy  1976    Current Outpatient Rx  Name  Route  Sig  Dispense  Refill  . amLODipine (NORVASC) 5 MG tablet      TAKE ONE (1) TABLET BY MOUTH EVERY DAY   30 tablet   6   . amoxicillin (AMOXIL) 875 MG tablet      Prior to dental procedures         . aspirin EC 81 MG tablet   Oral   Take 81 mg by mouth daily.         . calcium carbonate (OS-CAL) 600 MG TABS   Oral   Take 600 mg by mouth 2 (two) times daily with a meal. Take 1 tablet by mouth twice daily with food          . latanoprost (XALATAN) 0.005 % ophthalmic solution   Both Eyes   Place 1 drop into both eyes at bedtime.         Marland Kitchen loratadine (CLARITIN) 10 MG tablet   Oral   Take 10 mg by mouth daily.         Marland Kitchen losartan (COZAAR) 50 MG tablet      TAKE ONE (1) TABLET BY MOUTH EVERY DAY   30 tablet   6   . losartan (COZAAR) 50 MG tablet      TAKE ONE (1) TABLET EACH DAY   30 tablet   6   . metoprolol succinate (TOPROL XL) 25 MG 24 hr tablet   Oral   Take 1 tablet (25 mg total) by mouth daily.   30 tablet   5   . naproxen sodium (ANAPROX) 220 MG tablet   Oral   Take 220 mg by mouth 2 (two) times daily with a meal.         . omeprazole (PRILOSEC) 20 MG capsule   Oral   Take 20 mg by mouth daily.         . simvastatin (ZOCOR) 20 MG tablet      TAKE ONE TABLET BY MOUTH EVERY EVENING.  30 tablet   5   . timolol (BETIMOL) 0.25 % ophthalmic solution   Right Eye   Place 1-2 drops into the right eye 2 (two) times daily.          . traMADol (ULTRAM) 50 MG tablet   Oral   Take 1 tablet (50 mg total) by mouth every 6 (six) hours as needed.   15 tablet   0     Allergies Avelox; Crestor; Pravachol; and Vytorin  Family History  Problem Relation Age of Onset  . Stroke Mother   . Stroke Father   . Diabetes Maternal Grandmother   . Diabetes Maternal Grandfather   . Lung cancer Sister     died [id number]    Social History Social History  Substance Use Topics  . Smoking status: Never Smoker   . Smokeless tobacco: Never Used  . Alcohol Use: No    Review of Systems Constitutional: No recent illness. Eyes: No visual changes. ENT: No sore throat. Cardiovascular: Denies chest pain or palpitations. Respiratory: Denies shortness of breath. Gastrointestinal: No abdominal pain.  Genitourinary: Negative for dysuria. Musculoskeletal: Pain in right ankle and foot Skin: Negative for rash. Neurological: Negative for headaches, focal weakness or numbness. 10-point ROS  otherwise negative.  ____________________________________________   PHYSICAL EXAM:  VITAL SIGNS: ED Triage Vitals  Enc Vitals Group     BP 01/30/15 0814 130/56 mmHg     Pulse Rate 01/30/15 0814 66     Resp 01/30/15 0814 18     Temp 01/30/15 0814 98.4 F (36.9 C)     Temp Source 01/30/15 0814 Oral     SpO2 01/30/15 0814 99 %     Weight 01/30/15 0803 145 lb (65.772 kg)     Height 01/30/15 0803 5\' 6"  (1.676 m)     Head Cir --      Peak Flow --      Pain Score 01/30/15 0803 6     Pain Loc --      Pain Edu? --      Excl. in GC? --     Constitutional: Alert and oriented. Well appearing and in no acute distress. Eyes: Conjunctivae are normal. EOMI. Head: Atraumatic. Nose: No congestion/rhinnorhea. Neck: No stridor.  Respiratory: Normal respiratory effort.   Musculoskeletal: Ecchymosis noted to the right foot and ankle. Significant tenderness and swelling noted to the lateral aspect of the fifth metatarsal. There is also mild tenderness over the lateral malleolus with swelling. Neurologic:  Normal speech and language. No gross focal neurologic deficits are appreciated. Speech is normal. No gait instability. Skin:  Skin is warm, dry and intact. Ecchymosis noted to the right foot and ankle with swelling Psychiatric: Mood and affect are normal. Speech and behavior are normal.  ____________________________________________   LABS (all labs ordered are listed, but only abnormal results are displayed)  Labs Reviewed - No data to display ____________________________________________  RADIOLOGY  Acute nondisplaced fractures of the right fifth metatarsal base and the right lateral malleolus.  I, Kem Boroughsari Ashantia Amaral, personally viewed and evaluated these images (plain radiographs) as part of my medical decision making.   ____________________________________________   PROCEDURES  Procedure(s) performed:   SPLINT APPLICATION Authorized by: Kem Boroughsari Annalisa Colonna Consent: Verbal consent  obtained. Risks and benefits: risks, benefits and alternatives were discussed Consent given by: patient Splint applied by: Mellody DanceKeith, ER technician Location details: right foot/ankle Splint type: posterior Supplies used: OCL and ACE Post-procedure: The splinted body part was neurovascularly unchanged following the procedure. Patient tolerance:  Patient tolerated the procedure well with no immediate complications.      ____________________________________________   INITIAL IMPRESSION / ASSESSMENT AND PLAN / ED COURSE  Pertinent labs & imaging results that were available during my care of the patient were reviewed by me and considered in my medical decision making (see chart for details).  Patient was instructed to follow up with orthopedics. She was advised to call tomorrow to schedule an appointment. She was advised to return to the emergency department for symptoms that change or worsen if she is unable to see the orthopedist. ____________________________________________   FINAL CLINICAL IMPRESSION(S) / ED DIAGNOSES  Final diagnoses:  Metatarsal fracture, right, closed, initial encounter  Lateral malleolar fracture, right, closed, initial encounter       Chinita Pester, FNP 01/30/15 1554  Governor Rooks, MD 02/04/15 [phone]

## 2015-01-30 NOTE — Discharge Instructions (Signed)
Ankle Fracture A fracture is a break in a bone. The ankle joint is made up of three bones. These include the lower (distal)sections of your lower leg bones, called the tibia and fibula, along with a bone in your foot, called the talus. Depending on how bad the break is and if more than one ankle joint bone is broken, a cast or splint is used to protect and keep your injured bone from moving while it heals. Sometimes, surgery is required to help the fracture heal properly.  There are two general types of fractures:  Stable fracture. This includes a single fracture line through one bone, with no injury to ankle ligaments. A fracture of the talus that does not have any displacement (movement of the bone on either side of the fracture line) is also stable.  Unstable fracture. This includes more than one fracture line through one or more bones in the ankle joint. It also includes fractures that have displacement of the bone on either side of the fracture line. CAUSES  A direct blow to the ankle.   Quickly and severely twisting your ankle.  Trauma, such as a car accident or falling from a significant height. RISK FACTORS You may be at a higher risk of ankle fracture if:  You have certain medical conditions.  You are involved in high-impact sports.  You are involved in a high-impact car accident. SIGNS AND SYMPTOMS   Tender and swollen ankle.  Bruising around the injured ankle.  Pain on movement of the ankle.  Difficulty walking or putting weight on the ankle.  A cold foot below the site of the ankle injury. This can occur if the blood vessels passing through your injured ankle were also damaged.  Numbness in the foot below the site of the ankle injury. DIAGNOSIS  An ankle fracture is usually diagnosed with a physical exam and X-rays. A CT scan may also be required for complex fractures. TREATMENT  Stable fractures are treated with a cast or splint and using crutches to avoid putting  weight on your injured ankle. This is followed by an ankle strengthening program. Some patients require a special type of cast, depending on other medical problems they may have. Unstable fractures require surgery to ensure the bones heal properly. Your health care provider will tell you what type of fracture you have and the best treatment for your condition. HOME CARE INSTRUCTIONS   Review correct crutch use with your health care provider and use your crutches as directed. Safe use of crutches is extremely important. Misuse of crutches can cause you to fall or cause injury to nerves in your hands or armpits.  Do not put weight or pressure on the injured ankle until directed by your health care provider.  To lessen the swelling, keep the injured leg elevated while sitting or lying down.  Apply ice to the injured area:  Put ice in a plastic bag.  Place a towel between your cast and the bag.  Leave the ice on for 20 minutes, 2-3 times a day.  If you have a plaster or fiberglass cast:  Do not try to scratch the skin under the cast with any objects. This can increase your risk of skin infection.  Check the skin around the cast every day. You may put lotion on any red or sore areas.  Keep your cast dry and clean.  If you have a plaster splint:  Wear the splint as directed.  You may loosen the elastic   around the splint if your toes become numb, tingle, or turn cold or blue.  Do not put pressure on any part of your cast or splint; it may break. Rest your cast only on a pillow the first 24 hours until it is fully hardened.  Your cast or splint can be protected during bathing with a plastic bag sealed to your skin with medical tape. Do not lower the cast or splint into water.  Take medicines as directed by your health care provider. Only take over-the-counter or prescription medicines for pain, discomfort, or fever as directed by your health care provider.  Do not drive a vehicle until  your health care provider specifically tells you it is safe to do so.  If your health care provider has given you a follow-up appointment, it is very important to keep that appointment. Not keeping the appointment could result in a chronic or permanent injury, pain, and disability. If you have any problem keeping the appointment, call the facility for assistance. SEEK MEDICAL CARE IF: You develop increased swelling or discomfort. SEEK IMMEDIATE MEDICAL CARE IF:   Your cast gets damaged or breaks.  You have continued severe pain.  You develop new pain or swelling after the cast was put on.  Your skin or toenails below the injury turn blue or gray.  Your skin or toenails below the injury feel cold, numb, or have loss of sensitivity to touch.  There is a bad smell or pus draining from under the cast. MAKE SURE YOU:   Understand these instructions.  Will watch your condition.  Will get help right away if you are not doing well or get worse.   This information is not intended to replace advice given to you by your health care provider. Make sure you discuss any questions you have with your health care provider.   Document Released: 03/23/2000 Document Revised: 03/31/2013 Document Reviewed: 10/23/2012 Elsevier Interactive Patient Education 2016 [employer]  

## 2015-01-30 NOTE — ED Notes (Signed)
X ray in room.

## 2015-01-31 ENCOUNTER — Other Ambulatory Visit: Payer: Self-pay | Admitting: Internal Medicine

## 2015-02-01 ENCOUNTER — Encounter: Payer: Self-pay | Admitting: Sports Medicine

## 2015-02-01 ENCOUNTER — Ambulatory Visit (INDEPENDENT_AMBULATORY_CARE_PROVIDER_SITE_OTHER): Payer: PPO | Admitting: Sports Medicine

## 2015-02-01 DIAGNOSIS — M79671 Pain in right foot: Secondary | ICD-10-CM | POA: Diagnosis not present

## 2015-02-01 DIAGNOSIS — R58 Hemorrhage, not elsewhere classified: Secondary | ICD-10-CM | POA: Diagnosis not present

## 2015-02-01 DIAGNOSIS — M79604 Pain in right leg: Secondary | ICD-10-CM | POA: Diagnosis not present

## 2015-02-01 DIAGNOSIS — S92301A Fracture of unspecified metatarsal bone(s), right foot, initial encounter for closed fracture: Secondary | ICD-10-CM

## 2015-02-01 DIAGNOSIS — S82891A Other fracture of right lower leg, initial encounter for closed fracture: Secondary | ICD-10-CM

## 2015-02-01 DIAGNOSIS — R609 Edema, unspecified: Secondary | ICD-10-CM | POA: Diagnosis not present

## 2015-02-01 NOTE — Patient Instructions (Signed)
Ankle Fracture A fracture is a break in a bone. The ankle joint is made up of three bones. These include the lower (distal)sections of your lower leg bones, called the tibia and fibula, along with a bone in your foot, called the talus. Depending on how bad the break is and if more than one ankle joint bone is broken, a cast or splint is used to protect and keep your injured bone from moving while it heals. Sometimes, surgery is required to help the fracture heal properly.  There are two general types of fractures:  Stable fracture. This includes a single fracture line through one bone, with no injury to ankle ligaments. A fracture of the talus that does not have any displacement (movement of the bone on either side of the fracture line) is also stable.  Unstable fracture. This includes more than one fracture line through one or more bones in the ankle joint. It also includes fractures that have displacement of the bone on either side of the fracture line. CAUSES  A direct blow to the ankle.   Quickly and severely twisting your ankle.  Trauma, such as a car accident or falling from a significant height. RISK FACTORS You may be at a higher risk of ankle fracture if:  You have certain medical conditions.  You are involved in high-impact sports.  You are involved in a high-impact car accident. SIGNS AND SYMPTOMS   Tender and swollen ankle.  Bruising around the injured ankle.  Pain on movement of the ankle.  Difficulty walking or putting weight on the ankle.  A cold foot below the site of the ankle injury. This can occur if the blood vessels passing through your injured ankle were also damaged.  Numbness in the foot below the site of the ankle injury. DIAGNOSIS  An ankle fracture is usually diagnosed with a physical exam and X-rays. A CT scan may also be required for complex fractures. TREATMENT  Stable fractures are treated with a cast or splint and using crutches to avoid putting  weight on your injured ankle. This is followed by an ankle strengthening program. Some patients require a special type of cast, depending on other medical problems they may have. Unstable fractures require surgery to ensure the bones heal properly. Your health care provider will tell you what type of fracture you have and the best treatment for your condition. HOME CARE INSTRUCTIONS   Review correct crutch use with your health care provider and use your crutches as directed. Safe use of crutches is extremely important. Misuse of crutches can cause you to fall or cause injury to nerves in your hands or armpits.  Do not put weight or pressure on the injured ankle until directed by your health care provider.  To lessen the swelling, keep the injured leg elevated while sitting or lying down.  Apply ice to the injured area:  Put ice in a plastic bag.  Place a towel between your cast and the bag.  Leave the ice on for 15-20 minutes, 2-3 times a day.  If you have a plaster or fiberglass cast:  Do not try to scratch the skin under the cast with any objects. This can increase your risk of skin infection.  Check the skin around the cast every day. You may put lotion on any red or sore areas.  Keep your cast dry and clean.  If you have a plaster splint:  Wear the splint as directed.  You may loosen the elastic  around the splint if your toes become numb, tingle, or turn cold or blue.  Do not put pressure on any part of your cast or splint; it may break. Rest your cast only on a pillow the first 24 hours until it is fully hardened.  Your cast or splint can be protected during bathing with a plastic bag sealed to your skin with medical tape. Do not lower the cast or splint into water.  Take medicines as directed by your health care provider. Only take over-the-counter or prescription medicines for pain, discomfort, or fever as directed by your health care provider.  Do not drive a vehicle  until your health care provider specifically tells you it is safe to do so.  If your health care provider has given you a follow-up appointment, it is very important to keep that appointment. Not keeping the appointment could result in a chronic or permanent injury, pain, and disability. If you have any problem keeping the appointment, call the facility for assistance. SEEK MEDICAL CARE IF: You develop increased swelling or discomfort. SEEK IMMEDIATE MEDICAL CARE IF:   Your cast gets damaged or breaks.  You have continued severe pain.  You develop new pain or swelling after the cast was put on.  Your skin or toenails below the injury turn blue or gray.  Your skin or toenails below the injury feel cold, numb, or have loss of sensitivity to touch.  There is a bad smell or pus draining from under the cast. MAKE SURE YOU:   Understand these instructions.  Will watch your condition.  Will get help right away if you are not doing well or get worse.   This information is not intended to replace advice given to you by your health care provider. Make sure you discuss any questions you have with your health care provider.   Document Released: 03/23/2000 Document Revised: 03/31/2013 Document Reviewed: 10/23/2012 Elsevier Interactive Patient Education 2016 [employer]  Metatarsal Fracture A metatarsal fracture is a break in a metatarsal bone. Metatarsal bones connect your toe bones to your ankle bones. CAUSES This type of fracture may be caused by:  A sudden twisting of your foot.  A fall onto your foot.  Overuse or repetitive exercise. RISK FACTORS This condition is more likely to develop in people who:  Play contact sports.  Have a bone disease.  Have a low calcium level. SYMPTOMS Symptoms of this condition include:  Pain that is worse when walking or standing.  Pain when pressing on the foot or moving the toes.  Swelling.  Bruising on the top or bottom of the  foot.  A foot that appears shorter than the other one. DIAGNOSIS This condition is diagnosed with a physical exam. You may also have imaging tests, such as:  X-rays.  A CT scan.  MRI. TREATMENT Treatment for this condition depends on its severity and whether a bone has moved out of place. Treatment may involve:  Rest.  Wearing foot support such as a cast, splint, or boot for several weeks.  Using crutches.  Surgery to move bones back into the right position. Surgery is usually needed if there are many pieces of broken bone or bones that are very out of place (displaced fracture).  Physical therapy. This may be needed to help you regain full movement and strength in your foot. You will need to return to your health care provider to have X-rays taken until your bones heal. Your health care provider will look  at the X-rays to make sure that your foot is healing well. HOME CARE INSTRUCTIONS  If You Have a Cast:  Do not stick anything inside the cast to scratch your skin. Doing that increases your risk of infection.  Check the skin around the cast every day. Report any concerns to your health care provider. You may put lotion on dry skin around the edges of the cast. Do not apply lotion to the skin underneath the cast.  Keep the cast clean and dry. If You Have a Splint or a Supportive Boot:  Wear it as directed by your health care provider. Remove it only as directed by your health care provider.  Loosen it if your toes become numb and tingle, or if they turn cold and blue.  Keep it clean and dry. Bathing  Do not take baths, swim, or use a hot tub until your health care provider approves. Ask your health care provider if you can take showers. You may only be allowed to take sponge baths for bathing.  If your health care provider approves bathing and showering, cover the cast or splint with a watertight plastic bag to protect it from water. Do not let the cast or splint get  wet. Managing Pain, Stiffness, and Swelling  If directed, apply ice to the injured area (if you have a splint, not a cast).  Put ice in a plastic bag.  Place a towel between your skin and the bag.  Leave the ice on for 20 minutes, 2-3 times per day.  Move your toes often to avoid stiffness and to lessen swelling.  Raise (elevate) the injured area above the level of your heart while you are sitting or lying down. Driving  Do not drive or operate heavy machinery while taking pain medicine.  Do not drive while wearing foot support on a foot that you use for driving. Activity  Return to your normal activities as directed by your health care provider. Ask your health care provider what activities are safe for you.  Perform exercises as directed by your health care provider or physical therapist. Safety  Do not use the injured foot to support your body weight until your health care provider says that you can. Use crutches as directed by your health care provider. General Instructions  Do not put pressure on any part of the cast or splint until it is fully hardened. This may take several hours.  Do not use any tobacco products, including cigarettes, chewing tobacco, or e-cigarettes. Tobacco can delay bone healing. If you need help quitting, ask your health care provider.  Take medicines only as directed by your health care provider.  Keep all follow-up visits as directed by your health care provider. This is important. SEEK MEDICAL CARE IF:  You have a fever.  Your cast, splint, or boot is too loose or too tight.  Your cast, splint, or boot is damaged.  Your pain medicine is not helping.  You have pain, tingling, or numbness in your foot that is not going away. SEEK IMMEDIATE MEDICAL CARE IF:  You have severe pain.  You have tingling or numbness in your foot that is getting worse.  Your foot feels cold or becomes numb.  Your foot changes color.   This information is  not intended to replace advice given to you by your health care provider. Make sure you discuss any questions you have with your health care provider.   Document Released: 12/16/2001 Document Revised: 08/10/2014 Document  Reviewed: 01/20/2014 Elsevier Interactive Patient Education 2016 [employer].    Walking Boot A walking boot (controlled ankle motion boot or CAM walker) is a removable boot-shaped splint that holds your foot or ankle in place after an injury or a medical procedure. This helps with healing and prevents further injury. A walking boot has a stiff, rigid outer frame that limits movement and supports your leg and foot. The inner lining is a layer of padded material. Walking boots usually have several adjustable straps to secure them over the foot. Your health care provider may prescribe a walking boot if it is okay for you to use your injured foot to support your body weight. How much you can walk with the boot on will depend on the type and severity of your injury. Your health care provider will recommend the best boot for you based on your condition. HOW DO I PUT ON MY WALKING BOOT? There are different types of walking boots. Each type of boot has specific instructions about how to wear it properly. Follow instructions from your health care provider about wearing yours. In general:  Sit down to put on your boot. This is more comfortable and it helps to prevent falls.  Open up the boot fully. Place your foot into the boot so that your heel rests against the back.  Your toes should be supported by the base of the boot, but they should not hang over the front.  Adjust the straps so the boot fits securely but is not too tight.  Do not bend the hard frame of the boot to get a good fit.  Ask someone to help you put on the boot, if needed. WHAT ARE SOME TIPS FOR WALKING WITH A WALKING BOOT?  Do not try to walk without wearing the boot unless your health care provider has  approved.  Rest your injured leg as much as possible.  Use other assistive walking devices as told by your health care provider. These include crutches and canes.  On your other foot, wear a shoe with a heel that is close to the height of the boot.  Be very careful when walking on surfaces that are uneven or wet. HOW CAN I REDUCE SWELLING?  Rest your injured foot or leg as much as possible.  If directed, apply ice to the injured area:  Put ice in a plastic bag.  Place a towel between your skin and the bag.  Leave the ice on for 20 minutes, 2-3 times a day for two days or as told by your health care provider.  Keep your injured leg raised (elevated) above the level of your heart for 2-3 hours each day or as told by your health care provider.  If swelling gets worse, loosen the boot and rest and raise your foot.  Contact your health care provider if swelling does not get better or if it gets worse over time. WHAT SKIN CARE PRACTICES SHOULD I FOLLOW?  Wear a long sock to protect your foot and leg from rubbing inside the boot.  Take off the boot one time per day to check the injured area.  Follow instructions from your health care provider about taking care of your incision or wound, if this applies.  Clean and wash the injured area as told by your health care provider.  Gently dry your foot and leg before putting the boot back on.  Contact your health care provider if a wound is getting worse or if your  skin becomes red, painful, or irritated. ARE THERE ANY ACTIVITY RESTRICTIONS? Activity restrictions depend on the type and severity of your injury. Follow instructions from your health care provider.  Bathe and shower as directed by your health care provider.  Do not do activities that could make your injury worse.  Do not drive if your affected foot is one that you usually use for driving. HOW SHOULD I KEEP MY BOOT CLEAN?  Clean the frame and the liner of the boot by  hand. Use a washcloth with mild soap and water.  Do not use chemical cleaning products. These could irritate your skin, especially if you have a wound or an incision.  Do not soak the liner of the boot.  Do not put any part of the boot in a washing machine or a clothes dryer.  Allow the boot to air dry completely before you put it back on your foot.   This information is not intended to replace advice given to you by your health care provider. Make sure you discuss any questions you have with your health care provider.   Document Released: 08/10/2014 Document Reviewed: 08/10/2014 Elsevier Interactive Patient Education [employer]

## 2015-02-01 NOTE — Progress Notes (Signed)
Patient ID: Caren Hazyancy J Utley, female   DOB: 24-Jul-1925, 81 y.o.   MRN: [id number] Subjective: Caren Hazyancy J Dorner is a 81 y.o. female patient who presents to office for evaluation of Right foot pain. Patient states that on Saturday she was getting out of the recliner to go to bed from reading and tripped and inverted her foot; states that she felt the bone crack and called for her husband who helped her up. States that on Sunday morning went to [facility] of which she was given a splint and tramadol for her right foot. Patient denies any pain with sitting; pain only with walking and states that she did not have to take the pain meds. Patient admits to possible neuropathy of which she has always had of unknown etiology. Patient denies any other pedal complaints.   Patient Active Problem List   Diagnosis Date Noted  . Malaise 09/30/2014  . Fatigue 07/18/2014  . Health care maintenance 07/18/2014  . Environmental allergies 03/14/2014  . Cough 12/27/2013  . Abnormal liver function test 12/27/2013  . Light headedness 12/17/2013  . Sinusitis 12/13/2013  . Tongue lesion 08/02/2013  . Back pain 04/09/2013  . Hypertension 01/14/2012  . Hypercholesterolemia 01/14/2012  . Hyponatremia 01/14/2012  . GERD (gastroesophageal reflux disease) 01/14/2012   Current Outpatient Prescriptions on File Prior to Visit  Medication Sig Dispense Refill  . amLODipine (NORVASC) 5 MG tablet TAKE ONE (1) TABLET EACH DAY 30 tablet 10  . amoxicillin (AMOXIL) 875 MG tablet Prior to dental procedures    . aspirin EC 81 MG tablet Take 81 mg by mouth daily.    . calcium carbonate (OS-CAL) 600 MG TABS Take 600 mg by mouth 2 (two) times daily with a meal. Take 1 tablet by mouth twice daily with food    . latanoprost (XALATAN) 0.005 % ophthalmic solution Place 1 drop into both eyes at bedtime.    Marland Kitchen. loratadine (CLARITIN) 10 MG tablet Take 10 mg by mouth daily.    Marland Kitchen. losartan (COZAAR) 50 MG tablet TAKE ONE (1) TABLET BY MOUTH  EVERY DAY 30 tablet 6  . losartan (COZAAR) 50 MG tablet TAKE ONE (1) TABLET EACH DAY 30 tablet 6  . metoprolol succinate (TOPROL XL) 25 MG 24 hr tablet Take 1 tablet (25 mg total) by mouth daily. 30 tablet 5  . naproxen sodium (ANAPROX) 220 MG tablet Take 220 mg by mouth 2 (two) times daily with a meal.    . omeprazole (PRILOSEC) 20 MG capsule Take 20 mg by mouth daily.    . simvastatin (ZOCOR) 20 MG tablet TAKE ONE TABLET BY MOUTH EVERY EVENING. 30 tablet 5  . timolol (BETIMOL) 0.25 % ophthalmic solution Place 1-2 drops into the right eye 2 (two) times daily.     . traMADol (ULTRAM) 50 MG tablet Take 1 tablet (50 mg total) by mouth every 6 (six) hours as needed. 15 tablet 0   No current facility-administered medications on file prior to visit.   Allergies  Allergen Reactions  . Avelox [Moxifloxacin]     Dizziness  . Crestor [Rosuvastatin]   . Pravachol [Pravastatin]   . Vytorin [Ezetimibe-Simvastatin]     Objective:  General: Alert and oriented x3 in no acute distress  Dermatology: No open lesions bilateral lower extremities, no webspace macerations, + ecchymosis right, all nails x 10 are within normal limits.  Vascular: Focal edema noted to right foot and ankle. Dorsalis Pedis and Posterior Tibial pedal pulses 1/4, Capillary Fill Time 4  seconds,(-) pedal hair growth bilateral, Temperature gradient within normal limits.  Neurology: Gross sensation intact via light touch bilateral, Vibratory with tuning fork and Protective sensation significantly diminished with Semmes Weinstein Monofilament to all pedal sites, No babinski sign present bilateral. (- )Tinels sign right foot.   Musculoskeletal: There is tenderness with palpation at lateral malleolus and base of 5th metatarsal on Right foot, no tenderness to medial ankle on right, There is pain present with tuning fork to lateral malleolus and 5th metatarsal base on Right foot, No pain with calf compression bilateral. All joint range of  motion is within normal limits except at right ankle and 5th ray where there is pain and limitation, Strength within normal limits on left and acceptable on right for fractured condition.    Gait: Assisted with rolling walker, mildly antalgic gait  X-rays 01/30/15 [facility])   Right Foot/ankle 3 views    Impression: Acute nondisplaced fractures of the right fifth metatarsal base and the right ankle lateral malleolus.   Assessment and Plan: Problem List Items Addressed This Visit    None    Visit Diagnoses    Right foot pain    -  Primary    Relevant Orders    DG Foot Complete Right    Closed fracture of metatarsal bone, right, initial encounter        Jones fracture    Closed fracture of ankle, right, initial encounter        nondisplaced lateral mal fx and possible medial mal chip fx     Pain of right lower extremity        Ecchymosis        Swelling        localized to right foot and ankle       -Complete examination performed -Xrays reviewed -Discussed treatement options for fracture; risks, alternatives, and benefits explained. Explained will closely monitor fracture healing especially in the areas of the bone that are poorly vascular which may take longer to heal.  -Applied Unna boot to right foot and ankle with instruction to keep clean, dry, and intact for 1 week then remove and apply ACE daily. -Dispensed CAM walker to patient to wear at all times and instructed on use. Ambulate with assistance of rolling walker. -Recommend protection, rest, ice, elevation daily until symptoms improve. -Cont. Tramadol pain med as needed -Patient to return to office in 2 weeks for serial x-rays to assess healing  or sooner if condition worsens.  Asencion Islam, DPM

## 2015-02-15 ENCOUNTER — Ambulatory Visit (INDEPENDENT_AMBULATORY_CARE_PROVIDER_SITE_OTHER): Payer: PPO | Admitting: Sports Medicine

## 2015-02-15 ENCOUNTER — Ambulatory Visit (INDEPENDENT_AMBULATORY_CARE_PROVIDER_SITE_OTHER): Payer: PPO

## 2015-02-15 ENCOUNTER — Encounter: Payer: Self-pay | Admitting: Sports Medicine

## 2015-02-15 DIAGNOSIS — S82891D Other fracture of right lower leg, subsequent encounter for closed fracture with routine healing: Secondary | ICD-10-CM | POA: Diagnosis not present

## 2015-02-15 DIAGNOSIS — R609 Edema, unspecified: Secondary | ICD-10-CM | POA: Diagnosis not present

## 2015-02-15 DIAGNOSIS — M79671 Pain in right foot: Secondary | ICD-10-CM

## 2015-02-15 DIAGNOSIS — R58 Hemorrhage, not elsewhere classified: Secondary | ICD-10-CM

## 2015-02-15 DIAGNOSIS — S92301D Fracture of unspecified metatarsal bone(s), right foot, subsequent encounter for fracture with routine healing: Secondary | ICD-10-CM

## 2015-02-15 NOTE — Progress Notes (Signed)
Patient ID: Janet Moran, female   DOB: Jul 27, 1925, 81 y.o.   MRN: [id number] Subjective: Janet Moran is a 81 y.o. female patient who returns to office for evaluation of Right foot pain and fracture care. Patient states that she has been using boot and icing with no problems. Patient kept unna boot on for 1 week and afterwards applied ACE bandage with no issues. Patient admits to 1 episode of pain of which required pain medication but denies any other problems. Patient denies any other pedal complaints.   Patient Active Problem List   Diagnosis Date Noted  . Malaise 09/30/2014  . Fatigue 07/18/2014  . Health care maintenance 07/18/2014  . Environmental allergies 03/14/2014  . Cough 12/27/2013  . Abnormal liver function test 12/27/2013  . Light headedness 12/17/2013  . Sinusitis 12/13/2013  . Tongue lesion 08/02/2013  . Back pain 04/09/2013  . Hypertension 01/14/2012  . Hypercholesterolemia 01/14/2012  . Hyponatremia 01/14/2012  . GERD (gastroesophageal reflux disease) 01/14/2012   Current Outpatient Prescriptions on File Prior to Visit  Medication Sig Dispense Refill  . amLODipine (NORVASC) 5 MG tablet TAKE ONE (1) TABLET EACH DAY 30 tablet 10  . amoxicillin (AMOXIL) 875 MG tablet Prior to dental procedures    . aspirin EC 81 MG tablet Take 81 mg by mouth daily.    . calcium carbonate (OS-CAL) 600 MG TABS Take 600 mg by mouth 2 (two) times daily with a meal. Take 1 tablet by mouth twice daily with food    . latanoprost (XALATAN) 0.005 % ophthalmic solution Place 1 drop into both eyes at bedtime.    Marland Kitchen loratadine (CLARITIN) 10 MG tablet Take 10 mg by mouth daily.    Marland Kitchen losartan (COZAAR) 50 MG tablet TAKE ONE (1) TABLET BY MOUTH EVERY DAY 30 tablet 6  . losartan (COZAAR) 50 MG tablet TAKE ONE (1) TABLET EACH DAY 30 tablet 6  . metoprolol succinate (TOPROL XL) 25 MG 24 hr tablet Take 1 tablet (25 mg total) by mouth daily. 30 tablet 5  . naproxen sodium (ANAPROX) 220 MG tablet Take 220  mg by mouth 2 (two) times daily with a meal.    . omeprazole (PRILOSEC) 20 MG capsule Take 20 mg by mouth daily.    . simvastatin (ZOCOR) 20 MG tablet TAKE ONE TABLET BY MOUTH EVERY EVENING. 30 tablet 5  . timolol (BETIMOL) 0.25 % ophthalmic solution Place 1-2 drops into the right eye 2 (two) times daily.     . traMADol (ULTRAM) 50 MG tablet Take 1 tablet (50 mg total) by mouth every 6 (six) hours as needed. 15 tablet 0   No current facility-administered medications on file prior to visit.   Allergies  Allergen Reactions  . Avelox [Moxifloxacin]     Dizziness  . Crestor [Rosuvastatin]   . Pravachol [Pravastatin]   . Vytorin [Ezetimibe-Simvastatin]     Objective:  General: Alert and oriented x3 in no acute distress  Dermatology: No open lesions bilateral lower extremities, no webspace macerations, + ecchymosis right foot and ankle improving in nature, all nails x 10 are within normal limits.  Vascular: Focal edema noted to right foot and ankle much improved from prior. Dorsalis Pedis and Posterior Tibial pedal pulses 1/4, Capillary Fill Time 4 seconds,(-) pedal hair growth bilateral, Temperature gradient within normal limits.  Neurology: Gross sensation intact via light touch bilateral, Vibratory with tuning fork and Protective sensation significantly diminished with Semmes Weinstein Monofilament to all pedal sites, No babinski sign present  bilateral. (- )Tinels sign right foot.   Musculoskeletal: There is decreased tenderness with palpation at lateral malleolus and base of 5th metatarsal on Right foot, no tenderness to medial ankle on right, There is pain present with tuning fork to lateral malleolus and 5th metatarsal base on Right foot, No pain with calf compression bilateral. All joint range of motion is within normal limits except at right ankle and 5th ray where there is pain and limitation, Strength within normal limits on left and acceptable on right for fractured condition.     Gait: Assisted with rolling walker, mildly antalgic gait  X-rays 02/15/15   Right Foot/ankle 3 views    Impression: Slight decreased osseous mineralization.  Nondisplaced fractures of the right fifth metatarsal base (jones fracture) and the right ankle lateral malleolus incomplete fracture with mild radiopate debris at medial ankle gutter (chip fracture). Soft tissues within normal limits. No foreign body.    Assessment and Plan: Problem List Items Addressed This Visit    None    Visit Diagnoses    Right foot pain    -  Primary    Relevant Orders    DG Foot Complete Right    Closed fracture of metatarsal bone, right, with routine healing, subsequent encounter        Closed fracture of ankle, right, with routine healing, subsequent encounter        Ecchymosis        Improving    Swelling        Improving       -Complete examination performed -Xrays reviewed -Discussed treatement options for fracture; risks, alternatives, and benefits explained. Explained will closely monitor fracture healing especially in the areas of the bone that are poorly vascular which may take longer to heal. May require bone stim.  -Applied ACE and stockinette; pt to cont with this daily. -Cont with CAM walker to patient to wear at all times and instructed on use. Ambulate with assistance of rolling walker. -Recommend protection, rest, ice, elevation daily until symptoms improve/resolve. -Cont. Tramadol pain med as needed -Patient to return to office in 4 weeks for serial x-rays to assess healing  or sooner if condition worsens.  Asencion Islamitorya Myrissa Chipley, DPM

## 2015-03-15 ENCOUNTER — Ambulatory Visit: Payer: Self-pay

## 2015-03-15 ENCOUNTER — Ambulatory Visit (INDEPENDENT_AMBULATORY_CARE_PROVIDER_SITE_OTHER): Payer: PPO

## 2015-03-15 ENCOUNTER — Ambulatory Visit (INDEPENDENT_AMBULATORY_CARE_PROVIDER_SITE_OTHER): Payer: PPO | Admitting: Sports Medicine

## 2015-03-15 ENCOUNTER — Encounter: Payer: Self-pay | Admitting: Sports Medicine

## 2015-03-15 ENCOUNTER — Other Ambulatory Visit (INDEPENDENT_AMBULATORY_CARE_PROVIDER_SITE_OTHER): Payer: PPO

## 2015-03-15 DIAGNOSIS — R58 Hemorrhage, not elsewhere classified: Secondary | ICD-10-CM

## 2015-03-15 DIAGNOSIS — M79671 Pain in right foot: Secondary | ICD-10-CM | POA: Diagnosis not present

## 2015-03-15 DIAGNOSIS — S82891D Other fracture of right lower leg, subsequent encounter for closed fracture with routine healing: Secondary | ICD-10-CM | POA: Diagnosis not present

## 2015-03-15 DIAGNOSIS — R609 Edema, unspecified: Secondary | ICD-10-CM | POA: Diagnosis not present

## 2015-03-15 DIAGNOSIS — Z23 Encounter for immunization: Secondary | ICD-10-CM

## 2015-03-15 DIAGNOSIS — S92301D Fracture of unspecified metatarsal bone(s), right foot, subsequent encounter for fracture with routine healing: Secondary | ICD-10-CM

## 2015-03-15 DIAGNOSIS — E538 Deficiency of other specified B group vitamins: Secondary | ICD-10-CM

## 2015-03-15 DIAGNOSIS — I10 Essential (primary) hypertension: Secondary | ICD-10-CM | POA: Diagnosis not present

## 2015-03-15 DIAGNOSIS — E7800 Pure hypercholesterolemia, unspecified: Secondary | ICD-10-CM

## 2015-03-15 LAB — LIPID PANEL
CHOL/HDL RATIO: 3
Cholesterol: 188 mg/dL (ref 0–200)
HDL: 56.1 mg/dL (ref >39.00)
LDL CALC: 104 mg/dL — AB (ref 0–99)
NonHDL: 131.42
Triglycerides: 139 mg/dL (ref 0.0–149.0)
VLDL: 27.8 mg/dL (ref 0.0–40.0)

## 2015-03-15 LAB — BASIC METABOLIC PANEL
BUN: 10 mg/dL (ref 6–23)
CHLORIDE: 102 meq/L (ref 96–112)
CO2: 30 mEq/L (ref 19–32)
Calcium: 9.9 mg/dL (ref 8.4–10.5)
Creatinine, Ser: 0.65 mg/dL (ref 0.40–1.20)
GFR: 91.47 mL/min (ref >60.00)
GLUCOSE: 98 mg/dL (ref 70–99)
POTASSIUM: 3.9 meq/L (ref 3.5–5.1)
SODIUM: 139 meq/L (ref 135–145)

## 2015-03-15 LAB — HEPATIC FUNCTION PANEL
ALT: 17 U/L (ref 0–35)
AST: 18 U/L (ref 0–37)
Albumin: 3.9 g/dL (ref 3.5–5.2)
Alkaline Phosphatase: 69 U/L (ref 39–117)
BILIRUBIN DIRECT: 0.1 mg/dL (ref 0.0–0.3)
BILIRUBIN TOTAL: 0.6 mg/dL (ref 0.2–1.2)
TOTAL PROTEIN: 6.8 g/dL (ref 6.0–8.3)

## 2015-03-15 MED ORDER — CYANOCOBALAMIN 1000 MCG/ML IJ SOLN: 1000.0000 ug | Freq: Once | INTRAMUSCULAR | Status: DC

## 2015-03-15 NOTE — Addendum Note (Signed)
Addended by: Acey LavOMAN, Kimara Bencomo M on: 03/15/2015 09:07 AM   Modules accepted: Medications

## 2015-03-15 NOTE — Progress Notes (Addendum)
Patient came in flu shot

## 2015-03-15 NOTE — Progress Notes (Signed)
Patient ID: SHIKITA VAILLANCOURT, female   DOB: 08-02-1925, 81 y.o.   MRN: [id number] Subjective: LATIMA HAMZA is a 81 y.o. female patient who returns to office for evaluation of Right foot pain and fracture care. Patient states that she has been using boot and icing occassionally with no problems. Patient states that swelling is much better and brusing is resolved. Patient states that she is slowly starting to feel a little better and her foot is starting to feel normal again. Patient denies any other pedal complaints.   Patient Active Problem List   Diagnosis Date Noted  . Malaise 09/30/2014  . Fatigue 07/18/2014  . Health care maintenance 07/18/2014  . Environmental allergies 03/14/2014  . Cough 12/27/2013  . Abnormal liver function test 12/27/2013  . Light headedness 12/17/2013  . Sinusitis 12/13/2013  . Tongue lesion 08/02/2013  . Back pain 04/09/2013  . Hypertension 01/14/2012  . Hypercholesterolemia 01/14/2012  . Hyponatremia 01/14/2012  . GERD (gastroesophageal reflux disease) 01/14/2012   Current Outpatient Prescriptions on File Prior to Visit  Medication Sig Dispense Refill  . amLODipine (NORVASC) 5 MG tablet TAKE ONE (1) TABLET EACH DAY 30 tablet 10  . amoxicillin (AMOXIL) 875 MG tablet Prior to dental procedures    . aspirin EC 81 MG tablet Take 81 mg by mouth daily.    . calcium carbonate (OS-CAL) 600 MG TABS Take 600 mg by mouth 2 (two) times daily with a meal. Take 1 tablet by mouth twice daily with food    . latanoprost (XALATAN) 0.005 % ophthalmic solution Place 1 drop into both eyes at bedtime.    Marland Kitchen loratadine (CLARITIN) 10 MG tablet Take 10 mg by mouth daily.    Marland Kitchen losartan (COZAAR) 50 MG tablet TAKE ONE (1) TABLET BY MOUTH EVERY DAY 30 tablet 6  . losartan (COZAAR) 50 MG tablet TAKE ONE (1) TABLET EACH DAY 30 tablet 6  . metoprolol succinate (TOPROL XL) 25 MG 24 hr tablet Take 1 tablet (25 mg total) by mouth daily. 30 tablet 5  . naproxen sodium (ANAPROX) 220 MG tablet  Take 220 mg by mouth 2 (two) times daily with a meal.    . omeprazole (PRILOSEC) 20 MG capsule Take 20 mg by mouth daily.    . simvastatin (ZOCOR) 20 MG tablet TAKE ONE TABLET BY MOUTH EVERY EVENING. 30 tablet 5  . timolol (BETIMOL) 0.25 % ophthalmic solution Place 1-2 drops into the right eye 2 (two) times daily.     . traMADol (ULTRAM) 50 MG tablet Take 1 tablet (50 mg total) by mouth every 6 (six) hours as needed. 15 tablet 0   No current facility-administered medications on file prior to visit.   Allergies  Allergen Reactions  . Avelox [Moxifloxacin]     Dizziness  . Crestor [Rosuvastatin]   . Pravachol [Pravastatin]   . Vytorin [Ezetimibe-Simvastatin]     Objective:  General: Alert and oriented x3 in no acute distress  Dermatology: No open lesions bilateral lower extremities, no webspace macerations,  Ecchymosis resolved to right foot and ankle, all nails x 10 are within normal limits.  Vascular: Decreased focal edema noted to right foot and ankle much improved from prior. Dorsalis Pedis and Posterior Tibial pedal pulses 1/4, Capillary Fill Time 4 seconds,(-) pedal hair growth bilateral, Temperature gradient within normal limits.  Neurology: Gross sensation intact via light touch bilateral, Vibratory with tuning fork and Protective sensation significantly diminished with Semmes Weinstein Monofilament to all pedal sites, No babinski sign present  bilateral. (- )Tinels sign right foot.   Musculoskeletal: There is decreased tenderness with palpation at lateral malleolus and base of 5th metatarsal on Right foot, no tenderness to medial ankle on right, There is pain present with tuning fork to lateral malleolus and 5th metatarsal base on Right foot, No pain with calf compression bilateral. All joint range of motion is within normal limits except at right ankle and 5th ray where there is pain and limitation, Strength within normal limits on left and acceptable on right for fractured  condition.    Gait: Assisted with rolling walker, mildly antalgic gait  X-rays   Right Foot/ankle 3 views    Impression: Slight decreased osseous mineralization.  Nondisplaced fractures of the right fifth metatarsal base (jones fracture) approx 50-60% healed and the right ankle lateral malleolus incomplete fracture with mild radiopate debris at medial ankle gutter (chip fracture). Soft tissues within normal limits. No foreign body.    Assessment and Plan: Problem List Items Addressed This Visit    None    Visit Diagnoses    Right foot pain    -  Primary    Relevant Orders    DG Foot Complete Right    Closed fracture of metatarsal bone, right, with routine healing, subsequent encounter        Slowing healing about 60% healed    Closed fracture of ankle, right, with routine healing, subsequent encounter        Swelling        much improved    Ecchymosis        resloved       -Complete examination performed -Xrays reviewed -Discussed treatement options for fracture; risks, alternatives, and benefits explained. Explained will closely monitor fracture healing especially in the areas of the bone that are poorly vascular which may take longer to heal. May require bone stim. Inquiry placed for office staff to start the insurance approval process for bone stim  -Applied ACE and stockinette; pt to cont with this daily. -Cont with CAM walker to patient to wear at all times and instructed on use. Ambulate with assistance of rolling walker as needed. Patient may return to [employer] job and daily activity to tolerance.  -Recommend protection, rest, ice, elevation daily until symptoms improve/resolve. -Cont. Tramadol pain med as needed -Patient to return to office in 4 weeks for serial x-rays to assess healing  or sooner if condition worsens.  Asencion Islamitorya Kaeden Depaz, DPM

## 2015-03-16 ENCOUNTER — Encounter: Payer: Self-pay | Admitting: Internal Medicine

## 2015-03-16 ENCOUNTER — Telehealth: Payer: Self-pay | Admitting: *Deleted

## 2015-03-16 NOTE — Telephone Encounter (Addendum)
-----  Message from Landis Martins, [place] sent at 03/15/2015  2:11 PM EST ----- Regarding: Bone Stim Hi Valery Can you work on the process of getting bone stim approved for patient? So far patient is 7 weeks since fracture 5th met and ankle fracture which appears to be slowly healing. Thanks  Dr Cannon Kettle Informed Lytle Michaels of the beginning of a new referral pt for the Exogen Bone Growth Stimulator, he states he will pick up the pt clinicals and demo today.

## 2015-03-18 ENCOUNTER — Ambulatory Visit (INDEPENDENT_AMBULATORY_CARE_PROVIDER_SITE_OTHER): Payer: PPO | Admitting: Internal Medicine

## 2015-03-18 ENCOUNTER — Encounter: Payer: Self-pay | Admitting: Internal Medicine

## 2015-03-18 VITALS — BP 110/80 | HR 59 | Temp 97.8°F | Resp 18 | Ht 65.5 in | Wt 150.0 lb

## 2015-03-18 DIAGNOSIS — K219 Gastro-esophageal reflux disease without esophagitis: Secondary | ICD-10-CM | POA: Diagnosis not present

## 2015-03-18 DIAGNOSIS — S92901D Unspecified fracture of right foot, subsequent encounter for fracture with routine healing: Secondary | ICD-10-CM

## 2015-03-18 DIAGNOSIS — Z1239 Encounter for other screening for malignant neoplasm of breast: Secondary | ICD-10-CM

## 2015-03-18 DIAGNOSIS — I10 Essential (primary) hypertension: Secondary | ICD-10-CM | POA: Diagnosis not present

## 2015-03-18 DIAGNOSIS — E871 Hypo-osmolality and hyponatremia: Secondary | ICD-10-CM

## 2015-03-18 DIAGNOSIS — E7800 Pure hypercholesterolemia, unspecified: Secondary | ICD-10-CM

## 2015-03-18 DIAGNOSIS — Z9109 Other allergy status, other than to drugs and biological substances: Secondary | ICD-10-CM

## 2015-03-18 DIAGNOSIS — Z91048 Other nonmedicinal substance allergy status: Secondary | ICD-10-CM

## 2015-03-18 DIAGNOSIS — R945 Abnormal results of liver function studies: Secondary | ICD-10-CM

## 2015-03-18 DIAGNOSIS — R7989 Other specified abnormal findings of blood chemistry: Secondary | ICD-10-CM

## 2015-03-18 MED ORDER — SIMVASTATIN 20 MG PO TABS: ORAL_TABLET | ORAL | Status: DC

## 2015-03-18 MED ORDER — METOPROLOL SUCCINATE ER 25 MG PO TB24: 25.0000 mg | ORAL_TABLET | Freq: Every day | ORAL | Status: DC

## 2015-03-18 NOTE — Progress Notes (Signed)
Patient ID: Janet Moran, female   DOB: 31-Jan-1926, 81 y.o.   MRN: [id number]   Subjective:    Patient ID: Janet Hazyancy J Bidwell, female    DOB: 31-Jan-1926, 81 y.o.   MRN: [id number]  HPI  Patient with past history of hypercholesterolemia, GERD, allergies and hypertension.  She comes in today to follow up on these issues.  She recently fractured her foot.  Is seeing podiatry.  Wearing a boot.  Discussed bone density.  Did not get scheduled last visit.  Will schedule.  Allergies overall controlled.  No chest pain or tightness reported.  No sob.  No abdominal pain or cramping.  Bowels stable.  No pain in the foot.     Past Medical History  Diagnosis Date  . Arthritis   . Chicken pox   . GERD (gastroesophageal reflux disease)   . Glaucoma   . Hypertension   . Hypercholesterolemia    Past Surgical History  Procedure Laterality Date  . Tonsillectomy and adenoidectomy  1935  . Abdominal hysterectomy  1976   Family History  Problem Relation Age of Onset  . Stroke Mother   . Stroke Father   . Diabetes Maternal Grandmother   . Diabetes Maternal Grandfather   . Lung cancer Sister     died [id number]   Social History   Social History  . Marital Status: Married    Spouse Name: N/A  . Number of Children: N/A  . Years of Education: N/A   Social History Main Topics  . Smoking status: Never Smoker   . Smokeless tobacco: Never Used  . Alcohol Use: No  . Drug Use: No  . Sexual Activity: Not Asked   Other Topics Concern  . None   Social History Narrative    Outpatient Encounter Prescriptions as of 03/18/2015  Medication Sig  . amLODipine (NORVASC) 5 MG tablet TAKE ONE (1) TABLET EACH DAY  . amoxicillin (AMOXIL) 875 MG tablet Prior to dental procedures  . aspirin EC 81 MG tablet Take 81 mg by mouth daily.  . calcium carbonate (OS-CAL) 600 MG TABS Take 600 mg by mouth 2 (two) times daily with a meal. Take 1 tablet by mouth twice daily with food  . latanoprost (XALATAN) 0.005 % ophthalmic  solution Place 1 drop into both eyes at bedtime.  Marland Kitchen. losartan (COZAAR) 50 MG tablet TAKE ONE (1) TABLET BY MOUTH EVERY DAY  . metoprolol succinate (TOPROL XL) 25 MG 24 hr tablet Take 1 tablet (25 mg total) by mouth daily.  . naproxen sodium (ANAPROX) 220 MG tablet Take 220 mg by mouth 2 (two) times daily with a meal.  . omeprazole (PRILOSEC) 20 MG capsule Take 20 mg by mouth daily.  . simvastatin (ZOCOR) 20 MG tablet TAKE ONE TABLET BY MOUTH EVERY EVENING.  Marland Kitchen. timolol (BETIMOL) 0.25 % ophthalmic solution Place 1-2 drops into the right eye 2 (two) times daily.   . [DISCONTINUED] loratadine (CLARITIN) 10 MG tablet Take 10 mg by mouth daily.  . [DISCONTINUED] losartan (COZAAR) 50 MG tablet TAKE ONE (1) TABLET EACH DAY  . [DISCONTINUED] metoprolol succinate (TOPROL XL) 25 MG 24 hr tablet Take 1 tablet (25 mg total) by mouth daily.  . [DISCONTINUED] simvastatin (ZOCOR) 20 MG tablet TAKE ONE TABLET BY MOUTH EVERY EVENING.  . [DISCONTINUED] traMADol (ULTRAM) 50 MG tablet Take 1 tablet (50 mg total) by mouth every 6 (six) hours as needed.   Facility-Administered Encounter Medications as of 03/18/2015  Medication  . cyanocobalamin ((VITAMIN B-12))  injection 1,000 mcg    Review of Systems  Constitutional: Negative for appetite change.       Weight is down some from the last check.    HENT: Negative for congestion and sinus pressure.        Allergies under reasonable control.   Respiratory: Negative for cough, chest tightness and shortness of breath.   Cardiovascular: Negative for chest pain, palpitations and leg swelling.  Gastrointestinal: Negative for nausea, vomiting, abdominal pain and diarrhea.  Musculoskeletal: Negative for joint swelling.       No back pain reported today.  S/p foot fractyre  Skin: Negative for color change and rash.  Neurological: Negative for dizziness, light-headedness and headaches.  Psychiatric/Behavioral: Negative for dysphoric mood and agitation.       Objective:       Blood pressure rechecked by me:  138/72  Physical Exam  Constitutional: She appears well-developed and well-nourished. No distress.  HENT:  Nose: Nose normal.  Mouth/Throat: Oropharynx is clear and moist.  Eyes: Conjunctivae are normal. Right eye exhibits no discharge. Left eye exhibits no discharge.  Neck: Neck supple. No thyromegaly present.  Cardiovascular: Normal rate and regular rhythm.   Pulmonary/Chest: Breath sounds normal. No respiratory distress. She has no wheezes.  Abdominal: Soft. Bowel sounds are normal. There is no tenderness.  Musculoskeletal: She exhibits no edema.  Boot in place right foot.   Lymphadenopathy:    She has no cervical adenopathy.  Skin: No rash noted. No erythema.  Psychiatric: She has a normal mood and affect. Her behavior is normal.    BP 110/80 mmHg  Pulse 59  Temp(Src) 97.8 F (36.6 C) (Oral)  Resp 18  Ht 5' 5.5" (1.664 m)  Wt 150 lb (68.04 kg)  BMI 24.57 kg/m2  SpO2 97% Wt Readings from Last 3 Encounters:  03/18/15 150 lb (68.04 kg)  01/30/15 145 lb (65.772 kg)  11/16/14 150 lb 2 oz (68.096 kg)     Lab Results  Component Value Date   WBC 7.4 09/30/2014   HGB 14.3 09/30/2014   HCT 42.3 09/30/2014   PLT 208.0 09/30/2014   GLUCOSE 98 03/15/2015   CHOL 188 03/15/2015   TRIG 139.0 03/15/2015   HDL 56.10 03/15/2015   LDLCALC 104* 03/15/2015   ALT 17 03/15/2015   AST 18 03/15/2015   NA 139 03/15/2015   K 3.9 03/15/2015   CL 102 03/15/2015   CREATININE 0.65 03/15/2015   BUN 10 03/15/2015   CO2 30 03/15/2015   TSH 3.29 09/30/2014    Dg Ankle Complete Right  01/30/2015  CLINICAL DATA:  Injury 2 right foot when rising from sitting to standing. Bruising over ankle. EXAM: RIGHT ANKLE - COMPLETE 3+ VIEW COMPARISON:  None. FINDINGS: Examination demonstrates a subtle nondisplaced fracture over the tip of the fibula cannot exclude a small chip fracture adjacent the medial malleolus. Ankle mortise is normal. Mild soft tissue  swelling over the lateral ankle. Displaced transverse fracture of the base of the fifth metatarsal. IMPRESSION: Nondisplaced transverse fracture over the distal tip of the fibula. Displaced transverse fracture of the base of the fifth metatarsal. Possible chip fracture over the medial malleolus. Electronically Signed   By: Elberta Fortis M.D.   On: 01/30/2015 08:58   Dg Foot Complete Right  01/30/2015  CLINICAL DATA:  Right foot injury, acute pain and bruising. EXAM: RIGHT FOOT COMPLETE - 3+ VIEW COMPARISON:  None. FINDINGS: There is an acute nondisplaced fracture of the right fifth metatarsal base. Bones  are osteopenic. Normal alignment. Right first MTP joint osteoarthritis noted with associated bunion and hallux valgus deformity. On the lateral view, there is also a nondisplaced acute fracture of the right ankle lateral malleolus. IMPRESSION: Acute nondisplaced fractures of the right fifth metatarsal base and the right ankle lateral malleolus. Electronically Signed   By: Judie Petit.  Shick M.D.   On: 01/30/2015 08:58       Assessment & Plan:   Problem List Items Addressed This Visit    Abnormal liver function test    Last liver panel wnl.        Environmental allergies    Controlled.  Follow.       Foot fracture, right    Recent foot fracture.  Seeing podiatry.  In a boot.  Follow.  Schedule bone density.       GERD (gastroesophageal reflux disease)    Symptoms controlled on omeprazole.  Follow.        Hypercholesterolemia    Low cholesterol diet and exercise.  On lovastatin.  Follow lipid panel and liver function tests.        Relevant Medications   simvastatin (ZOCOR) 20 MG tablet   metoprolol succinate (TOPROL XL) 25 MG 24 hr tablet   Other Relevant Orders   Hepatic function panel   Lipid panel   Hypertension    Blood pressure under good control.  Continue same medication regimen.  Follow pressures.  Follow metabolic panel.        Relevant Medications   simvastatin (ZOCOR) 20 MG  tablet   metoprolol succinate (TOPROL XL) 25 MG 24 hr tablet   Other Relevant Orders   Basic metabolic panel   Hyponatremia    Recent sodium check wnl.         Other Visit Diagnoses    Breast cancer screening    -  Primary    Relevant Orders    MM DIGITAL SCREENING BILATERAL        Dale [place], MD

## 2015-03-18 NOTE — Progress Notes (Signed)
Pre-visit discussion using our clinic review tool. No additional management support is needed unless otherwise documented below in the visit note.  

## 2015-03-20 ENCOUNTER — Encounter: Payer: Self-pay | Admitting: Internal Medicine

## 2015-03-20 DIAGNOSIS — S92901A Unspecified fracture of right foot, initial encounter for closed fracture: Secondary | ICD-10-CM | POA: Insufficient documentation

## 2015-03-20 NOTE — Assessment & Plan Note (Signed)
Controlled.  Follow.   

## 2015-03-20 NOTE — Assessment & Plan Note (Addendum)
Recent foot fracture.  Seeing podiatry.  In a boot.  Follow.  Schedule bone density.

## 2015-03-20 NOTE — Assessment & Plan Note (Signed)
Symptoms controlled on omeprazole.  Follow.  

## 2015-03-20 NOTE — Assessment & Plan Note (Signed)
Recent sodium check wnl.  

## 2015-03-20 NOTE — Assessment & Plan Note (Signed)
Low cholesterol diet and exercise.  On lovastatin.  Follow lipid panel and liver function tests.   

## 2015-03-20 NOTE — Assessment & Plan Note (Signed)
Blood pressure under good control.  Continue same medication regimen.  Follow pressures.  Follow metabolic panel.   

## 2015-03-20 NOTE — Assessment & Plan Note (Signed)
Last liver panel wnl.  

## 2015-04-12 ENCOUNTER — Ambulatory Visit (INDEPENDENT_AMBULATORY_CARE_PROVIDER_SITE_OTHER): Payer: PPO | Admitting: Sports Medicine

## 2015-04-12 ENCOUNTER — Encounter: Payer: Self-pay | Admitting: Sports Medicine

## 2015-04-12 ENCOUNTER — Ambulatory Visit (INDEPENDENT_AMBULATORY_CARE_PROVIDER_SITE_OTHER): Payer: PPO

## 2015-04-12 DIAGNOSIS — M79672 Pain in left foot: Secondary | ICD-10-CM

## 2015-04-12 DIAGNOSIS — B351 Tinea unguium: Secondary | ICD-10-CM

## 2015-04-12 DIAGNOSIS — M79671 Pain in right foot: Secondary | ICD-10-CM

## 2015-04-12 DIAGNOSIS — G629 Polyneuropathy, unspecified: Secondary | ICD-10-CM

## 2015-04-12 DIAGNOSIS — M79673 Pain in unspecified foot: Secondary | ICD-10-CM

## 2015-04-12 DIAGNOSIS — S92301D Fracture of unspecified metatarsal bone(s), right foot, subsequent encounter for fracture with routine healing: Secondary | ICD-10-CM | POA: Diagnosis not present

## 2015-04-12 DIAGNOSIS — R609 Edema, unspecified: Secondary | ICD-10-CM

## 2015-04-12 DIAGNOSIS — S82891D Other fracture of right lower leg, subsequent encounter for closed fracture with routine healing: Secondary | ICD-10-CM | POA: Diagnosis not present

## 2015-04-12 NOTE — Progress Notes (Signed)
Patient ID: Janet Hazyancy J Drewes, female   DOB: [birth year], 82 y.o.   MRN: [id number]  Subjective: Janet Moran is a 82 y.o. female patient who returns to office for evaluation of Right foot pain and fracture care; 10.5 weeks since injury. Patient states that she has been using boot and icing occassionally with no problems. 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 Patient states that she is slowly starting to feel a little better however patient states that she is getting a little tired of the boot. Denies calf, chest pain, SOB or any other symptoms. Patient denies any other pedal complaints.   Patient Active Problem List   Diagnosis Date Noted  . Foot fracture, right 03/20/2015  . Malaise 09/30/2014  . Fatigue 07/18/2014  . Health care maintenance 07/18/2014  . Environmental allergies 03/14/2014  . Cough 12/27/2013  . Abnormal liver function test 12/27/2013  . Light headedness 12/17/2013  . Sinusitis 12/13/2013  . Tongue lesion 08/02/2013  . Back pain 04/09/2013  . Hypertension 01/14/2012  . Hypercholesterolemia 01/14/2012  . Hyponatremia 01/14/2012  . GERD (gastroesophageal reflux disease) 01/14/2012   Current Outpatient Prescriptions on File Prior to Visit  Medication Sig Dispense Refill  . amLODipine (NORVASC) 5 MG tablet TAKE ONE (1) TABLET EACH DAY 30 tablet 10  . amoxicillin (AMOXIL) 875 MG tablet Prior to dental procedures    . aspirin EC 81 MG tablet Take 81 mg by mouth daily.    . calcium carbonate (OS-CAL) 600 MG TABS Take 600 mg by mouth 2 (two) times daily with a meal. Take 1 tablet by mouth twice daily with food    . latanoprost (XALATAN) 0.005 % ophthalmic solution Place 1 drop into both eyes at bedtime.    Marland Kitchen. losartan (COZAAR) 50 MG tablet TAKE ONE (1) TABLET BY MOUTH EVERY DAY 30 tablet 6  . metoprolol succinate (TOPROL XL) 25 MG 24 hr tablet Take 1 tablet (25 mg total) by mouth daily. 30 tablet 11  . naproxen sodium (ANAPROX) 220 MG tablet Take 220 mg by  mouth 2 (two) times daily with a meal.    . omeprazole (PRILOSEC) 20 MG capsule Take 20 mg by mouth daily.    . simvastatin (ZOCOR) 20 MG tablet TAKE ONE TABLET BY MOUTH EVERY EVENING. 30 tablet 11  . timolol (BETIMOL) 0.25 % ophthalmic solution Place 1-2 drops into the right eye 2 (two) times daily.      Current Facility-Administered Medications on File Prior to Visit  Medication Dose Route Frequency Provider Last Rate Last Dose  . cyanocobalamin ((VITAMIN B-12)) injection 1,000 mcg  1,000 mcg Intramuscular Once Dale Durhamharlene Scott, MD   1,000 mcg at 03/15/15 [id number]   Allergies  Allergen Reactions  . Avelox [Moxifloxacin]     Dizziness  . Crestor [Rosuvastatin]   . Pravachol [Pravastatin]   . Vytorin [Ezetimibe-Simvastatin]     Objective:  General: Alert and oriented x3 in no acute distress  Dermatology: No open lesions bilateral lower extremities, no webspace macerations,  No ecchymosis to right foot and ankle, all nails x 10 are mildly elongated with mild thickening with subungal debris; desiring trim today.  Vascular: Decreased focal edema noted to right foot and ankle much improved from prior. Dorsalis Pedis and Posterior Tibial pedal pulses 1/4, Capillary Fill Time 4 seconds,(-) pedal hair growth bilateral, Temperature gradient within normal limits.  Neurology: Gross sensation intact via light touch bilateral, Vibratory with tuning fork and Protective sensation significantly diminished with Phoebe PerchSemmes Weinstein Monofilament  to all pedal sites, No babinski sign present bilateral. (- )Tinels sign right foot.   Musculoskeletal: There is decreased tenderness with palpation at lateral malleolus and base of 5th metatarsal on Right foot, no tenderness to medial ankle on right, There is decreased pain present with tuning fork to lateral malleolus and 5th metatarsal base on Right foot, No pain with calf compression bilateral. All joint range of motion is within normal limits except at right ankle and  5th ray where there is mild guarding, Asymptomatic bunion and hammertoe deformities bilateral, Strength within normal limits on left and acceptable on right for fractured condition.    X-rays   Right Foot/ankle:    Impression: No acute changes from 03-15-15; Slight decreased osseous mineralization.  Nondisplaced fractures of the right fifth metatarsal base (jones fracture) approx 50-60% healed and the right ankle lateral malleolus incomplete fracture with mild radiopate debris at medial ankle gutter (chip fracture). Soft tissues within normal limits. No foreign body.   Assessment and Plan: Problem List Items Addressed This Visit    None    Visit Diagnoses    Right foot pain    -  Primary    Relevant Orders    DG Foot 2 Views Right    Closed fracture of metatarsal bone, right, with routine healing, subsequent encounter        10.5 weeks since injury     Closed fracture of ankle, right, with routine healing, subsequent encounter        10.5 weeks since injury     Swelling        Neuropathy (HCC)        Dermatophytosis of nail        Left foot pain          -Complete examination performed -Mechanically debrided nails x 10 using sterile nail nipper and smoothed rough edges using dremel without incident -Gave patient silicone pad to apply over bunions to offer protection  -Xrays reviewed -Discussed treatement options for fracture; risks, alternatives, and benefits explained. Explained will closely monitor fracture healing especially in the areas of the bone that are poorly vascular which may take longer to heal.  -Patient will be elligible for bone stim after 04-30-15 -Applied surgitube stockinette; pt to cont with this daily to assist with edema control. -Cont with CAM walker to patient to wear at all times and instructed on use. Ambulate with assistance of rolling walker as needed. Cont with normal activitites to tolerance.  -Recommend protection, rest, ice, elevation daily until  symptoms improve/resolve. -Cont. Tramadol pain med as needed -Patient to return to office in 4 weeks for serial x-rays to assess healing  or sooner if condition worsens.  Asencion Islam, DPM

## 2015-04-18 ENCOUNTER — Ambulatory Visit: Payer: PPO | Admitting: Podiatry

## 2015-04-25 ENCOUNTER — Ambulatory Visit: Payer: Self-pay

## 2015-05-03 ENCOUNTER — Ambulatory Visit
Admission: RE | Admit: 2015-05-03 | Discharge: 2015-05-03 | Disposition: A | Discharge status: Home / Self Care | Payer: PPO | Source: Ambulatory Visit | Attending: Internal Medicine | Admitting: Internal Medicine

## 2015-05-03 DIAGNOSIS — Z13820 Encounter for screening for osteoporosis: Secondary | ICD-10-CM | POA: Insufficient documentation

## 2015-05-03 DIAGNOSIS — Z1231 Encounter for screening mammogram for malignant neoplasm of breast: Secondary | ICD-10-CM | POA: Insufficient documentation

## 2015-05-03 DIAGNOSIS — M810 Age-related osteoporosis without current pathological fracture: Secondary | ICD-10-CM | POA: Insufficient documentation

## 2015-05-03 DIAGNOSIS — Z1239 Encounter for other screening for malignant neoplasm of breast: Secondary | ICD-10-CM

## 2015-05-03 DIAGNOSIS — E2839 Other primary ovarian failure: Secondary | ICD-10-CM

## 2015-05-04 ENCOUNTER — Encounter: Payer: Self-pay | Admitting: *Deleted

## 2015-05-10 ENCOUNTER — Ambulatory Visit (INDEPENDENT_AMBULATORY_CARE_PROVIDER_SITE_OTHER): Payer: PPO

## 2015-05-10 ENCOUNTER — Encounter: Payer: Self-pay | Admitting: Sports Medicine

## 2015-05-10 ENCOUNTER — Ambulatory Visit (INDEPENDENT_AMBULATORY_CARE_PROVIDER_SITE_OTHER): Payer: PPO | Admitting: Sports Medicine

## 2015-05-10 DIAGNOSIS — S92301K Fracture of unspecified metatarsal bone(s), right foot, subsequent encounter for fracture with nonunion: Secondary | ICD-10-CM | POA: Diagnosis not present

## 2015-05-10 DIAGNOSIS — M79671 Pain in right foot: Secondary | ICD-10-CM | POA: Diagnosis not present

## 2015-05-10 DIAGNOSIS — S82891D Other fracture of right lower leg, subsequent encounter for closed fracture with routine healing: Secondary | ICD-10-CM

## 2015-05-10 DIAGNOSIS — G629 Polyneuropathy, unspecified: Secondary | ICD-10-CM | POA: Diagnosis not present

## 2015-05-10 DIAGNOSIS — Z1239 Encounter for other screening for malignant neoplasm of breast: Secondary | ICD-10-CM

## 2015-05-10 NOTE — Progress Notes (Signed)
Patient ID: Janet Moran, female   DOB: 1925/06/10, 82 y.o.   MRN: [id number]  Subjective: Janet Moran is a 82 y.o. female patient who returns to office for evaluation of Right foot pain and fracture care; 14.5 weeks since injury. Patient states that she has little to no pain. States that she has not done much activity since fracture and feels very tired with use of boot. Denies calf, chest pain, SOB or any other symptoms. Patient denies any other pedal complaints.   Patient Active Problem List   Diagnosis Date Noted  . Foot fracture, right 03/20/2015  . Malaise 09/30/2014  . Fatigue 07/18/2014  . Health care maintenance 07/18/2014  . Environmental allergies 03/14/2014  . Cough 12/27/2013  . Abnormal liver function test 12/27/2013  . Light headedness 12/17/2013  . Sinusitis 12/13/2013  . Tongue lesion 08/02/2013  . Back pain 04/09/2013  . Hypertension 01/14/2012  . Hypercholesterolemia 01/14/2012  . Hyponatremia 01/14/2012  . GERD (gastroesophageal reflux disease) 01/14/2012   Current Outpatient Prescriptions on File Prior to Visit  Medication Sig Dispense Refill  . amLODipine (NORVASC) 5 MG tablet TAKE ONE (1) TABLET EACH DAY 30 tablet 10  . amoxicillin (AMOXIL) 875 MG tablet Prior to dental procedures    . aspirin EC 81 MG tablet Take 81 mg by mouth daily.    . calcium carbonate (OS-CAL) 600 MG TABS Take 600 mg by mouth 2 (two) times daily with a meal. Take 1 tablet by mouth twice daily with food    . latanoprost (XALATAN) 0.005 % ophthalmic solution Place 1 drop into both eyes at bedtime.    Marland Kitchen losartan (COZAAR) 50 MG tablet TAKE ONE (1) TABLET BY MOUTH EVERY DAY 30 tablet 6  . metoprolol succinate (TOPROL XL) 25 MG 24 hr tablet Take 1 tablet (25 mg total) by mouth daily. 30 tablet 11  . naproxen sodium (ANAPROX) 220 MG tablet Take 220 mg by mouth 2 (two) times daily with a meal.    . omeprazole (PRILOSEC) 20 MG capsule Take 20 mg by mouth daily.    . simvastatin (ZOCOR) 20  MG tablet TAKE ONE TABLET BY MOUTH EVERY EVENING. 30 tablet 11  . timolol (BETIMOL) 0.25 % ophthalmic solution Place 1-2 drops into the right eye 2 (two) times daily.      Current Facility-Administered Medications on File Prior to Visit  Medication Dose Route Frequency Provider Last Rate Last Dose  . cyanocobalamin ((VITAMIN B-12)) injection 1,000 mcg  1,000 mcg Intramuscular Once Dale [place], MD   1,000 mcg at 03/15/15 [id number]   Allergies  Allergen Reactions  . Avelox [Moxifloxacin]     Dizziness  . Crestor [Rosuvastatin]   . Pravachol [Pravastatin]   . Vytorin [Ezetimibe-Simvastatin]     Objective:  General: Alert and oriented x3 in no acute distress  Dermatology: No open lesions bilateral lower extremities, no webspace macerations,  No ecchymosis to right foot and ankle, all nails x 10 are short and mildly thickening with subungal debris, asymptomatic at this encounter.   Vascular: Decreased focal edema noted to right foot and ankle much improved from prior. Dorsalis Pedis and Posterior Tibial pedal pulses 1/4, Capillary Fill Time 4 seconds,(-) pedal hair growth bilateral, Temperature gradient within normal limits.  Neurology: Gross sensation intact via light touch bilateral, Vibratory with tuning fork and Protective sensation significantly diminished with Semmes Weinstein Monofilament to all pedal sites unchanged from prior, No babinski sign present bilateral. (- )Tinels sign right foot.   Musculoskeletal: There  is decreased tenderness with palpation at lateral malleolus and base of 5th metatarsal on Right foot, no tenderness to medial ankle on right, There is decreased pain present with tuning fork to lateral malleolus and 5th metatarsal base on Right foot, No pain with calf compression bilateral. All joint range of motion is within normal limits except at right ankle and 5th ray where there is mild guarding, Asymptomatic bunion and hammertoe deformities bilateral, Strength within  normal limits on left and acceptable on right for fractured condition.    X-rays   Right Foot/ankle:    Impression: Decreased osseous mineralization.  Nondisplaced fracture of the right fifth metatarsal base (jones fracture) with visible fracture line not consolidated and chip fracture at right ankle lateral malleolus incomplete fracture with mild radiopate debris at medial ankle gutter that is improving in nature. Soft tissues within normal limits. No foreign body.   Assessment and Plan: Problem List Items Addressed This Visit    None    Visit Diagnoses    Closed fracture of ankle, right, with routine healing, subsequent encounter    -  Primary    Right foot pain        Relevant Orders    DG Foot Complete Right    Closed fracture of metatarsal bone, right, with nonunion, subsequent encounter        Neuropathy (HCC)          -Complete examination performed -Xrays reviewed -Discussed treatement options for fracture; risks, alternatives, and benefits explained.  -Exogen bone stimulation started to use at 5th metatarsal fracture site on right; site marked and patient educated on use of device with vendor present -Cont with surgi-tube stockinet to assist with edema control. -Cont with CAM walker to patient to wear at all times and instructed on use. Ambulate with assistance of rolling walker as needed. Cont with normal activitites to tolerance.  -Recommend protection, rest, ice, elevation daily until symptoms improve/resolve. -Cont. Tramadol pain med as needed -Patient to return to office in 3 weeks for serial x-rays to assess healing  or sooner if condition worsens.  Asencion Islam, DPM

## 2015-05-27 ENCOUNTER — Encounter: Payer: Self-pay | Admitting: Sports Medicine

## 2015-05-27 ENCOUNTER — Ambulatory Visit (INDEPENDENT_AMBULATORY_CARE_PROVIDER_SITE_OTHER): Payer: PPO

## 2015-05-27 ENCOUNTER — Ambulatory Visit (INDEPENDENT_AMBULATORY_CARE_PROVIDER_SITE_OTHER): Payer: PPO | Admitting: Sports Medicine

## 2015-05-27 DIAGNOSIS — S82891D Other fracture of right lower leg, subsequent encounter for closed fracture with routine healing: Secondary | ICD-10-CM

## 2015-05-27 DIAGNOSIS — M79671 Pain in right foot: Secondary | ICD-10-CM

## 2015-05-27 DIAGNOSIS — G629 Polyneuropathy, unspecified: Secondary | ICD-10-CM

## 2015-05-27 DIAGNOSIS — S92301K Fracture of unspecified metatarsal bone(s), right foot, subsequent encounter for fracture with nonunion: Secondary | ICD-10-CM

## 2015-05-27 NOTE — Progress Notes (Signed)
Patient ID: MADELINE PHO, female   DOB: [birth year], 82 y.o.   MRN: [id number]  Subjective: Janet Moran is a 82 y.o. female patient who returns to office for evaluation of Right foot pain and fracture care; 17.5 weeks since injury. Patient states that she has little to no pain. Denies calf, chest pain, SOB or any other symptoms. Patient denies any other pedal complaints.   Patient Active Problem List   Diagnosis Date Noted  . Foot fracture, right 03/20/2015  . Malaise 09/30/2014  . Fatigue 07/18/2014  . Health care maintenance 07/18/2014  . Environmental allergies 03/14/2014  . Cough 12/27/2013  . Abnormal liver function test 12/27/2013  . Light headedness 12/17/2013  . Sinusitis 12/13/2013  . Tongue lesion 08/02/2013  . Back pain 04/09/2013  . Hypertension 01/14/2012  . Hypercholesterolemia 01/14/2012  . Hyponatremia 01/14/2012  . GERD (gastroesophageal reflux disease) 01/14/2012   Current Outpatient Prescriptions on File Prior to Visit  Medication Sig Dispense Refill  . amLODipine (NORVASC) 5 MG tablet TAKE ONE (1) TABLET EACH DAY 30 tablet 10  . amoxicillin (AMOXIL) 875 MG tablet Prior to dental procedures    . aspirin EC 81 MG tablet Take 81 mg by mouth daily.    . calcium carbonate (OS-CAL) 600 MG TABS Take 600 mg by mouth 2 (two) times daily with a meal. Take 1 tablet by mouth twice daily with food    . latanoprost (XALATAN) 0.005 % ophthalmic solution Place 1 drop into both eyes at bedtime.    Marland Kitchen losartan (COZAAR) 50 MG tablet TAKE ONE (1) TABLET BY MOUTH EVERY DAY 30 tablet 6  . metoprolol succinate (TOPROL XL) 25 MG 24 hr tablet Take 1 tablet (25 mg total) by mouth daily. 30 tablet 11  . naproxen sodium (ANAPROX) 220 MG tablet Take 220 mg by mouth 2 (two) times daily with a meal.    . omeprazole (PRILOSEC) 20 MG capsule Take 20 mg by mouth daily.    . simvastatin (ZOCOR) 20 MG tablet TAKE ONE TABLET BY MOUTH EVERY EVENING. 30 tablet 11  . timolol (BETIMOL) 0.25 %  ophthalmic solution Place 1-2 drops into the right eye 2 (two) times daily.      Current Facility-Administered Medications on File Prior to Visit  Medication Dose Route Frequency Provider Last Rate Last Dose  . cyanocobalamin ((VITAMIN B-12)) injection 1,000 mcg  1,000 mcg Intramuscular Once Dale [place], MD   1,000 mcg at 03/15/15 1478   Allergies  Allergen Reactions  . Avelox [Moxifloxacin]     Dizziness  . Crestor [Rosuvastatin]   . Pravachol [Pravastatin]   . Vytorin [Ezetimibe-Simvastatin]     Objective:  General: Alert and oriented x3 in no acute distress  Dermatology: No open lesions bilateral lower extremities, no webspace macerations,  No ecchymosis to right foot and ankle, all nails x 10 are short and mildly thickening with subungal debris, asymptomatic at this encounter.   Vascular: No focal edema noted to right foot and ankle. Dorsalis Pedis and Posterior Tibial pedal pulses 1/4, Capillary Fill Time 4 seconds,(-) pedal hair growth bilateral, Temperature gradient within normal limits.  Neurology: Gross sensation intact via light touch bilateral, Vibratory with tuning fork and Protective sensation significantly diminished with Semmes Weinstein Monofilament to all pedal sites unchanged from prior, No babinski sign present bilateral. (- )Tinels sign right foot.    Musculoskeletal: There is no tenderness with palpation at lateral malleolus and base of 5th metatarsal on Right foot, no tenderness to medial ankle  on right, There is decreased pain present with tuning fork to lateral malleolus and 5th metatarsal base on Right foot, No pain with calf compression bilateral. All joint range of motion is within normal limits except at right ankle and 5th ray where there is mild guarding, Asymptomatic bunion and hammertoe deformities bilateral, Strength within normal limits on left and acceptable on right for fractured condition.    X-rays   Right Foot/ankle:    Impression: Decreased  osseous mineralization.  Nondisplaced fracture of the right fifth metatarsal base (jones fracture) with visible fracture line that appears to be consolidating and chip fracture at right ankle lateral malleolus incomplete fracture with mild radiopate debris at medial ankle gutter that is improving in nature. Soft tissues within normal limits. No foreign body.   Assessment and Plan: Problem List Items Addressed This Visit    None    Visit Diagnoses    Right foot pain    -  Primary    Relevant Orders    DG Foot Complete Right    Closed fracture of ankle, right, with routine healing, subsequent encounter        Closed fracture of metatarsal bone, right, with nonunion, subsequent encounter        Neuropathy (HCC)          -Complete examination performed -Xrays reviewed -Discussed treatement options for fracture; risks, alternatives, and benefits explained.  -Cont with Exogen bone stimulation  -Cont with surgi-tube stockinet to assist with edema control. -Cont with CAM walker to patient to wear at all times and instructed on use. Ambulate with assistance of rolling walker as needed. Cont with normal activitites to tolerance.  -Recommend protection, rest, ice, elevation daily until symptoms improve/resolve. -Cont. Tramadol pain med as needed -Patient to return to office in 3 weeks for serial x-rays to assess healing  or sooner if condition worsens. May consider d/c of bone stimulator and transition to normal shoe at next visit and PT.  Asencion Islam, DPM

## 2015-05-30 DIAGNOSIS — S92301K Fracture of unspecified metatarsal bone(s), right foot, subsequent encounter for fracture with nonunion: Secondary | ICD-10-CM | POA: Diagnosis not present

## 2015-05-31 ENCOUNTER — Encounter: Payer: Self-pay | Admitting: Internal Medicine

## 2015-05-31 ENCOUNTER — Ambulatory Visit (INDEPENDENT_AMBULATORY_CARE_PROVIDER_SITE_OTHER): Payer: PPO | Admitting: Internal Medicine

## 2015-05-31 VITALS — BP 120/70 | HR 58 | Temp 97.9°F | Resp 18 | Ht 65.5 in | Wt 153.5 lb

## 2015-05-31 DIAGNOSIS — M810 Age-related osteoporosis without current pathological fracture: Secondary | ICD-10-CM | POA: Diagnosis not present

## 2015-05-31 DIAGNOSIS — S92901D Unspecified fracture of right foot, subsequent encounter for fracture with routine healing: Secondary | ICD-10-CM

## 2015-05-31 MED ORDER — LOSARTAN POTASSIUM 50 MG PO TABS: ORAL_TABLET | ORAL | Status: DC

## 2015-05-31 MED ORDER — METOPROLOL SUCCINATE ER 25 MG PO TB24: 25.0000 mg | ORAL_TABLET | Freq: Every day | ORAL | Status: DC

## 2015-05-31 NOTE — Patient Instructions (Signed)
Osteoporosis treatment               Reclast - IV infusion.  Once a year.  Two side effects to mention - osteonecrosis of jaw and bone pain.

## 2015-05-31 NOTE — Progress Notes (Signed)
Pre-visit discussion using our clinic review tool. No additional management support is needed unless otherwise documented below in the visit note.  

## 2015-05-31 NOTE — Progress Notes (Signed)
Patient ID: Janet Moran, female   DOB: 03/12/1926, 82 y.o.   MRN: [id number]   Subjective:    Patient ID: Janet Moran, female    DOB: 05-04-1925, 82 y.o.   MRN: [id number]  HPI  Patient here to discuss her bone density results and treatment options.  We discussed that her scan revealed osteoporosis.  Discussed treatment options including oral bisphosphonates and reclast.  Discussed calcium, vitamin D and weight bearing exercise.  She desires to hold on treatment at this time.     Past Medical History  Diagnosis Date  . Arthritis   . Chicken pox   . GERD (gastroesophageal reflux disease)   . Glaucoma   . Hypertension   . Hypercholesterolemia    Past Surgical History  Procedure Laterality Date  . Tonsillectomy and adenoidectomy  1935  . Abdominal hysterectomy  1976   Family History  Problem Relation Age of Onset  . Stroke Mother   . Stroke Father   . Diabetes Maternal Grandmother   . Diabetes Maternal Grandfather   . Lung cancer Sister     died 11  . Breast cancer Maternal Aunt     63's   Social History   Social History  . Marital Status: Married    Spouse Name: N/A  . Number of Children: N/A  . Years of Education: N/A   Social History Main Topics  . Smoking status: Never Smoker   . Smokeless tobacco: Never Used  . Alcohol Use: No  . Drug Use: No  . Sexual Activity: Not Asked   Other Topics Concern  . None   Social History Narrative    Outpatient Encounter Prescriptions as of 05/31/2015  Medication Sig  . amLODipine (NORVASC) 5 MG tablet TAKE ONE (1) TABLET EACH DAY  . aspirin EC 81 MG tablet Take 81 mg by mouth daily.  . calcium carbonate (OS-CAL) 600 MG TABS Take 600 mg by mouth 2 (two) times daily with a meal. Take 1 tablet by mouth twice daily with food  . latanoprost (XALATAN) 0.005 % ophthalmic solution Place 1 drop into both eyes at bedtime.  Marland Kitchen losartan (COZAAR) 50 MG tablet TAKE ONE (1) TABLET BY MOUTH EVERY DAY  . metoprolol succinate  (TOPROL XL) 25 MG 24 hr tablet Take 1 tablet (25 mg total) by mouth daily.  . naproxen sodium (ANAPROX) 220 MG tablet Take 220 mg by mouth 2 (two) times daily with a meal.  . omeprazole (PRILOSEC) 20 MG capsule Take 20 mg by mouth daily.  . simvastatin (ZOCOR) 20 MG tablet TAKE ONE TABLET BY MOUTH EVERY EVENING.  Marland Kitchen timolol (BETIMOL) 0.25 % ophthalmic solution Place 1-2 drops into the right eye 2 (two) times daily.   . [DISCONTINUED] losartan (COZAAR) 50 MG tablet TAKE ONE (1) TABLET BY MOUTH EVERY DAY  . [DISCONTINUED] metoprolol succinate (TOPROL XL) 25 MG 24 hr tablet Take 1 tablet (25 mg total) by mouth daily.  Marland Kitchen amoxicillin (AMOXIL) 875 MG tablet Reported on 05/31/2015   Facility-Administered Encounter Medications as of 05/31/2015  Medication  . cyanocobalamin ((VITAMIN B-12)) injection 1,000 mcg    Review of Systems  Constitutional: Negative for appetite change and unexpected weight change.  HENT: Negative for congestion and sinus pressure.   Respiratory: Negative for cough, chest tightness and shortness of breath.   Cardiovascular: Negative for chest pain and palpitations.  Gastrointestinal: Negative for nausea, vomiting, abdominal pain and diarrhea.       Reflux controlled.   Musculoskeletal: Negative  for back pain and joint swelling.       Objective:    Physical Exam  Constitutional: She appears well-developed and well-nourished. No distress.    BP 120/70 mmHg  Pulse 58  Temp(Src) 97.9 F (36.6 C) (Oral)  Resp 18  Ht 5' 5.5" (1.664 m)  Wt 153 lb 8 oz (69.627 kg)  BMI 25.15 kg/m2  SpO2 97% Wt Readings from Last 3 Encounters:  05/31/15 153 lb 8 oz (69.627 kg)  03/18/15 150 lb (68.04 kg)  01/30/15 145 lb (65.772 kg)   No physical exam performed given nature of visit.   Lab Results  Component Value Date   WBC 7.4 09/30/2014   HGB 14.3 09/30/2014   HCT 42.3 09/30/2014   PLT 208.0 09/30/2014   GLUCOSE 98 03/15/2015   CHOL 188 03/15/2015   TRIG 139.0 03/15/2015     HDL 56.10 03/15/2015   LDLCALC 104* 03/15/2015   ALT 17 03/15/2015   AST 18 03/15/2015   NA 139 03/15/2015   K 3.9 03/15/2015   CL 102 03/15/2015   CREATININE 0.65 03/15/2015   BUN 10 03/15/2015   CO2 30 03/15/2015   TSH 3.29 09/30/2014    Dg Bone Density  05/03/2015  EXAM: DUAL X-RAY ABSORPTIOMETRY (DXA) FOR BONE MINERAL DENSITY IMPRESSION: Dear Dr. Lorin Picket, Your patient Janet Moran completed a BMD test on 05/03/2015 using the Lunar iDXA DXA System (analysis version: 14.10) manufactured by [employer]. The following summarizes the results of our evaluation. PATIENT BIOGRAPHICAL: Name: Janet Moran, Janet Moran Patient ID: [id number] Birth Date: 1925/07/24 Height: 65.5 in. Gender: Female Exam Date: 05/03/2015 Weight: 150.0 lbs. Indications: Advanced Age, Caucasian, Height Loss, History of Fracture (Adult), Hysterectomy Fractures: Right foot Treatments: CALCIUM VIT D ASSESSMENT: The BMD measured at Forearm Radius 33% is 0.569 g/cm2 with a T-score of -3.5. This patient is considered osteoporotic according to World Health Organization [facility]) criteria. Lumbar spine was not utilized due to scoliosis. Site Region Measured Measured WHO Young Adult BMD Date       Age      Classification T-score DualFemur Total Left 05/03/2015 87.9 Osteopenia -1.8 0.782 g/cm2 Left Forearm Radius 33% 05/03/2015 87.9 Osteoporosis -3.5 0.569 g/cm2 World Health Organization [facility]) criteria for post-menopausal, Caucasian Women: Normal:       T-score at or above -1 SD Osteopenia:   T-score between -1 and -2.5 SD Osteoporosis: T-score at or below -2.5 SD RECOMMENDATIONS: National Osteoporosis Foundation recommends that FDA-approved medical therapies be considered in postmenopausal women and men age 42 or older with a: 1. Hip or vertebral (clinical or morphometric) fracture. 2. T-score of < -2.5 at the spine or hip. 3. Ten-year fracture probability by FRAX of 3% or greater for hip fracture or 20% or greater for major osteoporotic fracture.  All treatment decisions require clinical judgment and consideration of individual patient factors, including patient preferences, co-morbidities, previous drug use, risk factors not captured in the FRAX model (e.g. falls, vitamin D deficiency, increased bone turnover, interval significant decline in bone density) and possible under - or over-estimation of fracture risk by FRAX. All patients should ensure an adequate intake of dietary calcium (1200 mg/d) and vitamin D (800 IU daily) unless contraindicated. FOLLOW-UP: People with diagnosed cases of osteoporosis or at high risk for fracture should have regular bone mineral density tests. For patients eligible for Medicare, routine testing is allowed once every 2 years. The testing frequency can be increased to one year for patients who have rapidly progressing disease, those who are receiving  or discontinuing medical therapy to restore bone mass, or have additional risk factors. I have reviewed this report, and agree with the above findings. [facility] Radiology Electronically Signed   By: Charlett Nose M.D.   On: 05/03/2015 13:08   Mm Digital Screening Bilateral  05/03/2015  CLINICAL DATA:  Screening. EXAM: DIGITAL SCREENING BILATERAL MAMMOGRAM WITH CAD COMPARISON:  Previous exam(s). ACR Breast Density Category b: There are scattered areas of fibroglandular density. FINDINGS: There are no findings suspicious for malignancy. Images were processed with CAD. IMPRESSION: No mammographic evidence of malignancy. A result letter of this screening mammogram will be mailed directly to the patient. RECOMMENDATION: Screening mammogram in one year if felt to be clinically indicated. (Code:SM-B-01Y) BI-RADS CATEGORY  1: Negative. Electronically Signed   By: Rolla Plate M.D.   On: 05/03/2015 13:02       Assessment & Plan:   Problem List Items Addressed This Visit    Foot fracture, right    Seeing podiatry.  Stable.        Osteoporosis - Primary    Discussed bone  density.  Discussed treatment options.  She prefers to hold on treatment at this time.  Continue calcium, vitamin D and weight bearing exercise.  Follow.          I spent 15 minutes with the patient and more than 50% of the time was spent in consultation regarding the above.     Dale Weskan, MD

## 2015-06-12 ENCOUNTER — Encounter: Payer: Self-pay | Admitting: Internal Medicine

## 2015-06-13 DIAGNOSIS — M810 Age-related osteoporosis without current pathological fracture: Secondary | ICD-10-CM | POA: Insufficient documentation

## 2015-06-13 NOTE — Assessment & Plan Note (Signed)
Discussed bone density.  Discussed treatment options.  She prefers to hold on treatment at this time.  Continue calcium, vitamin D and weight bearing exercise.  Follow.

## 2015-06-13 NOTE — Assessment & Plan Note (Signed)
Seeing podiatry.  Stable.

## 2015-06-20 ENCOUNTER — Ambulatory Visit: Payer: Self-pay | Admitting: Internal Medicine

## 2015-06-21 ENCOUNTER — Ambulatory Visit (INDEPENDENT_AMBULATORY_CARE_PROVIDER_SITE_OTHER): Payer: PPO | Admitting: Sports Medicine

## 2015-06-21 ENCOUNTER — Encounter: Payer: Self-pay | Admitting: Sports Medicine

## 2015-06-21 ENCOUNTER — Ambulatory Visit: Payer: Self-pay

## 2015-06-21 DIAGNOSIS — M79671 Pain in right foot: Secondary | ICD-10-CM

## 2015-06-21 DIAGNOSIS — S92301K Fracture of unspecified metatarsal bone(s), right foot, subsequent encounter for fracture with nonunion: Secondary | ICD-10-CM

## 2015-06-21 DIAGNOSIS — S82891D Other fracture of right lower leg, subsequent encounter for closed fracture with routine healing: Secondary | ICD-10-CM | POA: Diagnosis not present

## 2015-06-21 DIAGNOSIS — G629 Polyneuropathy, unspecified: Secondary | ICD-10-CM

## 2015-06-21 DIAGNOSIS — M810 Age-related osteoporosis without current pathological fracture: Secondary | ICD-10-CM | POA: Diagnosis not present

## 2015-06-21 NOTE — Progress Notes (Signed)
Patient ID: Janet Moran, female   DOB: 1925/10/26, 82 y.o.   MRN: [id number] Subjective: Janet Moran is a 82 y.o. female patient who returns to office for evaluation of Right foot pain and fracture care; 20.5 weeks since injury. Patient states that she has no pain; her foot feels "great". Denies calf, chest pain, SOB or any other symptoms. Patient denies any other pedal complaints.   Patient Active Problem List   Diagnosis Date Noted  . Osteoporosis 06/13/2015  . Foot fracture, right 03/20/2015  . Malaise 09/30/2014  . Fatigue 07/18/2014  . Health care maintenance 07/18/2014  . Environmental allergies 03/14/2014  . Cough 12/27/2013  . Abnormal liver function test 12/27/2013  . Light headedness 12/17/2013  . Sinusitis 12/13/2013  . Tongue lesion 08/02/2013  . Back pain 04/09/2013  . Hypertension 01/14/2012  . Hypercholesterolemia 01/14/2012  . Hyponatremia 01/14/2012  . GERD (gastroesophageal reflux disease) 01/14/2012   Current Outpatient Prescriptions on File Prior to Visit  Medication Sig Dispense Refill  . amLODipine (NORVASC) 5 MG tablet TAKE ONE (1) TABLET EACH DAY 30 tablet 10  . amoxicillin (AMOXIL) 875 MG tablet Reported on 05/31/2015    . aspirin EC 81 MG tablet Take 81 mg by mouth daily.    . calcium carbonate (OS-CAL) 600 MG TABS Take 600 mg by mouth 2 (two) times daily with a meal. Take 1 tablet by mouth twice daily with food    . latanoprost (XALATAN) 0.005 % ophthalmic solution Place 1 drop into both eyes at bedtime.    Marland Kitchen. losartan (COZAAR) 50 MG tablet TAKE ONE (1) TABLET BY MOUTH EVERY DAY 30 tablet 6  . metoprolol succinate (TOPROL XL) 25 MG 24 hr tablet Take 1 tablet (25 mg total) by mouth daily. 30 tablet 11  . naproxen sodium (ANAPROX) 220 MG tablet Take 220 mg by mouth 2 (two) times daily with a meal.    . omeprazole (PRILOSEC) 20 MG capsule Take 20 mg by mouth daily.    . simvastatin (ZOCOR) 20 MG tablet TAKE ONE TABLET BY MOUTH EVERY EVENING. 30 tablet 11   . timolol (BETIMOL) 0.25 % ophthalmic solution Place 1-2 drops into the right eye 2 (two) times daily.      Current Facility-Administered Medications on File Prior to Visit  Medication Dose Route Frequency Provider Last Rate Last Dose  . cyanocobalamin ((VITAMIN B-12)) injection 1,000 mcg  1,000 mcg Intramuscular Once Dale Durhamharlene Scott, MD   1,000 mcg at 03/15/15 [id number]   Allergies  Allergen Reactions  . Avelox [Moxifloxacin]     Dizziness  . Crestor [Rosuvastatin]   . Pravachol [Pravastatin]   . Vytorin [Ezetimibe-Simvastatin]     Objective:  General: Alert and oriented x3 in no acute distress  Dermatology: No open lesions bilateral lower extremities, no webspace macerations,  No ecchymosis to right foot and ankle, all nails x 10 are short and mildly thickening with subungal debris, asymptomatic at this encounter.   Vascular: No focal edema noted to right foot and ankle. Dorsalis Pedis and Posterior Tibial pedal pulses 1/4, Capillary Fill Time 4 seconds,(-) pedal hair growth bilateral, Temperature gradient within normal limits.  Neurology: Gross sensation intact via light touch bilateral, Vibratory with tuning fork and Protective sensation significantly diminished with Semmes Weinstein Monofilament to all pedal sites unchanged from prior, No babinski sign present bilateral. (- )Tinels sign right foot.    Musculoskeletal: There is no tenderness with palpation at lateral malleolus and base of 5th metatarsal on Right foot, no  tenderness to medial ankle on right, There is no pain present with tuning fork to lateral malleolus and 5th metatarsal base on Right foot, No pain with calf compression bilateral. All joint range of motion is within normal limits, Asymptomatic bunion and hammertoe deformities bilateral, Strength within normal limits on left and acceptable on right for healed fractured condition.    X-rays   Right Foot/ankle:    Impression: Decreased osseous mineralization.  Nondisplaced  fracture of the right fifth metatarsal base (jones fracture) with visible fracture healing that appears to be consolidated and a healed chip fracture at right ankle lateral malleolus with mild radiopate debris at medial ankle gutter that is improved in nature. Soft tissues within normal limits. No foreign body.   Assessment and Plan: Problem List Items Addressed This Visit      Musculoskeletal and Integument   Osteoporosis    Other Visit Diagnoses    Right foot pain    -  Primary    Relevant Orders    DG Foot Complete Right    Closed fracture of ankle, right, with routine healing, subsequent encounter        Healed    Closed fracture of metatarsal bone, right, with nonunion, subsequent encounter        5th Metatarsal, Prematurely healed    Neuropathy (HCC)          -Complete examination performed -Xrays reviewed -Discussed treatement options for fracture; risks, alternatives, and benefits explained.  -Patient may discontinue Exogen bone stimulation  -Patient to wean fromCAM walker to good supportive sneaker -[facility] at [facility] for gait and stability after healing by conservative means for foot and ankle fractureve. -Cont. Tramadol pain med as needed -Patient to return to office in 4 weeks for serial x-rays to assess completed healing  or sooner if condition worsens.  Asencion Islam, DPM

## 2015-06-21 NOTE — Patient Instructions (Signed)
Slowly wean from CAM boot on right foot by increasing each day by 1 hour increments with wearing a normal good supportive tennis shoe Good supportive shoes can be purchased from [employer]; Recommend tennis shoe. [employer] or [employer] name brand

## 2015-06-30 DIAGNOSIS — M25571 Pain in right ankle and joints of right foot: Secondary | ICD-10-CM | POA: Diagnosis not present

## 2015-07-05 DIAGNOSIS — M25571 Pain in right ankle and joints of right foot: Secondary | ICD-10-CM | POA: Diagnosis not present

## 2015-07-07 DIAGNOSIS — M25571 Pain in right ankle and joints of right foot: Secondary | ICD-10-CM | POA: Diagnosis not present

## 2015-07-12 DIAGNOSIS — M25571 Pain in right ankle and joints of right foot: Secondary | ICD-10-CM | POA: Diagnosis not present

## 2015-07-14 ENCOUNTER — Encounter: Payer: Self-pay | Admitting: Internal Medicine

## 2015-07-14 ENCOUNTER — Other Ambulatory Visit (INDEPENDENT_AMBULATORY_CARE_PROVIDER_SITE_OTHER): Payer: PPO

## 2015-07-14 DIAGNOSIS — E7800 Pure hypercholesterolemia, unspecified: Secondary | ICD-10-CM

## 2015-07-14 DIAGNOSIS — I10 Essential (primary) hypertension: Secondary | ICD-10-CM | POA: Diagnosis not present

## 2015-07-14 DIAGNOSIS — M25571 Pain in right ankle and joints of right foot: Secondary | ICD-10-CM | POA: Diagnosis not present

## 2015-07-14 LAB — BASIC METABOLIC PANEL
BUN: 13 mg/dL (ref 6–23)
CALCIUM: 9.9 mg/dL (ref 8.4–10.5)
CO2: 30 mEq/L (ref 19–32)
Chloride: 102 mEq/L (ref 96–112)
Creatinine, Ser: 0.65 mg/dL (ref 0.40–1.20)
GFR: 91.4 mL/min (ref >60.00)
GLUCOSE: 98 mg/dL (ref 70–99)
Potassium: 3.9 mEq/L (ref 3.5–5.1)
Sodium: 137 mEq/L (ref 135–145)

## 2015-07-14 LAB — HEPATIC FUNCTION PANEL
ALT: 14 U/L (ref 0–35)
AST: 16 U/L (ref 0–37)
Albumin: 4.1 g/dL (ref 3.5–5.2)
Alkaline Phosphatase: 69 U/L (ref 39–117)
BILIRUBIN TOTAL: 0.7 mg/dL (ref 0.2–1.2)
Bilirubin, Direct: 0.1 mg/dL (ref 0.0–0.3)
Total Protein: 6.9 g/dL (ref 6.0–8.3)

## 2015-07-14 LAB — LIPID PANEL
CHOLESTEROL: 184 mg/dL (ref 0–200)
HDL: 60.2 mg/dL (ref >39.00)
LDL CALC: 95 mg/dL (ref 0–99)
NonHDL: 123.85
TRIGLYCERIDES: 145 mg/dL (ref 0.0–149.0)
Total CHOL/HDL Ratio: 3
VLDL: 29 mg/dL (ref 0.0–40.0)

## 2015-07-15 ENCOUNTER — Ambulatory Visit (INDEPENDENT_AMBULATORY_CARE_PROVIDER_SITE_OTHER): Payer: PPO

## 2015-07-15 ENCOUNTER — Ambulatory Visit (INDEPENDENT_AMBULATORY_CARE_PROVIDER_SITE_OTHER): Payer: PPO | Admitting: Sports Medicine

## 2015-07-15 ENCOUNTER — Encounter: Payer: Self-pay | Admitting: Sports Medicine

## 2015-07-15 DIAGNOSIS — S92301K Fracture of unspecified metatarsal bone(s), right foot, subsequent encounter for fracture with nonunion: Secondary | ICD-10-CM

## 2015-07-15 DIAGNOSIS — M810 Age-related osteoporosis without current pathological fracture: Secondary | ICD-10-CM | POA: Diagnosis not present

## 2015-07-15 DIAGNOSIS — S82891D Other fracture of right lower leg, subsequent encounter for closed fracture with routine healing: Secondary | ICD-10-CM | POA: Diagnosis not present

## 2015-07-15 DIAGNOSIS — M79671 Pain in right foot: Secondary | ICD-10-CM

## 2015-07-15 DIAGNOSIS — G629 Polyneuropathy, unspecified: Secondary | ICD-10-CM

## 2015-07-15 NOTE — Progress Notes (Signed)
Patient ID: Caren Hazyancy J Polito, female   DOB: [birth year], 82 y.o.   MRN: [id number]  Subjective: Caren Hazyancy J Bragdon is a 82 y.o. female patient who returns to office for evaluation of Right foot and ankle fracture; has transitioned to normal shoes and is in PT with no problems; patient is here for final xray to assess final healing. Denies calf, chest pain, SOB or any other symptoms. Patient denies any other pedal complaints.   Patient Active Problem List   Diagnosis Date Noted  . Osteoporosis 06/13/2015  . Foot fracture, right 03/20/2015  . Malaise 09/30/2014  . Fatigue 07/18/2014  . Health care maintenance 07/18/2014  . Environmental allergies 03/14/2014  . Cough 12/27/2013  . Abnormal liver function test 12/27/2013  . Light headedness 12/17/2013  . Sinusitis 12/13/2013  . Tongue lesion 08/02/2013  . Back pain 04/09/2013  . Hypertension 01/14/2012  . Hypercholesterolemia 01/14/2012  . Hyponatremia 01/14/2012  . GERD (gastroesophageal reflux disease) 01/14/2012   Current Outpatient Prescriptions on File Prior to Visit  Medication Sig Dispense Refill  . amLODipine (NORVASC) 5 MG tablet TAKE ONE (1) TABLET EACH DAY 30 tablet 10  . amoxicillin (AMOXIL) 875 MG tablet Reported on 05/31/2015    . aspirin EC 81 MG tablet Take 81 mg by mouth daily.    . calcium carbonate (OS-CAL) 600 MG TABS Take 600 mg by mouth 2 (two) times daily with a meal. Take 1 tablet by mouth twice daily with food    . latanoprost (XALATAN) 0.005 % ophthalmic solution Place 1 drop into both eyes at bedtime.    Marland Kitchen. losartan (COZAAR) 50 MG tablet TAKE ONE (1) TABLET BY MOUTH EVERY DAY 30 tablet 6  . metoprolol succinate (TOPROL XL) 25 MG 24 hr tablet Take 1 tablet (25 mg total) by mouth daily. 30 tablet 11  . naproxen sodium (ANAPROX) 220 MG tablet Take 220 mg by mouth 2 (two) times daily with a meal.    . omeprazole (PRILOSEC) 20 MG capsule Take 20 mg by mouth daily.    . simvastatin (ZOCOR) 20 MG tablet TAKE ONE TABLET BY  MOUTH EVERY EVENING. 30 tablet 11  . timolol (BETIMOL) 0.25 % ophthalmic solution Place 1-2 drops into the right eye 2 (two) times daily.      Current Facility-Administered Medications on File Prior to Visit  Medication Dose Route Frequency Provider Last Rate Last Dose  . cyanocobalamin ((VITAMIN B-12)) injection 1,000 mcg  1,000 mcg Intramuscular Once Dale Durhamharlene Scott, MD   1,000 mcg at 03/15/15 [id number]   Allergies  Allergen Reactions  . Avelox [Moxifloxacin]     Dizziness  . Crestor [Rosuvastatin]   . Pravachol [Pravastatin]   . Vytorin [Ezetimibe-Simvastatin]     Objective:  General: Alert and oriented x3 in no acute distress  Dermatology: No open lesions bilateral lower extremities, no webspace macerations,  No ecchymosis to right foot and ankle, all nails x 10 are short and mildly thickening with subungal debris, asymptomatic at this encounter.   Vascular: No focal edema noted to right foot and ankle. Dorsalis Pedis and Posterior Tibial pedal pulses 1/4, Capillary Fill Time 4 seconds,(-) pedal hair growth bilateral, Temperature gradient within normal limits.  Neurology: Gross sensation intact via light touch bilateral, Vibratory with tuning fork and Protective sensation significantly diminished with Semmes Weinstein Monofilament to all pedal sites unchanged from prior, No babinski sign present bilateral. (- )Tinels sign right foot.    Musculoskeletal: There is no tenderness with palpation at lateral malleolus and base  of 5th metatarsal on Right foot, no tenderness to medial ankle on right, There is no pain present with tuning fork to lateral malleolus and 5th metatarsal base on Right foot, No pain with calf compression bilateral. All joint range of motion is within normal limits, Asymptomatic bunion and hammertoe deformities bilateral, Strength within normal limits on left and acceptable on right for healed fractured condition.    X-rays   Right Foot/ankle:    Impression: Decreased  osseous mineralization.  Nondisplaced fracture of the right fifth metatarsal base (jones fracture) healed and a healed chip fracture at right ankle lateral malleolus with mild radiopate debris at medial ankle gutter that is resolved. Soft tissues within normal limits. No foreign body.   Assessment and Plan: Problem List Items Addressed This Visit      Musculoskeletal and Integument   Osteoporosis    Other Visit Diagnoses    Right foot pain    -  Primary    Resolved    Relevant Orders    DG Foot Complete Right    Closed fracture of ankle, right, with routine healing, subsequent encounter        Resolved    Closed fracture of metatarsal bone, right, with nonunion, subsequent encounter        Resolved    Neuropathy (HCC)          -Complete examination performed -Xrays reviewed -Discussed long term plan of care -Cont with PT until completed -Cont with good supportive shoes daily -Patient to return to office as needed or sooner if condition worsens.  Asencion Islam, DPM

## 2015-07-19 ENCOUNTER — Ambulatory Visit (INDEPENDENT_AMBULATORY_CARE_PROVIDER_SITE_OTHER): Payer: PPO | Admitting: Internal Medicine

## 2015-07-19 ENCOUNTER — Encounter: Payer: Self-pay | Admitting: Internal Medicine

## 2015-07-19 VITALS — BP 138/70 | HR 60 | Temp 97.9°F | Resp 18 | Ht 65.0 in | Wt 151.2 lb

## 2015-07-19 DIAGNOSIS — E7800 Pure hypercholesterolemia, unspecified: Secondary | ICD-10-CM | POA: Diagnosis not present

## 2015-07-19 DIAGNOSIS — Z91048 Other nonmedicinal substance allergy status: Secondary | ICD-10-CM

## 2015-07-19 DIAGNOSIS — I10 Essential (primary) hypertension: Secondary | ICD-10-CM

## 2015-07-19 DIAGNOSIS — Z0000 Encounter for general adult medical examination without abnormal findings: Secondary | ICD-10-CM

## 2015-07-19 DIAGNOSIS — K219 Gastro-esophageal reflux disease without esophagitis: Secondary | ICD-10-CM | POA: Diagnosis not present

## 2015-07-19 DIAGNOSIS — M25571 Pain in right ankle and joints of right foot: Secondary | ICD-10-CM | POA: Diagnosis not present

## 2015-07-19 DIAGNOSIS — S92901D Unspecified fracture of right foot, subsequent encounter for fracture with routine healing: Secondary | ICD-10-CM | POA: Diagnosis not present

## 2015-07-19 DIAGNOSIS — Z9109 Other allergy status, other than to drugs and biological substances: Secondary | ICD-10-CM

## 2015-07-19 NOTE — Progress Notes (Signed)
Pre-visit discussion using our clinic review tool. No additional management support is needed unless otherwise documented below in the visit note.  

## 2015-07-19 NOTE — Progress Notes (Signed)
Patient ID: Janet Moran, female   DOB: 1925/12/13, 82 y.o.   MRN: [id number]   Subjective:    Patient ID: Janet Moran, female    DOB: November 12, 1925, 82 y.o.   MRN: [id number]  HPI  Patient with past history of hypercholesterolemia, allergies and hypertension.  She comes in today to follow up on these issues.  She is s/p fracture of her right foot.  Is going to physical therapy now.  Still with some foot pain, but is better.  She is going walking now.  No chest pain or tightness.  No sob.  No acid reflux.  No abdominal pain or cramping.  Bowels stable.     Past Medical History  Diagnosis Date  . Arthritis   . Chicken pox   . GERD (gastroesophageal reflux disease)   . Glaucoma   . Hypertension   . Hypercholesterolemia    Past Surgical History  Procedure Laterality Date  . Tonsillectomy and adenoidectomy  1935  . Abdominal hysterectomy  1976   Family History  Problem Relation Age of Onset  . Stroke Mother   . Stroke Father   . Diabetes Maternal Grandmother   . Diabetes Maternal Grandfather   . Lung cancer Sister     died 79  . Breast cancer Maternal Aunt     33's   Social History   Social History  . Marital Status: Married    Spouse Name: N/A  . Number of Children: N/A  . Years of Education: N/A   Social History Main Topics  . Smoking status: Never Smoker   . Smokeless tobacco: Never Used  . Alcohol Use: No  . Drug Use: No  . Sexual Activity: Not Asked   Other Topics Concern  . None   Social History Narrative    Outpatient Encounter Prescriptions as of 07/19/2015  Medication Sig  . amLODipine (NORVASC) 5 MG tablet TAKE ONE (1) TABLET EACH DAY  . amoxicillin (AMOXIL) 875 MG tablet Reported on 05/31/2015  . aspirin EC 81 MG tablet Take 81 mg by mouth daily.  . calcium carbonate (OS-CAL) 600 MG TABS Take 600 mg by mouth 2 (two) times daily with a meal. Take 1 tablet by mouth twice daily with food  . dorzolamide-timolol (COSOPT) 22.3-6.8 MG/ML ophthalmic  solution   . latanoprost (XALATAN) 0.005 % ophthalmic solution Place 1 drop into both eyes at bedtime.  Marland Kitchen losartan (COZAAR) 50 MG tablet TAKE ONE (1) TABLET BY MOUTH EVERY DAY  . metoprolol succinate (TOPROL XL) 25 MG 24 hr tablet Take 1 tablet (25 mg total) by mouth daily.  . naproxen sodium (ANAPROX) 220 MG tablet Take 220 mg by mouth 2 (two) times daily with a meal.  . omeprazole (PRILOSEC) 20 MG capsule Take 20 mg by mouth daily.  . simvastatin (ZOCOR) 20 MG tablet TAKE ONE TABLET BY MOUTH EVERY EVENING.  Marland Kitchen timolol (BETIMOL) 0.25 % ophthalmic solution Place 1-2 drops into the right eye 2 (two) times daily.    Facility-Administered Encounter Medications as of 07/19/2015  Medication  . cyanocobalamin ((VITAMIN B-12)) injection 1,000 mcg    Review of Systems  Constitutional: Negative for appetite change and unexpected weight change.  HENT: Negative for congestion and sinus pressure.   Respiratory: Negative for cough, chest tightness and shortness of breath.   Cardiovascular: Negative for chest pain, palpitations and leg swelling.  Gastrointestinal: Negative for nausea, vomiting, abdominal pain and diarrhea.  Genitourinary: Negative for dysuria and difficulty urinating.  Musculoskeletal: Negative for  back pain and joint swelling.       Some persistent foot pain as outlined.  Going to therapy.   Skin: Negative for color change and rash.  Neurological: Negative for dizziness, light-headedness and headaches.  Psychiatric/Behavioral: Negative for dysphoric mood and agitation.       Objective:    Physical Exam  Constitutional: She appears well-developed and well-nourished. No distress.  HENT:  Nose: Nose normal.  Mouth/Throat: Oropharynx is clear and moist.  Neck: Neck supple. No thyromegaly present.  Cardiovascular: Normal rate and regular rhythm.   Pulmonary/Chest: Breath sounds normal. No respiratory distress. She has no wheezes.  Abdominal: Soft. Bowel sounds are normal. There is  no tenderness.  Musculoskeletal: She exhibits no edema or tenderness.  Lymphadenopathy:    She has no cervical adenopathy.  Skin: No rash noted. No erythema.  Psychiatric: She has a normal mood and affect. Her behavior is normal.    BP 138/70 mmHg  Pulse 60  Temp(Src) 97.9 F (36.6 C) (Oral)  Resp 18  Ht 5\' 5"  (1.651 m)  Wt 151 lb 4 oz (68.607 kg)  BMI 25.17 kg/m2  SpO2 97% Wt Readings from Last 3 Encounters:  07/19/15 151 lb 4 oz (68.607 kg)  05/31/15 153 lb 8 oz (69.627 kg)  03/18/15 150 lb (68.04 kg)     Lab Results  Component Value Date   WBC 7.4 09/30/2014   HGB 14.3 09/30/2014   HCT 42.3 09/30/2014   PLT 208.0 09/30/2014   GLUCOSE 98 07/14/2015   CHOL 184 07/14/2015   TRIG 145.0 07/14/2015   HDL 60.20 07/14/2015   LDLCALC 95 07/14/2015   ALT 14 07/14/2015   AST 16 07/14/2015   NA 137 07/14/2015   K 3.9 07/14/2015   CL 102 07/14/2015   CREATININE 0.65 07/14/2015   BUN 13 07/14/2015   CO2 30 07/14/2015   TSH 3.29 09/30/2014    Dg Bone Density  05/03/2015  EXAM: DUAL X-RAY ABSORPTIOMETRY (DXA) FOR BONE MINERAL DENSITY IMPRESSION: Dear Dr. Lorin PicketScott, Your patient Virl Diamondancy Oestreicher completed a BMD test on 05/03/2015 using the Lunar iDXA DXA System (analysis version: 14.10) manufactured by [employer]. The following summarizes the results of our evaluation. PATIENT BIOGRAPHICAL: Name: Janet HazyMichael, Neal J Patient ID: [id number] Birth Date: [birth year] Height: 65.5 in. Gender: Female Exam Date: 05/03/2015 Weight: 150.0 lbs. Indications: Advanced Age, Caucasian, Height Loss, History of Fracture (Adult), Hysterectomy Fractures: Right foot Treatments: CALCIUM VIT D ASSESSMENT: The BMD measured at Forearm Radius 33% is 0.569 g/cm2 with a T-score of -3.5. This patient is considered osteoporotic according to World Health Organization [facility](WHO) criteria. Lumbar spine was not utilized due to scoliosis. Site Region Measured Measured WHO Young Adult BMD Date       Age      Classification T-score  DualFemur Total Left 05/03/2015 87.9 Osteopenia -1.8 0.782 g/cm2 Left Forearm Radius 33% 05/03/2015 87.9 Osteoporosis -3.5 0.569 g/cm2 World Health Organization [facility](WHO) criteria for post-menopausal, Caucasian Women: Normal:       T-score at or above -1 SD Osteopenia:   T-score between -1 and -2.5 SD Osteoporosis: T-score at or below -2.5 SD RECOMMENDATIONS: National Osteoporosis Foundation recommends that FDA-approved medical therapies be considered in postmenopausal women and men age 80 or older with a: 1. Hip or vertebral (clinical or morphometric) fracture. 2. T-score of < -2.5 at the spine or hip. 3. Ten-year fracture probability by FRAX of 3% or greater for hip fracture or 20% or greater for major osteoporotic fracture. All treatment decisions  require clinical judgment and consideration of individual patient factors, including patient preferences, co-morbidities, previous drug use, risk factors not captured in the FRAX model (e.g. falls, vitamin D deficiency, increased bone turnover, interval significant decline in bone density) and possible under - or over-estimation of fracture risk by FRAX. All patients should ensure an adequate intake of dietary calcium (1200 mg/d) and vitamin D (800 IU daily) unless contraindicated. FOLLOW-UP: People with diagnosed cases of osteoporosis or at high risk for fracture should have regular bone mineral density tests. For patients eligible for Medicare, routine testing is allowed once every 2 years. The testing frequency can be increased to one year for patients who have rapidly progressing disease, those who are receiving or discontinuing medical therapy to restore bone mass, or have additional risk factors. I have reviewed this report, and agree with the above findings. [facility] Radiology Electronically Signed   By: Charlett Nose M.D.   On: 05/03/2015 13:08   Mm Digital Screening Bilateral  05/03/2015  CLINICAL DATA:  Screening. EXAM: DIGITAL SCREENING BILATERAL MAMMOGRAM  WITH CAD COMPARISON:  Previous exam(s). ACR Breast Density Category b: There are scattered areas of fibroglandular density. FINDINGS: There are no findings suspicious for malignancy. Images were processed with CAD. IMPRESSION: No mammographic evidence of malignancy. A result letter of this screening mammogram will be mailed directly to the patient. RECOMMENDATION: Screening mammogram in one year if felt to be clinically indicated. (Code:SM-B-01Y) BI-RADS CATEGORY  1: Negative. Electronically Signed   By: Rolla Plate M.D.   On: 05/03/2015 13:02       Assessment & Plan:   Problem List Items Addressed This Visit    Environmental allergies    Under reasonable control.        Foot fracture, right    Being followed by podiatry.  Going to therapy.  Follow.        GERD (gastroesophageal reflux disease)    Upper symptoms controlled.   On omeprazole.       Health care maintenance    Physical today 07/19/15.  Mammogram 05/03/15 - birads I.  Colonoscopy 02/15/11 - normal.        Hypercholesterolemia    On simvastatin.  Low cholesterol diet and exercise.  Follow lipid panel and liver function tests.        Relevant Orders   Lipid panel   Hepatic function panel   Hypertension - Primary    Blood pressure under good control.  Continue same medication regimen.  Follow pressures.  Follow metabolic panel.        Relevant Orders   TSH   CBC with Differential/Platelet   Basic metabolic panel       Dale [place], MD

## 2015-07-21 DIAGNOSIS — M25571 Pain in right ankle and joints of right foot: Secondary | ICD-10-CM | POA: Diagnosis not present

## 2015-07-24 ENCOUNTER — Encounter: Payer: Self-pay | Admitting: Internal Medicine

## 2015-07-24 NOTE — Assessment & Plan Note (Signed)
On simvastatin.  Low cholesterol diet and exercise.  Follow lipid panel and liver function tests.   

## 2015-07-24 NOTE — Assessment & Plan Note (Signed)
Physical today 07/19/15.  Mammogram 05/03/15 - birads I.  Colonoscopy 02/15/11 - normal.

## 2015-07-24 NOTE — Assessment & Plan Note (Signed)
Under reasonable control.    

## 2015-07-24 NOTE — Assessment & Plan Note (Signed)
Upper symptoms controlled.  On omeprazole.   

## 2015-07-24 NOTE — Assessment & Plan Note (Signed)
Being followed by podiatry.  Going to therapy.  Follow.

## 2015-07-24 NOTE — Assessment & Plan Note (Signed)
Blood pressure under good control.  Continue same medication regimen.  Follow pressures.  Follow metabolic panel.   

## 2015-07-25 DIAGNOSIS — M25571 Pain in right ankle and joints of right foot: Secondary | ICD-10-CM | POA: Diagnosis not present

## 2015-07-28 DIAGNOSIS — M25571 Pain in right ankle and joints of right foot: Secondary | ICD-10-CM | POA: Diagnosis not present

## 2015-08-02 DIAGNOSIS — M25571 Pain in right ankle and joints of right foot: Secondary | ICD-10-CM | POA: Diagnosis not present

## 2015-08-05 DIAGNOSIS — M25571 Pain in right ankle and joints of right foot: Secondary | ICD-10-CM | POA: Diagnosis not present

## 2015-08-08 DIAGNOSIS — M25571 Pain in right ankle and joints of right foot: Secondary | ICD-10-CM | POA: Diagnosis not present

## 2015-08-11 DIAGNOSIS — M25571 Pain in right ankle and joints of right foot: Secondary | ICD-10-CM | POA: Diagnosis not present

## 2015-08-15 DIAGNOSIS — M25571 Pain in right ankle and joints of right foot: Secondary | ICD-10-CM | POA: Diagnosis not present

## 2015-08-15 DIAGNOSIS — H40003 Preglaucoma, unspecified, bilateral: Secondary | ICD-10-CM | POA: Diagnosis not present

## 2015-08-18 DIAGNOSIS — M25571 Pain in right ankle and joints of right foot: Secondary | ICD-10-CM | POA: Diagnosis not present

## 2015-08-22 DIAGNOSIS — M25571 Pain in right ankle and joints of right foot: Secondary | ICD-10-CM | POA: Diagnosis not present

## 2015-08-23 DIAGNOSIS — H40003 Preglaucoma, unspecified, bilateral: Secondary | ICD-10-CM | POA: Diagnosis not present

## 2015-08-24 ENCOUNTER — Ambulatory Visit (INDEPENDENT_AMBULATORY_CARE_PROVIDER_SITE_OTHER): Payer: PPO | Admitting: Podiatry

## 2015-08-24 ENCOUNTER — Encounter: Payer: Self-pay | Admitting: Podiatry

## 2015-08-24 DIAGNOSIS — M79676 Pain in unspecified toe(s): Secondary | ICD-10-CM

## 2015-08-24 DIAGNOSIS — B351 Tinea unguium: Secondary | ICD-10-CM

## 2015-08-24 DIAGNOSIS — L309 Dermatitis, unspecified: Secondary | ICD-10-CM

## 2015-08-24 MED ORDER — TRIAMCINOLONE ACETONIDE 0.1 % EX CREA: 1.0000 "application " | TOPICAL_CREAM | Freq: Two times a day (BID) | CUTANEOUS | Status: DC

## 2015-08-24 NOTE — Progress Notes (Signed)
She presents today with a chief complaint of painful elongated toenails. She is also complaining of a rash medial longitudinal aspect of her left foot. She states that she has not had any adhesive or any type of foreign body on the foot and denies any trauma to the foot and states that it does not itch.  Objective: Vital signs are stable alert and oriented 3. Pulses are palpable. Toenails are thick yellow dystrophic onychomycotic bilaterally. A slightly raised petechial type rash medial longitudinal arch which appears to be a contact dermatitis because of its rectangular shape.  Assessment: Pain limp secondary to onychomycosis and eczema left.  Plan: Triamcinolone cream to be applied twice a day. Debride toenails 1 through 5 bilateral. Follow up with us as needed.

## 2015-08-25 DIAGNOSIS — M25571 Pain in right ankle and joints of right foot: Secondary | ICD-10-CM | POA: Diagnosis not present

## 2015-09-19 ENCOUNTER — Telehealth: Payer: Self-pay | Admitting: *Deleted

## 2015-09-19 MED ORDER — TRIAMCINOLONE ACETONIDE 0.1 % EX CREA: 1.0000 "application " | TOPICAL_CREAM | Freq: Two times a day (BID) | CUTANEOUS | Status: DC

## 2015-09-19 NOTE — Telephone Encounter (Signed)
Refilled Triamcinolone Aceton 0.1% as previously ordered by Dr. Al CorpusHyatt.

## 2015-10-28 DIAGNOSIS — L821 Other seborrheic keratosis: Secondary | ICD-10-CM | POA: Diagnosis not present

## 2015-10-28 DIAGNOSIS — L570 Actinic keratosis: Secondary | ICD-10-CM | POA: Diagnosis not present

## 2015-11-15 ENCOUNTER — Other Ambulatory Visit (INDEPENDENT_AMBULATORY_CARE_PROVIDER_SITE_OTHER): Payer: PPO

## 2015-11-15 DIAGNOSIS — E7800 Pure hypercholesterolemia, unspecified: Secondary | ICD-10-CM | POA: Diagnosis not present

## 2015-11-15 DIAGNOSIS — I10 Essential (primary) hypertension: Secondary | ICD-10-CM | POA: Diagnosis not present

## 2015-11-15 LAB — BASIC METABOLIC PANEL
BUN: 12 mg/dL (ref 6–23)
CHLORIDE: 101 meq/L (ref 96–112)
CO2: 28 meq/L (ref 19–32)
CREATININE: 0.67 mg/dL (ref 0.40–1.20)
Calcium: 10.1 mg/dL (ref 8.4–10.5)
GFR: 88.19 mL/min (ref >60.00)
Glucose, Bld: 93 mg/dL (ref 70–99)
POTASSIUM: 3.8 meq/L (ref 3.5–5.1)
Sodium: 138 mEq/L (ref 135–145)

## 2015-11-15 LAB — HEPATIC FUNCTION PANEL
ALBUMIN: 4.2 g/dL (ref 3.5–5.2)
ALK PHOS: 66 U/L (ref 39–117)
ALT: 16 U/L (ref 0–35)
AST: 16 U/L (ref 0–37)
BILIRUBIN DIRECT: 0.1 mg/dL (ref 0.0–0.3)
Total Bilirubin: 0.7 mg/dL (ref 0.2–1.2)
Total Protein: 7.1 g/dL (ref 6.0–8.3)

## 2015-11-15 LAB — CBC WITH DIFFERENTIAL/PLATELET
BASOS ABS: 0 10*3/uL (ref 0.0–0.1)
Basophils Relative: 0.8 % (ref 0.0–3.0)
EOS ABS: 0.2 10*3/uL (ref 0.0–0.7)
Eosinophils Relative: 4 % (ref 0.0–5.0)
HEMATOCRIT: 43.4 % (ref 36.0–46.0)
Hemoglobin: 14.8 g/dL (ref 12.0–15.0)
LYMPHS ABS: 1.6 10*3/uL (ref 0.7–4.0)
LYMPHS PCT: 28.8 % (ref 12.0–46.0)
MCHC: 34.2 g/dL (ref 30.0–36.0)
MCV: 93 fl (ref 78.0–100.0)
Monocytes Absolute: 0.6 10*3/uL (ref 0.1–1.0)
Monocytes Relative: 10.5 % (ref 3.0–12.0)
NEUTROS ABS: 3.1 10*3/uL (ref 1.4–7.7)
NEUTROS PCT: 55.9 % (ref 43.0–77.0)
PLATELETS: 207 10*3/uL (ref 150.0–400.0)
RBC: 4.67 Mil/uL (ref 3.87–5.11)
RDW: 13.6 % (ref 11.5–15.5)
WBC: 5.6 10*3/uL (ref 4.0–10.5)

## 2015-11-15 LAB — LIPID PANEL
Cholesterol: 181 mg/dL (ref 0–200)
HDL: 63.2 mg/dL (ref >39.00)
LDL Cholesterol: 93 mg/dL (ref 0–99)
NonHDL: 118.08
TRIGLYCERIDES: 127 mg/dL (ref 0.0–149.0)
Total CHOL/HDL Ratio: 3
VLDL: 25.4 mg/dL (ref 0.0–40.0)

## 2015-11-15 LAB — TSH: TSH: 3.2 u[IU]/mL (ref 0.35–4.50)

## 2015-11-16 ENCOUNTER — Encounter: Payer: Self-pay | Admitting: Internal Medicine

## 2015-11-18 ENCOUNTER — Encounter: Payer: Self-pay | Admitting: Internal Medicine

## 2015-11-18 ENCOUNTER — Ambulatory Visit (INDEPENDENT_AMBULATORY_CARE_PROVIDER_SITE_OTHER): Payer: PPO | Admitting: Internal Medicine

## 2015-11-18 DIAGNOSIS — R7989 Other specified abnormal findings of blood chemistry: Secondary | ICD-10-CM | POA: Diagnosis not present

## 2015-11-18 DIAGNOSIS — R945 Abnormal results of liver function studies: Secondary | ICD-10-CM

## 2015-11-18 DIAGNOSIS — E7800 Pure hypercholesterolemia, unspecified: Secondary | ICD-10-CM

## 2015-11-18 DIAGNOSIS — K219 Gastro-esophageal reflux disease without esophagitis: Secondary | ICD-10-CM | POA: Diagnosis not present

## 2015-11-18 DIAGNOSIS — I10 Essential (primary) hypertension: Secondary | ICD-10-CM

## 2015-11-18 DIAGNOSIS — E871 Hypo-osmolality and hyponatremia: Secondary | ICD-10-CM

## 2015-11-18 DIAGNOSIS — Z91048 Other nonmedicinal substance allergy status: Secondary | ICD-10-CM | POA: Diagnosis not present

## 2015-11-18 DIAGNOSIS — R5383 Other fatigue: Secondary | ICD-10-CM

## 2015-11-18 DIAGNOSIS — R42 Dizziness and giddiness: Secondary | ICD-10-CM

## 2015-11-18 DIAGNOSIS — Z9109 Other allergy status, other than to drugs and biological substances: Secondary | ICD-10-CM

## 2015-11-18 MED ORDER — AMLODIPINE BESYLATE 2.5 MG PO TABS: 2.5000 mg | ORAL_TABLET | Freq: Every day | ORAL | 1 refills | Status: DC

## 2015-11-18 NOTE — Progress Notes (Signed)
Pre-visit discussion using our clinic review tool. No additional management support is needed unless otherwise documented below in the visit note.  

## 2015-11-18 NOTE — Progress Notes (Signed)
Patient ID: Janet Moran, female   DOB: 1926-02-04, 82 y.o.   MRN: [id number]   Subjective:    Patient ID: Janet Moran, female    DOB: 12/15/1925, 82 y.o.   MRN: [id number]  HPI  Patient here for a scheduled follow up.  She reports she is doing relatively well.  Does report some fatigue.  Occasional light headedness.  Reports noticing when stands up.  Goes away quickly.  No room spinning.  No chest pain.  No sob.  No increased heart rate or palpitations.  No acid reflux.  No abdominal pain or cramping.  Bowels stable.  Is planning to go to the gym. Eating well.     Past Medical History:  Diagnosis Date  . Arthritis   . Chicken pox   . GERD (gastroesophageal reflux disease)   . Glaucoma   . Hypercholesterolemia   . Hypertension    Past Surgical History:  Procedure Laterality Date  . ABDOMINAL HYSTERECTOMY  1976  . TONSILLECTOMY AND ADENOIDECTOMY  1935   Family History  Problem Relation Age of Onset  . Lung cancer Sister     died 5  . Stroke Mother   . Stroke Father   . Diabetes Maternal Grandmother   . Diabetes Maternal Grandfather   . Breast cancer Maternal Aunt     26's   Social History   Social History  . Marital status: Married    Spouse name: N/A  . Number of children: N/A  . Years of education: N/A   Social History Main Topics  . Smoking status: Never Smoker  . Smokeless tobacco: Never Used  . Alcohol use No  . Drug use: No  . Sexual activity: Not Asked   Other Topics Concern  . None   Social History Narrative  . None    Outpatient Encounter Prescriptions as of 11/18/2015  Medication Sig  . aspirin EC 81 MG tablet Take 81 mg by mouth daily.  . calcium carbonate (OS-CAL) 600 MG TABS Take 600 mg by mouth 2 (two) times daily with a meal. Take 1 tablet by mouth twice daily with food  . latanoprost (XALATAN) 0.005 % ophthalmic solution Place 1 drop into both eyes at bedtime.  Marland Kitchen losartan (COZAAR) 50 MG tablet TAKE ONE (1) TABLET BY MOUTH EVERY DAY    . metoprolol succinate (TOPROL XL) 25 MG 24 hr tablet Take 1 tablet (25 mg total) by mouth daily.  . naproxen sodium (ANAPROX) 220 MG tablet Take 220 mg by mouth 2 (two) times daily with a meal.  . omeprazole (PRILOSEC) 20 MG capsule Take 20 mg by mouth daily.  . simvastatin (ZOCOR) 20 MG tablet TAKE ONE TABLET BY MOUTH EVERY EVENING.  Marland Kitchen triamcinolone cream (KENALOG) 0.1 % Apply 1 application topically 2 (two) times daily.  . [DISCONTINUED] amLODipine (NORVASC) 5 MG tablet TAKE ONE (1) TABLET EACH DAY  . [DISCONTINUED] dorzolamide-timolol (COSOPT) 22.3-6.8 MG/ML ophthalmic solution   . [DISCONTINUED] timolol (BETIMOL) 0.25 % ophthalmic solution Place 1-2 drops into the right eye 2 (two) times daily.   Marland Kitchen amLODipine (NORVASC) 2.5 MG tablet Take 1 tablet (2.5 mg total) by mouth daily.  . [DISCONTINUED] amoxicillin (AMOXIL) 875 MG tablet Reported on 05/31/2015   Facility-Administered Encounter Medications as of 11/18/2015  Medication  . cyanocobalamin ((VITAMIN B-12)) injection 1,000 mcg    Review of Systems  Constitutional: Positive for fatigue. Negative for appetite change and unexpected weight change.  HENT: Negative for congestion and sinus pressure.  Respiratory: Negative for cough, chest tightness and shortness of breath.   Cardiovascular: Negative for chest pain, palpitations and leg swelling.  Gastrointestinal: Negative for abdominal pain, diarrhea, nausea and vomiting.  Genitourinary: Negative for difficulty urinating and dysuria.  Musculoskeletal: Negative for joint swelling and myalgias.  Skin: Negative for color change and rash.  Neurological: Positive for light-headedness. Negative for headaches.  Psychiatric/Behavioral: Negative for agitation and dysphoric mood.       Objective:     Blood pressure rechecked by me:  128-130/78  Physical Exam  Constitutional: She appears well-developed and well-nourished. No distress.  HENT:  Nose: Nose normal.  Mouth/Throat: Oropharynx  is clear and moist.  Neck: Neck supple. No thyromegaly present.  Cardiovascular: Normal rate and regular rhythm.   Pulmonary/Chest: Breath sounds normal. No respiratory distress. She has no wheezes.  Abdominal: Soft. Bowel sounds are normal. There is no tenderness.  Musculoskeletal: She exhibits no edema or tenderness.  Lymphadenopathy:    She has no cervical adenopathy.  Skin: No rash noted. No erythema.  Psychiatric: She has a normal mood and affect. Her behavior is normal.    BP (!) 146/58   Pulse 64   Temp 98.5 F (36.9 C) (Oral)   Resp 18   Ht 5\' 5"  (1.651 m)   Wt 150 lb 6 oz (68.2 kg)   SpO2 98%   BMI 25.02 kg/m  Wt Readings from Last 3 Encounters:  11/18/15 150 lb 6 oz (68.2 kg)  07/19/15 151 lb 4 oz (68.6 kg)  05/31/15 153 lb 8 oz (69.6 kg)     Lab Results  Component Value Date   WBC 5.6 11/15/2015   HGB 14.8 11/15/2015   HCT 43.4 11/15/2015   PLT 207.0 11/15/2015   GLUCOSE 93 11/15/2015   CHOL 181 11/15/2015   TRIG 127.0 11/15/2015   HDL 63.20 11/15/2015   LDLCALC 93 11/15/2015   ALT 16 11/15/2015   AST 16 11/15/2015   NA 138 11/15/2015   K 3.8 11/15/2015   CL 101 11/15/2015   CREATININE 0.67 11/15/2015   BUN 12 11/15/2015   CO2 28 11/15/2015   TSH 3.20 11/15/2015    Dg Bone Density  Result Date: 05/03/2015 EXAM: DUAL X-RAY ABSORPTIOMETRY (DXA) FOR BONE MINERAL DENSITY IMPRESSION: Dear Dr. Lorin Picket, Your patient Janet Moran completed a BMD test on 05/03/2015 using the Lunar iDXA DXA System (analysis version: 14.10) manufactured by [employer]. The following summarizes the results of our evaluation. PATIENT BIOGRAPHICAL: Name: Janet Moran Patient ID: [id number] Birth Date: 1926-02-24 Height: 65.5 in. Gender: Female Exam Date: 05/03/2015 Weight: 150.0 lbs. Indications: Advanced Age, Caucasian, Height Loss, History of Fracture (Adult), Hysterectomy Fractures: Right foot Treatments: CALCIUM VIT D ASSESSMENT: The BMD measured at Forearm Radius 33% is  0.569 g/cm2 with a T-score of -3.5. This patient is considered osteoporotic according to World Health Organization [facility]) criteria. Lumbar spine was not utilized due to scoliosis. Site Region Measured Measured WHO Young Adult BMD Date       Age      Classification T-score DualFemur Total Left 05/03/2015 87.9 Osteopenia -1.8 0.782 g/cm2 Left Forearm Radius 33% 05/03/2015 87.9 Osteoporosis -3.5 0.569 g/cm2 World Health Organization [facility]) criteria for post-menopausal, Caucasian Women: Normal:       T-score at or above -1 SD Osteopenia:   T-score between -1 and -2.5 SD Osteoporosis: T-score at or below -2.5 SD RECOMMENDATIONS: National Osteoporosis Foundation recommends that FDA-approved medical therapies be considered in postmenopausal women and men age 65 or  older with a: 1. Hip or vertebral (clinical or morphometric) fracture. 2. T-score of < -2.5 at the spine or hip. 3. Ten-year fracture probability by FRAX of 3% or greater for hip fracture or 20% or greater for major osteoporotic fracture. All treatment decisions require clinical judgment and consideration of individual patient factors, including patient preferences, co-morbidities, previous drug use, risk factors not captured in the FRAX model (e.g. falls, vitamin D deficiency, increased bone turnover, interval significant decline in bone density) and possible under - or over-estimation of fracture risk by FRAX. All patients should ensure an adequate intake of dietary calcium (1200 mg/d) and vitamin D (800 IU daily) unless contraindicated. FOLLOW-UP: People with diagnosed cases of osteoporosis or at high risk for fracture should have regular bone mineral density tests. For patients eligible for Medicare, routine testing is allowed once every 2 years. The testing frequency can be increased to one year for patients who have rapidly progressing disease, those who are receiving or discontinuing medical therapy to restore bone mass, or have additional risk factors. I  have reviewed this report, and agree with the above findings. [facility] Radiology Electronically Signed   By: Charlett Nose M.D.   On: 05/03/2015 13:08   Mm Digital Screening Bilateral  Result Date: 05/03/2015 CLINICAL DATA:  Screening. EXAM: DIGITAL SCREENING BILATERAL MAMMOGRAM WITH CAD COMPARISON:  Previous exam(s). ACR Breast Density Category b: There are scattered areas of fibroglandular density. FINDINGS: There are no findings suspicious for malignancy. Images were processed with CAD. IMPRESSION: No mammographic evidence of malignancy. A result letter of this screening mammogram will be mailed directly to the patient. RECOMMENDATION: Screening mammogram in one year if felt to be clinically indicated. (Code:SM-B-01Y) BI-RADS CATEGORY  1: Negative. Electronically Signed   By: Rolla Plate M.D.   On: 05/03/2015 13:02       Assessment & Plan:   Problem List Items Addressed This Visit    Abnormal liver function test    Last liver panel wnl.  Recheck liver panel with next labs.        Environmental allergies    Under control.  Stable.       Fatigue    Reports increased fatigue as outlined.  Recent labs wnl.  Reviewed recent labs.  Discussed with her today.  States her blood pressure at home averaging 120s.  Will decrease amlodipine to 2.5mg  q day.  Follow pressures.  See if energy improves.  Follow closely.  Get her back in soon to reassess.        GERD (gastroesophageal reflux disease)    Upper symptoms controlled.  On omeprazole.       Hypercholesterolemia    On simvastatin.  Low cholesterol diet.  Exercise as tolerated.   Lab Results  Component Value Date   CHOL 181 11/15/2015   HDL 63.20 11/15/2015   LDLCALC 93 11/15/2015   TRIG 127.0 11/15/2015   CHOLHDL 3 11/15/2015        Relevant Medications   amLODipine (NORVASC) 2.5 MG tablet   Hypertension    Adjust amlodipine as outlined.  Follow pressures.  Follow metabolic panel.       Relevant Medications   amLODipine  (NORVASC) 2.5 MG tablet   Hyponatremia    Last sodium check wnl.       Light headedness    Still with some light headedness.  No significant drop in bp noted on exam with position changes.  No headache.  Light headedness quickly resolves.  Discussed the need  for slow position changes and movements.  Decrease amlodipine as outlined.  Follow closely.  Get her back in soon to reassess.         Other Visit Diagnoses   None.      Dale DurhamSCOTT, Rashea Hoskie, MD

## 2015-11-18 NOTE — Patient Instructions (Signed)
Change amlodipine to 2.5mg  per day.

## 2015-11-20 ENCOUNTER — Encounter: Payer: Self-pay | Admitting: Internal Medicine

## 2015-11-20 NOTE — Assessment & Plan Note (Signed)
Adjust amlodipine as outlined.  Follow pressures.  Follow metabolic panel.

## 2015-11-20 NOTE — Assessment & Plan Note (Signed)
Under control.  Stable.

## 2015-11-20 NOTE — Assessment & Plan Note (Signed)
Upper symptoms controlled.  On omeprazole.   

## 2015-11-20 NOTE — Assessment & Plan Note (Signed)
Reports increased fatigue as outlined.  Recent labs wnl.  Reviewed recent labs.  Discussed with her today.  States her blood pressure at home averaging 120s.  Will decrease amlodipine to 2.5mg  q day.  Follow pressures.  See if energy improves.  Follow closely.  Get her back in soon to reassess.

## 2015-11-20 NOTE — Assessment & Plan Note (Signed)
Last sodium check wnl.  

## 2015-11-20 NOTE — Assessment & Plan Note (Signed)
Still with some light headedness.  No significant drop in bp noted on exam with position changes.  No headache.  Light headedness quickly resolves.  Discussed the need for slow position changes and movements.  Decrease amlodipine as outlined.  Follow closely.  Get her back in soon to reassess.

## 2015-11-20 NOTE — Assessment & Plan Note (Signed)
Last liver panel wnl.  Recheck liver panel with next labs.

## 2015-11-20 NOTE — Assessment & Plan Note (Signed)
On simvastatin.  Low cholesterol diet.  Exercise as tolerated.   Lab Results  Component Value Date   CHOL 181 11/15/2015   HDL 63.20 11/15/2015   LDLCALC 93 11/15/2015   TRIG 127.0 11/15/2015   CHOLHDL 3 11/15/2015

## 2015-11-28 ENCOUNTER — Ambulatory Visit (INDEPENDENT_AMBULATORY_CARE_PROVIDER_SITE_OTHER): Payer: PPO | Admitting: Podiatry

## 2015-11-28 ENCOUNTER — Encounter: Payer: Self-pay | Admitting: Podiatry

## 2015-11-28 DIAGNOSIS — B351 Tinea unguium: Secondary | ICD-10-CM

## 2015-11-28 DIAGNOSIS — M79676 Pain in unspecified toe(s): Secondary | ICD-10-CM

## 2015-11-28 NOTE — Progress Notes (Signed)
She presents today for follow-up of her painful elongated toenails. She also goes on to tell me about her granddaughter having surgery to her feet and legs.  Objective: Vital signs are stable alert and oriented 3. Pulses are palpable. Toenails are long thick yellow dystrophic onychomycotic.  Assessment: Pain in limb secondary to onychomycosis.  Plan: Debridement of all reactive hyperkeratoses and nails for her today remember to ask her how her granddaughter is doing after surgery.

## 2015-12-06 ENCOUNTER — Other Ambulatory Visit: Payer: Self-pay | Admitting: Internal Medicine

## 2016-01-04 ENCOUNTER — Encounter: Payer: Self-pay | Admitting: Internal Medicine

## 2016-01-04 ENCOUNTER — Ambulatory Visit (INDEPENDENT_AMBULATORY_CARE_PROVIDER_SITE_OTHER): Payer: PPO | Admitting: Internal Medicine

## 2016-01-04 DIAGNOSIS — I10 Essential (primary) hypertension: Secondary | ICD-10-CM | POA: Diagnosis not present

## 2016-01-04 DIAGNOSIS — R945 Abnormal results of liver function studies: Secondary | ICD-10-CM

## 2016-01-04 DIAGNOSIS — M810 Age-related osteoporosis without current pathological fracture: Secondary | ICD-10-CM

## 2016-01-04 DIAGNOSIS — E7800 Pure hypercholesterolemia, unspecified: Secondary | ICD-10-CM | POA: Diagnosis not present

## 2016-01-04 DIAGNOSIS — K219 Gastro-esophageal reflux disease without esophagitis: Secondary | ICD-10-CM | POA: Diagnosis not present

## 2016-01-04 DIAGNOSIS — Z23 Encounter for immunization: Secondary | ICD-10-CM | POA: Diagnosis not present

## 2016-01-04 DIAGNOSIS — R42 Dizziness and giddiness: Secondary | ICD-10-CM

## 2016-01-04 DIAGNOSIS — R7989 Other specified abnormal findings of blood chemistry: Secondary | ICD-10-CM

## 2016-01-04 DIAGNOSIS — Z9109 Other allergy status, other than to drugs and biological substances: Secondary | ICD-10-CM

## 2016-01-04 MED ORDER — LOSARTAN POTASSIUM 50 MG PO TABS: ORAL_TABLET | ORAL | 5 refills | Status: DC

## 2016-01-04 MED ORDER — AMLODIPINE BESYLATE 2.5 MG PO TABS: 2.5000 mg | ORAL_TABLET | Freq: Every day | ORAL | 5 refills | Status: DC

## 2016-01-04 NOTE — Progress Notes (Signed)
Pre visit review using our clinic review tool, if applicable. No additional management support is needed unless otherwise documented below in the visit note. 

## 2016-01-04 NOTE — Progress Notes (Signed)
Patient ID: Janet Moran, female   DOB: Sep 05, 1925, 82 y.o.   MRN: [id number]   Subjective:    Patient ID: Janet Moran, female    DOB: 1926-03-07, 82 y.o.   MRN: [id number]  HPI  Patient here for a scheduled follow up.  Last visit was having light headedness.  Amlodipine was adjusted to 2.5mg  q day.  States feels better.  Light headedness is better.  She does still feel some fatigue, but overall feels better.  She is going to the [facility].  Riding a bike.  Has worked up to 4 miles.  Still walking at the mall.  No chest pain.  No sob.  No acid reflux.  Bowels stable.     Past Medical History:  Diagnosis Date  . Arthritis   . Chicken pox   . GERD (gastroesophageal reflux disease)   . Glaucoma   . Hypercholesterolemia   . Hypertension    Past Surgical History:  Procedure Laterality Date  . ABDOMINAL HYSTERECTOMY  1976  . TONSILLECTOMY AND ADENOIDECTOMY  1935   Family History  Problem Relation Age of Onset  . Lung cancer Sister     died 13  . Stroke Mother   . Stroke Father   . Diabetes Maternal Grandmother   . Diabetes Maternal Grandfather   . Breast cancer Maternal Aunt     76's   Social History   Social History  . Marital status: Married    Spouse name: N/A  . Number of children: N/A  . Years of education: N/A   Social History Main Topics  . Smoking status: Never Smoker  . Smokeless tobacco: Never Used  . Alcohol use No  . Drug use: No  . Sexual activity: Not Asked   Other Topics Concern  . None   Social History Narrative  . None    Outpatient Encounter Prescriptions as of 01/04/2016  Medication Sig  . amLODipine (NORVASC) 2.5 MG tablet Take 1 tablet (2.5 mg total) by mouth daily.  Marland Kitchen aspirin EC 81 MG tablet Take 81 mg by mouth daily.  . calcium carbonate (OS-CAL) 600 MG TABS Take 600 mg by mouth 2 (two) times daily with a meal. Take 1 tablet by mouth twice daily with food  . latanoprost (XALATAN) 0.005 % ophthalmic solution Place 1 drop into both eyes at  bedtime.  Marland Kitchen losartan (COZAAR) 50 MG tablet TAKE ONE (1) TABLET EACH DAY  . metoprolol succinate (TOPROL XL) 25 MG 24 hr tablet Take 1 tablet (25 mg total) by mouth daily.  . naproxen sodium (ANAPROX) 220 MG tablet Take 220 mg by mouth 2 (two) times daily with a meal.  . omeprazole (PRILOSEC) 20 MG capsule Take 20 mg by mouth daily.  . simvastatin (ZOCOR) 20 MG tablet TAKE ONE TABLET BY MOUTH EVERY EVENING.  Marland Kitchen triamcinolone cream (KENALOG) 0.1 % Apply 1 application topically 2 (two) times daily.  . [DISCONTINUED] amLODipine (NORVASC) 2.5 MG tablet Take 1 tablet (2.5 mg total) by mouth daily.  . [DISCONTINUED] losartan (COZAAR) 50 MG tablet TAKE ONE (1) TABLET EACH DAY   Facility-Administered Encounter Medications as of 01/04/2016  Medication  . cyanocobalamin ((VITAMIN B-12)) injection 1,000 mcg    Review of Systems  Constitutional: Positive for fatigue. Negative for appetite change and unexpected weight change.  HENT: Negative for congestion and sinus pressure.   Respiratory: Negative for cough, chest tightness and shortness of breath.   Cardiovascular: Negative for chest pain, palpitations and leg swelling.  Gastrointestinal:  Negative for abdominal pain, diarrhea and nausea.  Genitourinary: Negative for difficulty urinating and dysuria.  Musculoskeletal: Negative for joint swelling and myalgias.  Skin: Negative for color change and rash.  Neurological: Negative for dizziness, light-headedness and headaches.  Psychiatric/Behavioral: Negative for agitation and dysphoric mood.       Objective:     Blood pressure rechecked by me:  130/68  Physical Exam  Constitutional: She appears well-developed and well-nourished. No distress.  HENT:  Nose: Nose normal.  Mouth/Throat: Oropharynx is clear and moist.  Neck: Neck supple. No thyromegaly present.  Cardiovascular: Normal rate and regular rhythm.   Pulmonary/Chest: Breath sounds normal. No respiratory distress. She has no wheezes.    Abdominal: Soft. Bowel sounds are normal. There is no tenderness.  Musculoskeletal: She exhibits no edema or tenderness.  Lymphadenopathy:    She has no cervical adenopathy.  Skin: No rash noted. No erythema.  Psychiatric: She has a normal mood and affect. Her behavior is normal.    BP 120/80   Pulse 65   Temp 98 F (36.7 C) (Oral)   Ht 5\' 5"  (1.651 m)   Wt 149 lb 12.8 oz (67.9 kg)   SpO2 98%   BMI 24.93 kg/m  Wt Readings from Last 3 Encounters:  01/04/16 149 lb 12.8 oz (67.9 kg)  11/18/15 150 lb 6 oz (68.2 kg)  07/19/15 151 lb 4 oz (68.6 kg)     Lab Results  Component Value Date   WBC 5.6 11/15/2015   HGB 14.8 11/15/2015   HCT 43.4 11/15/2015   PLT 207.0 11/15/2015   GLUCOSE 93 11/15/2015   CHOL 181 11/15/2015   TRIG 127.0 11/15/2015   HDL 63.20 11/15/2015   LDLCALC 93 11/15/2015   ALT 16 11/15/2015   AST 16 11/15/2015   NA 138 11/15/2015   K 3.8 11/15/2015   CL 101 11/15/2015   CREATININE 0.67 11/15/2015   BUN 12 11/15/2015   CO2 28 11/15/2015   TSH 3.20 11/15/2015    Dg Bone Density  Result Date: 05/03/2015 EXAM: DUAL X-RAY ABSORPTIOMETRY (DXA) FOR BONE MINERAL DENSITY IMPRESSION: Dear Dr. Lorin Moran, Your patient Janet Moran completed a BMD test on 05/03/2015 using the Lunar iDXA DXA System (analysis version: 14.10) manufactured by [employer]. The following summarizes the results of our evaluation. PATIENT BIOGRAPHICAL: Name: Janet Moran Patient ID: [id number] Birth Date: December 12, 1925 Height: 65.5 in. Gender: Female Exam Date: 05/03/2015 Weight: 150.0 lbs. Indications: Advanced Age, Caucasian, Height Loss, History of Fracture (Adult), Hysterectomy Fractures: Right foot Treatments: CALCIUM VIT D ASSESSMENT: The BMD measured at Forearm Radius 33% is 0.569 g/cm2 with a T-score of -3.5. This patient is considered osteoporotic according to World Health Organization [facility]) criteria. Lumbar spine was not utilized due to scoliosis. Site Region Measured Measured WHO  Young Adult BMD Date       Age      Classification T-score DualFemur Total Left 05/03/2015 87.9 Osteopenia -1.8 0.782 g/cm2 Left Forearm Radius 33% 05/03/2015 87.9 Osteoporosis -3.5 0.569 g/cm2 World Health Organization [facility]) criteria for post-menopausal, Caucasian Women: Normal:       T-score at or above -1 SD Osteopenia:   T-score between -1 and -2.5 SD Osteoporosis: T-score at or below -2.5 SD RECOMMENDATIONS: National Osteoporosis Foundation recommends that FDA-approved medical therapies be considered in postmenopausal women and men age 32 or older with a: 1. Hip or vertebral (clinical or morphometric) fracture. 2. T-score of < -2.5 at the spine or hip. 3. Ten-year fracture probability by FRAX of  3% or greater for hip fracture or 20% or greater for major osteoporotic fracture. All treatment decisions require clinical judgment and consideration of individual patient factors, including patient preferences, co-morbidities, previous drug use, risk factors not captured in the FRAX model (e.g. falls, vitamin D deficiency, increased bone turnover, interval significant decline in bone density) and possible under - or over-estimation of fracture risk by FRAX. All patients should ensure an adequate intake of dietary calcium (1200 mg/d) and vitamin D (800 IU daily) unless contraindicated. FOLLOW-UP: People with diagnosed cases of osteoporosis or at high risk for fracture should have regular bone mineral density tests. For patients eligible for Medicare, routine testing is allowed once every 2 years. The testing frequency can be increased to one year for patients who have rapidly progressing disease, those who are receiving or discontinuing medical therapy to restore bone mass, or have additional risk factors. I have reviewed this report, and agree with the above findings. [facility] Electronically Signed   By: Charlett NoseKevin  Dover M.D.   On: 05/03/2015 13:08   Mm Digital Screening Bilateral  Result Date:  05/03/2015 CLINICAL DATA:  Screening. EXAM: DIGITAL SCREENING BILATERAL MAMMOGRAM WITH CAD COMPARISON:  Previous exam(s). ACR Breast Density Category b: There are scattered areas of fibroglandular density. FINDINGS: There are no findings suspicious for malignancy. Images were processed with CAD. IMPRESSION: No mammographic evidence of malignancy. A result letter of this screening mammogram will be mailed directly to the patient. RECOMMENDATION: Screening mammogram in one year if felt to be clinically indicated. (Code:SM-B-01Y) BI-RADS CATEGORY  1: Negative. Electronically Signed   By: Rolla Plateandolph  Jackson M.D.   On: 05/03/2015 13:02       Assessment & Plan:   Problem List Items Addressed This Visit    Abnormal liver function test    Liver panel checked 11/15/15 - wnl.        Environmental allergies    Stable.        GERD (gastroesophageal reflux disease)    On prilosec.  No acid reflux.  Follow.       Hypercholesterolemia    On simvastatin.  Low cholesterol diet and exercise.  Follow lipid panel and liver function tests.        Relevant Medications   losartan (COZAAR) 50 MG tablet   amLODipine (NORVASC) 2.5 MG tablet   Other Relevant Orders   Lipid panel   Hepatic function panel   Hypertension    Blood pressure as outlined.  Doing well.  Follow.        Relevant Medications   losartan (COZAAR) 50 MG tablet   amLODipine (NORVASC) 2.5 MG tablet   Other Relevant Orders   Basic metabolic panel   Light headedness    Better after adjusting amlodipine.  Follow.        Osteoporosis    Have discussed treatment options.  She prefers not to pursue any treatment at this time.  Continue calcium, vitamin D and weight bearing exercise.  Follow.        Other Visit Diagnoses    Encounter for immunization       Relevant Medications   losartan (COZAAR) 50 MG tablet   amLODipine (NORVASC) 2.5 MG tablet   Other Relevant Orders   Flu vaccine HIGH DOSE PF (Completed)       Dale DurhamSCOTT,  Jaycion Treml, MD

## 2016-01-15 ENCOUNTER — Encounter: Payer: Self-pay | Admitting: Internal Medicine

## 2016-01-15 NOTE — Assessment & Plan Note (Signed)
Stable

## 2016-01-15 NOTE — Assessment & Plan Note (Signed)
Have discussed treatment options.  She prefers not to pursue any treatment at this time.  Continue calcium, vitamin D and weight bearing exercise.  Follow.

## 2016-01-15 NOTE — Assessment & Plan Note (Signed)
On prilosec.  No acid reflux.  Follow.

## 2016-01-15 NOTE — Assessment & Plan Note (Signed)
On simvastatin.  Low cholesterol diet and exercise.  Follow lipid panel and liver function tests.   

## 2016-01-15 NOTE — Assessment & Plan Note (Signed)
Liver panel checked 11/15/15 - wnl.

## 2016-01-15 NOTE — Assessment & Plan Note (Signed)
Better after adjusting amlodipine.  Follow.

## 2016-01-15 NOTE — Assessment & Plan Note (Signed)
Blood pressure as outlined.  Doing well.  Follow.   

## 2016-02-27 ENCOUNTER — Ambulatory Visit: Payer: PPO | Admitting: Podiatry

## 2016-02-28 DIAGNOSIS — H40003 Preglaucoma, unspecified, bilateral: Secondary | ICD-10-CM | POA: Diagnosis not present

## 2016-03-05 ENCOUNTER — Encounter: Payer: Self-pay | Admitting: Podiatry

## 2016-03-05 ENCOUNTER — Ambulatory Visit (INDEPENDENT_AMBULATORY_CARE_PROVIDER_SITE_OTHER): Payer: PPO | Admitting: Podiatry

## 2016-03-05 VITALS — Ht 65.0 in | Wt 149.0 lb

## 2016-03-05 DIAGNOSIS — B351 Tinea unguium: Secondary | ICD-10-CM | POA: Diagnosis not present

## 2016-03-05 DIAGNOSIS — M79676 Pain in unspecified toe(s): Secondary | ICD-10-CM

## 2016-03-05 NOTE — Progress Notes (Signed)
Complaint:  Visit Type: Patient returns to my office for continued preventative foot care services. Complaint: Patient states" my nails have grown long and thick and become painful to walk and wear shoes" . The patient presents for preventative foot care services. No changes to ROS  Podiatric Exam: Vascular: dorsalis pedis and posterior tibial pulses are palpable bilateral. Capillary return is immediate. Temperature gradient is WNL. Skin turgor WNL  Sensorium: Normal Semmes Weinstein monofilament test. Normal tactile sensation bilaterally. Nail Exam: Pt has thick disfigured discolored nails with subungual debris noted bilateral entire nail hallux through fifth toenails Ulcer Exam: There is no evidence of ulcer or pre-ulcerative changes or infection. Orthopedic Exam: Muscle tone and strength are WNL. No limitations in general ROM. No crepitus or effusions noted. Foot type and digits show no abnormalities. Bony prominences are unremarkable. Skin: No Porokeratosis. No infection or ulcers  Diagnosis:  Onychomycosis, , Pain in right toe, pain in left toes  Treatment & Plan Procedures and Treatment: Consent by patient was obtained for treatment procedures. The patient understood the discussion of treatment and procedures well. All questions were answered thoroughly reviewed. Debridement of mycotic and hypertrophic toenails, 1 through 5 bilateral and clearing of subungual debris. No ulceration, no infection noted.  Return Visit-Office Procedure: Patient instructed to return to the office for a follow up visit 3 months   for continued evaluation and treatment.    Kailee Essman DPM 

## 2016-03-07 ENCOUNTER — Other Ambulatory Visit: Payer: Self-pay | Admitting: Internal Medicine

## 2016-04-05 ENCOUNTER — Telehealth: Payer: Self-pay | Admitting: Surgical

## 2016-04-05 NOTE — Telephone Encounter (Signed)
Spoke with daughter Janet Moran after she sent a My Chart message. I have scheduled Janet Moran to come see Dr. Lorin PicketScott 04/06/16

## 2016-04-06 ENCOUNTER — Ambulatory Visit (INDEPENDENT_AMBULATORY_CARE_PROVIDER_SITE_OTHER): Payer: PPO | Admitting: Internal Medicine

## 2016-04-06 ENCOUNTER — Encounter: Payer: Self-pay | Admitting: Internal Medicine

## 2016-04-06 DIAGNOSIS — I10 Essential (primary) hypertension: Secondary | ICD-10-CM

## 2016-04-06 DIAGNOSIS — J069 Acute upper respiratory infection, unspecified: Secondary | ICD-10-CM

## 2016-04-06 MED ORDER — AZITHROMYCIN 250 MG PO TABS: ORAL_TABLET | ORAL | 0 refills | Status: DC

## 2016-04-06 NOTE — Progress Notes (Signed)
Patient ID: Janet Moran, female   DOB: 09-Jun-1925, 82 y.o.   MRN: [id number]   Subjective:    Patient ID: Janet Moran, female    DOB: 09-Jun-1925, 82 y.o.   MRN: [id number]  HPI  Patient here as a work in with concerns regarding increased cough and congestion.  She is acconmpanied by her daughter.  History obtained from both of them.  Husband has been diagnosed with bronchitis.  She reports that starting this week, she has noticed increased cough and congestion.  Cough has increased as the week has progressed.  Tmax 100.  Increased post nasal drainage.  No sore throat.  No chest congestion.  No vomiting, nausea or diarrhea.  Has taken alleve, nasacort and nyquil.     Past Medical History:  Diagnosis Date  . Arthritis   . Chicken pox   . GERD (gastroesophageal reflux disease)   . Glaucoma   . Hypercholesterolemia   . Hypertension    Past Surgical History:  Procedure Laterality Date  . ABDOMINAL HYSTERECTOMY  1976  . TONSILLECTOMY AND ADENOIDECTOMY  1935   Family History  Problem Relation Age of Onset  . Lung cancer Sister     died [id number]  . Stroke Mother   . Stroke Father   . Diabetes Maternal Grandmother   . Diabetes Maternal Grandfather   . Breast cancer Maternal Aunt     2650's   Social History   Social History  . Marital status: Married    Spouse name: N/A  . Number of children: N/A  . Years of education: N/A   Social History Main Topics  . Smoking status: Never Smoker  . Smokeless tobacco: Never Used  . Alcohol use No  . Drug use: No  . Sexual activity: Not Asked   Other Topics Concern  . None   Social History Narrative  . None    Outpatient Encounter Prescriptions as of 04/06/2016  Medication Sig  . amLODipine (NORVASC) 2.5 MG tablet Take 1 tablet (2.5 mg total) by mouth daily.  Marland Kitchen. aspirin EC 81 MG tablet Take 81 mg by mouth daily.  . calcium carbonate (OS-CAL) 600 MG TABS Take 600 mg by mouth 2 (two) times daily with a meal. Take 1 tablet by mouth  twice daily with food  . latanoprost (XALATAN) 0.005 % ophthalmic solution Place 1 drop into both eyes at bedtime.  Marland Kitchen. losartan (COZAAR) 50 MG tablet TAKE ONE (1) TABLET EACH DAY  . metoprolol succinate (TOPROL XL) 25 MG 24 hr tablet Take 1 tablet (25 mg total) by mouth daily.  . naproxen sodium (ANAPROX) 220 MG tablet Take 220 mg by mouth 2 (two) times daily with a meal.  . omeprazole (PRILOSEC) 20 MG capsule Take 20 mg by mouth daily.  . simvastatin (ZOCOR) 20 MG tablet TAKE ONE TABLET EACH EVENING AS DIRECTED  . triamcinolone cream (KENALOG) 0.1 % Apply 1 application topically 2 (two) times daily.  Marland Kitchen. azithromycin (ZITHROMAX) 250 MG tablet Take 2 tablets x 1 day and then one tablet per day for four more days.   Facility-Administered Encounter Medications as of 04/06/2016  Medication  . cyanocobalamin ((VITAMIN B-12)) injection 1,000 mcg    Review of Systems  Constitutional: Negative for appetite change and unexpected weight change.  HENT: Positive for congestion and postnasal drip. Negative for sinus pressure.   Respiratory: Positive for cough. Negative for chest tightness and shortness of breath.   Cardiovascular: Negative for chest pain, palpitations and leg swelling.  Gastrointestinal: Negative for diarrhea, nausea and vomiting.  Skin: Negative for color change and rash.  Neurological: Negative for dizziness, light-headedness and headaches.       Objective:    Physical Exam  Constitutional: She appears well-developed and well-nourished. No distress.  HENT:  Mouth/Throat: Oropharynx is clear and moist.  Nares - slightly erythematous turbinates.  No significant tenderness to palpation over the sinuses.  TMs clear.    Eyes: Conjunctivae are normal. Right eye exhibits no discharge. Left eye exhibits no discharge.  Neck: Neck supple.  Cardiovascular: Normal rate and regular rhythm.   Pulmonary/Chest: Breath sounds normal. No respiratory distress. She has no wheezes.  Abdominal:  Soft. Bowel sounds are normal.  Lymphadenopathy:    She has no cervical adenopathy.    BP 126/64   Pulse 64   Temp 98.1 F (36.7 C) (Oral)   Wt 148 lb 9.6 oz (67.4 kg)   SpO2 97%   BMI 24.73 kg/m  Wt Readings from Last 3 Encounters:  04/06/16 148 lb 9.6 oz (67.4 kg)  03/05/16 149 lb (67.6 kg)  01/04/16 149 lb 12.8 oz (67.9 kg)     Lab Results  Component Value Date   WBC 5.6 11/15/2015   HGB 14.8 11/15/2015   HCT 43.4 11/15/2015   PLT 207.0 11/15/2015   GLUCOSE 93 11/15/2015   CHOL 181 11/15/2015   TRIG 127.0 11/15/2015   HDL 63.20 11/15/2015   LDLCALC 93 11/15/2015   ALT 16 11/15/2015   AST 16 11/15/2015   NA 138 11/15/2015   K 3.8 11/15/2015   CL 101 11/15/2015   CREATININE 0.67 11/15/2015   BUN 12 11/15/2015   CO2 28 11/15/2015   TSH 3.20 11/15/2015       Assessment & Plan:   Problem List Items Addressed This Visit    Hypertension    Blood pressure under good control.  Continue same medication regimen.  Follow pressures.  Follow metabolic panel.        URI (upper respiratory infection)    Symptoms and exam as outlined.  Treat with saline nasal spray and nasacort nasal spray as directed.  Mucinex/robitussin as directed.  Rest.  Fluids.  If symptoms worsen, azithromycin as directed.  Follow.        Relevant Medications   azithromycin (ZITHROMAX) 250 MG tablet       Dale DurhamSCOTT, Neema Fluegge, MD

## 2016-04-06 NOTE — Patient Instructions (Signed)
Saline nasal spray - flush nose at least 2-3x/day  nasacort nasal spray - continue as you are doing.    mucinex in the am and robitussin DM in the evening.    If no improvement, take the antibiotic.    If you take the antibiotic, then start the probiotic.  Take the probiotic daily while you are on the antibiotic and for two weeks after completing the antibiotic.    Examples of probiotics:  Align, florastor or culturelle.    If you take the antibiotic, hold the simvastatin for one week.

## 2016-04-06 NOTE — Progress Notes (Signed)
Pre visit review using our clinic review tool, if applicable. No additional management support is needed unless otherwise documented below in the visit note. 

## 2016-04-09 ENCOUNTER — Encounter: Payer: Self-pay | Admitting: Internal Medicine

## 2016-04-09 DIAGNOSIS — J069 Acute upper respiratory infection, unspecified: Secondary | ICD-10-CM | POA: Insufficient documentation

## 2016-04-09 NOTE — Assessment & Plan Note (Signed)
Symptoms and exam as outlined.  Treat with saline nasal spray and nasacort nasal spray as directed.  Mucinex/robitussin as directed.  Rest.  Fluids.  If symptoms worsen, azithromycin as directed.  Follow.

## 2016-04-09 NOTE — Assessment & Plan Note (Signed)
Blood pressure under good control.  Continue same medication regimen.  Follow pressures.  Follow metabolic panel.   

## 2016-04-13 ENCOUNTER — Other Ambulatory Visit: Payer: Self-pay

## 2016-04-17 ENCOUNTER — Ambulatory Visit (INDEPENDENT_AMBULATORY_CARE_PROVIDER_SITE_OTHER): Payer: PPO

## 2016-04-17 ENCOUNTER — Encounter: Payer: Self-pay | Admitting: Internal Medicine

## 2016-04-17 ENCOUNTER — Ambulatory Visit (INDEPENDENT_AMBULATORY_CARE_PROVIDER_SITE_OTHER): Payer: PPO | Admitting: Internal Medicine

## 2016-04-17 VITALS — BP 130/70 | HR 62 | Temp 97.8°F | Resp 16 | Ht 65.0 in | Wt 146.1 lb

## 2016-04-17 DIAGNOSIS — I700 Atherosclerosis of aorta: Secondary | ICD-10-CM | POA: Diagnosis not present

## 2016-04-17 DIAGNOSIS — K219 Gastro-esophageal reflux disease without esophagitis: Secondary | ICD-10-CM | POA: Diagnosis not present

## 2016-04-17 DIAGNOSIS — Z1231 Encounter for screening mammogram for malignant neoplasm of breast: Secondary | ICD-10-CM | POA: Diagnosis not present

## 2016-04-17 DIAGNOSIS — E7800 Pure hypercholesterolemia, unspecified: Secondary | ICD-10-CM

## 2016-04-17 DIAGNOSIS — Z0000 Encounter for general adult medical examination without abnormal findings: Secondary | ICD-10-CM | POA: Diagnosis not present

## 2016-04-17 DIAGNOSIS — R05 Cough: Secondary | ICD-10-CM

## 2016-04-17 DIAGNOSIS — R059 Cough, unspecified: Secondary | ICD-10-CM

## 2016-04-17 DIAGNOSIS — Z1239 Encounter for other screening for malignant neoplasm of breast: Secondary | ICD-10-CM

## 2016-04-17 DIAGNOSIS — S0990XA Unspecified injury of head, initial encounter: Secondary | ICD-10-CM

## 2016-04-17 DIAGNOSIS — I10 Essential (primary) hypertension: Secondary | ICD-10-CM

## 2016-04-17 NOTE — Progress Notes (Signed)
Pre-visit discussion using our clinic review tool. No additional management support is needed unless otherwise documented below in the visit note.  

## 2016-04-17 NOTE — Progress Notes (Signed)
Patient ID: Janet Moran, female   DOB: 1925-10-15, 83 y.o.   MRN: [id number]   Subjective:    Patient ID: Janet Moran, female    DOB: 1925/07/07, 83 y.o.   MRN: [id number]  HPI  Patient with past history of hypercholesterolemia and hypertension, here for a scheduled follow up.   States she is doing relatively well.  Still has some cough, but does feel better.  Never took the abx.  Some nasal congestion.  No sob.  No acid reflux.  No abdominal pain or cramping.  Bowels stable.  She was placing a crock pot on the shelf last week and it fell and hit her head.  Some soreness on the spot that hit, but denies any headache.  No dizziness.  No light headedness.  Thinking - normal.  Overall feels things are stable.    Past Medical History:  Diagnosis Date  . Arthritis   . Chicken pox   . GERD (gastroesophageal reflux disease)   . Glaucoma   . Hypercholesterolemia   . Hypertension    Past Surgical History:  Procedure Laterality Date  . ABDOMINAL HYSTERECTOMY  1976  . TONSILLECTOMY AND ADENOIDECTOMY  1935   Family History  Problem Relation Age of Onset  . Lung cancer Sister     died 60  . Stroke Mother   . Stroke Father   . Diabetes Maternal Grandmother   . Diabetes Maternal Grandfather   . Breast cancer Maternal Aunt     57's   Social History   Social History  . Marital status: Married    Spouse name: N/A  . Number of children: 5  . Years of education: N/A   Social History Main Topics  . Smoking status: Never Smoker  . Smokeless tobacco: Never Used  . Alcohol use No  . Drug use: No  . Sexual activity: Not Asked   Other Topics Concern  . None   Social History Narrative  . None    Outpatient Encounter Prescriptions as of 04/17/2016  Medication Sig  . amLODipine (NORVASC) 2.5 MG tablet Take 1 tablet (2.5 mg total) by mouth daily.  Marland Kitchen aspirin EC 81 MG tablet Take 81 mg by mouth daily.  . calcium carbonate (OS-CAL) 600 MG TABS Take 600 mg by mouth 2 (two) times  daily with a meal. Take 1 tablet by mouth twice daily with food  . latanoprost (XALATAN) 0.005 % ophthalmic solution Place 1 drop into both eyes at bedtime.  Marland Kitchen losartan (COZAAR) 50 MG tablet TAKE ONE (1) TABLET EACH DAY  . metoprolol succinate (TOPROL XL) 25 MG 24 hr tablet Take 1 tablet (25 mg total) by mouth daily.  . naproxen sodium (ANAPROX) 220 MG tablet Take 220 mg by mouth 2 (two) times daily with a meal.  . omeprazole (PRILOSEC) 20 MG capsule Take 20 mg by mouth daily.  . simvastatin (ZOCOR) 20 MG tablet TAKE ONE TABLET EACH EVENING AS DIRECTED  . triamcinolone cream (KENALOG) 0.1 % Apply 1 application topically 2 (two) times daily.  Marland Kitchen azithromycin (ZITHROMAX) 250 MG tablet Take 2 tablets x 1 day and then one tablet per day for four more days. (Patient not taking: Reported on 04/17/2016)   Facility-Administered Encounter Medications as of 04/17/2016  Medication  . cyanocobalamin ((VITAMIN B-12)) injection 1,000 mcg    Review of Systems  Constitutional: Negative for appetite change and unexpected weight change.  HENT: Positive for postnasal drip. Negative for congestion and sinus pressure.   Eyes:  Negative for pain and visual disturbance.  Respiratory: Positive for cough. Negative for chest tightness and shortness of breath.   Cardiovascular: Negative for chest pain, palpitations and leg swelling.  Gastrointestinal: Negative for abdominal pain, diarrhea, nausea and vomiting.  Genitourinary: Negative for difficulty urinating and dysuria.  Musculoskeletal: Negative for back pain and joint swelling.  Skin: Negative for color change and rash.  Neurological: Negative for dizziness, light-headedness and headaches.  Hematological: Negative for adenopathy. Does not bruise/bleed easily.  Psychiatric/Behavioral: Negative for agitation and dysphoric mood.       Objective:    Physical Exam  Constitutional: She appears well-developed and well-nourished. No distress.  HENT:  Nose: Nose  normal.  Mouth/Throat: Oropharynx is clear and moist.  Neck: Neck supple. No thyromegaly present.  Cardiovascular: Normal rate and regular rhythm.   Pulmonary/Chest: Breath sounds normal. No respiratory distress. She has no wheezes.  Abdominal: Soft. Bowel sounds are normal. There is no tenderness.  Musculoskeletal: She exhibits no edema or tenderness.  Lymphadenopathy:    She has no cervical adenopathy.  Neurological:  Minimal tenderness to palpation over anterior head.    Skin: No rash noted. No erythema.  Psychiatric: She has a normal mood and affect. Her behavior is normal.    BP 130/70   Pulse 62   Temp 97.8 F (36.6 C) (Oral)   Resp 16   Ht 5\' 5"  (1.651 m)   Wt 146 lb 2 oz (66.3 kg)   SpO2 97%   BMI 24.32 kg/m  Wt Readings from Last 3 Encounters:  04/17/16 146 lb 2 oz (66.3 kg)  04/06/16 148 lb 9.6 oz (67.4 kg)  03/05/16 149 lb (67.6 kg)     Lab Results  Component Value Date   WBC 5.6 11/15/2015   HGB 14.8 11/15/2015   HCT 43.4 11/15/2015   PLT 207.0 11/15/2015   GLUCOSE 93 11/15/2015   CHOL 181 11/15/2015   TRIG 127.0 11/15/2015   HDL 63.20 11/15/2015   LDLCALC 93 11/15/2015   ALT 16 11/15/2015   AST 16 11/15/2015   NA 138 11/15/2015   K 3.8 11/15/2015   CL 101 11/15/2015   CREATININE 0.67 11/15/2015   BUN 12 11/15/2015   CO2 28 11/15/2015   TSH 3.20 11/15/2015    Dg Bone Density  Result Date: 05/03/2015 EXAM: DUAL X-RAY ABSORPTIOMETRY (DXA) FOR BONE MINERAL DENSITY IMPRESSION: Dear Dr. Lorin PicketScott, Your patient Janet Moran completed a BMD test on 05/03/2015 using the Lunar iDXA DXA System (analysis version: 14.10) manufactured by [employer]. The following summarizes the results of our evaluation. PATIENT BIOGRAPHICAL: Name: Janet Moran, Janet Moran Patient ID: [id number] Birth Date: [birth year] Height: 65.5 in. Gender: Female Exam Date: 05/03/2015 Weight: 150.0 lbs. Indications: Advanced Age, Caucasian, Height Loss, History of Fracture (Adult), Hysterectomy  Fractures: Right foot Treatments: CALCIUM VIT D ASSESSMENT: The BMD measured at Forearm Radius 33% is 0.569 g/cm2 with a T-score of -3.5. This patient is considered osteoporotic according to World Health Organization [facility](WHO) criteria. Lumbar spine was not utilized due to scoliosis. Site Region Measured Measured WHO Young Adult BMD Date       Age      Classification T-score DualFemur Total Left 05/03/2015 87.9 Osteopenia -1.8 0.782 g/cm2 Left Forearm Radius 33% 05/03/2015 87.9 Osteoporosis -3.5 0.569 g/cm2 World Health Organization [facility](WHO) criteria for post-menopausal, Caucasian Women: Normal:       T-score at or above -1 SD Osteopenia:   T-score between -1 and -2.5 SD Osteoporosis: T-score at or below -2.5 SD  RECOMMENDATIONS: National Osteoporosis Foundation recommends that FDA-approved medical therapies be considered in postmenopausal women and men age 41 or older with a: 1. Hip or vertebral (clinical or morphometric) fracture. 2. T-score of < -2.5 at the spine or hip. 3. Ten-year fracture probability by FRAX of 3% or greater for hip fracture or 20% or greater for major osteoporotic fracture. All treatment decisions require clinical judgment and consideration of individual patient factors, including patient preferences, co-morbidities, previous drug use, risk factors not captured in the FRAX model (e.g. falls, vitamin D deficiency, increased bone turnover, interval significant decline in bone density) and possible under - or over-estimation of fracture risk by FRAX. All patients should ensure an adequate intake of dietary calcium (1200 mg/d) and vitamin D (800 IU daily) unless contraindicated. FOLLOW-UP: People with diagnosed cases of osteoporosis or at high risk for fracture should have regular bone mineral density tests. For patients eligible for Medicare, routine testing is allowed once every 2 years. The testing frequency can be increased to one year for patients who have rapidly progressing disease, those who are  receiving or discontinuing medical therapy to restore bone mass, or have additional risk factors. I have reviewed this report, and agree with the above findings. [facility]iology Electronically Signed   By: Charlett Nose M.D.   On: 05/03/2015 13:08   Mm Digital Screening Bilateral  Result Date: 05/03/2015 CLINICAL DATA:  Screening. EXAM: DIGITAL SCREENING BILATERAL MAMMOGRAM WITH CAD COMPARISON:  Previous exam(s). ACR Breast Density Category b: There are scattered areas of fibroglandular density. FINDINGS: There are no findings suspicious for malignancy. Images were processed with CAD. IMPRESSION: No mammographic evidence of malignancy. A result letter of this screening mammogram will be mailed directly to the patient. RECOMMENDATION: Screening mammogram in one year if felt to be clinically indicated. (Code:SM-B-01Y) BI-RADS CATEGORY  1: Negative. Electronically Signed   By: Rolla Plate M.D.   On: 05/03/2015 13:02       Assessment & Plan:   Problem List Items Addressed This Visit    Aortic atherosclerosis (HCC)    On simvastatin.       Cough - Primary    Persistent cough.  Has improved.  Check cxr.  Did not take abx.        Relevant Orders   DG Chest 2 View (Completed)   GERD (gastroesophageal reflux disease)    On prilosec.  Acid reflux controlled.        Health care maintenance    Physical not due until 07/2016.        Hypercholesterolemia    On simvastatin.  Low cholesterol diet and exercise.  Follow lipid panel and liver function tests.        Hypertension    Blood pressure has been under good control.  Continue same medication regimen.  Follow pressures.  Follow metabolic panel.        Other Visit Diagnoses    Breast cancer screening       Relevant Orders   MM DIGITAL SCREENING BILATERAL   Injury of head, initial encounter       crock pot fell on her head.  no headache.  no dizziness.  no confusion.  follow.         Dale Pajaro, MD

## 2016-04-18 ENCOUNTER — Encounter: Payer: Self-pay | Admitting: Internal Medicine

## 2016-04-18 DIAGNOSIS — I700 Atherosclerosis of aorta: Secondary | ICD-10-CM | POA: Insufficient documentation

## 2016-04-22 ENCOUNTER — Encounter: Payer: Self-pay | Admitting: Internal Medicine

## 2016-04-22 NOTE — Assessment & Plan Note (Signed)
Blood pressure has been under good control.  Continue same medication regimen.  Follow pressures.  Follow metabolic panel.   

## 2016-04-22 NOTE — Assessment & Plan Note (Signed)
Persistent cough.  Has improved.  Check cxr.  Did not take abx.

## 2016-04-22 NOTE — Assessment & Plan Note (Signed)
On prilosec.  Acid reflux controlled.

## 2016-04-22 NOTE — Assessment & Plan Note (Signed)
Physical not due until 07/2016.

## 2016-04-22 NOTE — Assessment & Plan Note (Signed)
On simvastatin.  Low cholesterol diet and exercise.  Follow lipid panel and liver function tests.   

## 2016-04-22 NOTE — Assessment & Plan Note (Signed)
On simvastatin.   

## 2016-05-08 ENCOUNTER — Other Ambulatory Visit (INDEPENDENT_AMBULATORY_CARE_PROVIDER_SITE_OTHER): Payer: PPO

## 2016-05-08 DIAGNOSIS — I10 Essential (primary) hypertension: Secondary | ICD-10-CM

## 2016-05-08 DIAGNOSIS — E7800 Pure hypercholesterolemia, unspecified: Secondary | ICD-10-CM

## 2016-05-08 LAB — BASIC METABOLIC PANEL
BUN: 11 mg/dL (ref 6–23)
CALCIUM: 9.8 mg/dL (ref 8.4–10.5)
CO2: 33 meq/L — AB (ref 19–32)
CREATININE: 0.67 mg/dL (ref 0.40–1.20)
Chloride: 102 mEq/L (ref 96–112)
GFR: 88.09 mL/min (ref >60.00)
GLUCOSE: 101 mg/dL — AB (ref 70–99)
Potassium: 4.1 mEq/L (ref 3.5–5.1)
SODIUM: 137 meq/L (ref 135–145)

## 2016-05-08 LAB — LIPID PANEL
CHOLESTEROL: 156 mg/dL (ref 0–200)
HDL: 54.6 mg/dL (ref >39.00)
LDL Cholesterol: 74 mg/dL (ref 0–99)
NONHDL: 100.91
Total CHOL/HDL Ratio: 3
Triglycerides: 134 mg/dL (ref 0.0–149.0)
VLDL: 26.8 mg/dL (ref 0.0–40.0)

## 2016-05-08 LAB — HEPATIC FUNCTION PANEL
ALT: 12 U/L (ref 0–35)
AST: 17 U/L (ref 0–37)
Albumin: 4 g/dL (ref 3.5–5.2)
Alkaline Phosphatase: 56 U/L (ref 39–117)
BILIRUBIN DIRECT: 0.1 mg/dL (ref 0.0–0.3)
BILIRUBIN TOTAL: 0.6 mg/dL (ref 0.2–1.2)
Total Protein: 6.7 g/dL (ref 6.0–8.3)

## 2016-05-09 ENCOUNTER — Encounter: Payer: Self-pay | Admitting: Internal Medicine

## 2016-05-15 ENCOUNTER — Ambulatory Visit
Admission: RE | Admit: 2016-05-15 | Discharge: 2016-05-15 | Disposition: A | Discharge status: Home / Self Care | Payer: PPO | Source: Ambulatory Visit | Attending: Internal Medicine | Admitting: Internal Medicine

## 2016-05-15 DIAGNOSIS — Z1239 Encounter for other screening for malignant neoplasm of breast: Secondary | ICD-10-CM

## 2016-05-15 DIAGNOSIS — Z1231 Encounter for screening mammogram for malignant neoplasm of breast: Secondary | ICD-10-CM | POA: Diagnosis not present

## 2016-06-04 ENCOUNTER — Encounter: Payer: Self-pay | Admitting: Podiatry

## 2016-06-04 ENCOUNTER — Ambulatory Visit (INDEPENDENT_AMBULATORY_CARE_PROVIDER_SITE_OTHER): Payer: PPO | Admitting: Podiatry

## 2016-06-04 DIAGNOSIS — B351 Tinea unguium: Secondary | ICD-10-CM | POA: Diagnosis not present

## 2016-06-04 DIAGNOSIS — G629 Polyneuropathy, unspecified: Secondary | ICD-10-CM

## 2016-06-04 DIAGNOSIS — M79676 Pain in unspecified toe(s): Secondary | ICD-10-CM

## 2016-06-04 NOTE — Progress Notes (Signed)
Complaint:  Visit Type: Patient returns to my office for continued preventative foot care services. Complaint: Patient states" my nails have grown long and thick and become painful to walk and wear shoes" . The patient presents for preventative foot care services. No changes to ROS  Podiatric Exam: Vascular: dorsalis pedis and posterior tibial pulses are palpable bilateral. Capillary return is immediate. Temperature gradient is WNL. Skin turgor WNL  Sensorium: Normal Semmes Weinstein monofilament test. Normal tactile sensation bilaterally. Nail Exam: Pt has thick disfigured discolored nails with subungual debris noted bilateral entire nail hallux through fifth toenails Ulcer Exam: There is no evidence of ulcer or pre-ulcerative changes or infection. Orthopedic Exam: Muscle tone and strength are WNL. No limitations in general ROM. No crepitus or effusions noted. Foot type and digits show no abnormalities. Bony prominences are unremarkable. Skin: No Porokeratosis. No infection or ulcers  Diagnosis:  Onychomycosis, , Pain in right toe, pain in left toes  Treatment & Plan Procedures and Treatment: Consent by patient was obtained for treatment procedures. The patient understood the discussion of treatment and procedures well. All questions were answered thoroughly reviewed. Debridement of mycotic and hypertrophic toenails, 1 through 5 bilateral and clearing of subungual debris. No ulceration, no infection noted.  Return Visit-Office Procedure: Patient instructed to return to the office for a follow up visit 3 months   for continued evaluation and treatment.    Elizibeth Breau DPM 

## 2016-06-19 ENCOUNTER — Telehealth: Payer: Self-pay | Admitting: Internal Medicine

## 2016-06-19 ENCOUNTER — Other Ambulatory Visit: Payer: Self-pay | Admitting: Internal Medicine

## 2016-06-19 NOTE — Telephone Encounter (Signed)
Left pt message asking to call Allison back directly at [phone] to schedule AWV. Thanks! °

## 2016-07-12 ENCOUNTER — Other Ambulatory Visit: Payer: Self-pay

## 2016-07-12 MED ORDER — SIMVASTATIN 20 MG PO TABS: ORAL_TABLET | ORAL | 3 refills | Status: DC

## 2016-07-20 ENCOUNTER — Ambulatory Visit (INDEPENDENT_AMBULATORY_CARE_PROVIDER_SITE_OTHER): Payer: PPO | Admitting: Internal Medicine

## 2016-07-20 ENCOUNTER — Encounter: Payer: Self-pay | Admitting: Internal Medicine

## 2016-07-20 DIAGNOSIS — I10 Essential (primary) hypertension: Secondary | ICD-10-CM | POA: Diagnosis not present

## 2016-07-20 DIAGNOSIS — Z9109 Other allergy status, other than to drugs and biological substances: Secondary | ICD-10-CM

## 2016-07-20 DIAGNOSIS — E7800 Pure hypercholesterolemia, unspecified: Secondary | ICD-10-CM

## 2016-07-20 DIAGNOSIS — R05 Cough: Secondary | ICD-10-CM

## 2016-07-20 DIAGNOSIS — K219 Gastro-esophageal reflux disease without esophagitis: Secondary | ICD-10-CM

## 2016-07-20 DIAGNOSIS — I700 Atherosclerosis of aorta: Secondary | ICD-10-CM

## 2016-07-20 DIAGNOSIS — Z0000 Encounter for general adult medical examination without abnormal findings: Secondary | ICD-10-CM | POA: Diagnosis not present

## 2016-07-20 DIAGNOSIS — R059 Cough, unspecified: Secondary | ICD-10-CM

## 2016-07-20 MED ORDER — AZELASTINE HCL 0.1 % NA SOLN: 1.0000 | Freq: Two times a day (BID) | NASAL | 1 refills | Status: DC

## 2016-07-20 NOTE — Assessment & Plan Note (Signed)
Physical 07/20/16.  Mammogram 05/15/16 - Birads I.

## 2016-07-20 NOTE — Progress Notes (Signed)
Pre-visit discussion using our clinic review tool. No additional management support is needed unless otherwise documented below in the visit note.  

## 2016-07-20 NOTE — Progress Notes (Signed)
Patient ID: Janet Moran, female   DOB: [birth year], 83 y.o.   MRN: [id number]   Subjective:    Patient ID: Janet Moran, female    DOB: 1926/01/24, 83 y.o.   MRN: [id number]  HPI  Patient with past history of hypercholesterolemia and hypertension.  She comes in today to follow up on these issues as well as for a complete physical exam.  She reports some increased fatigue.  Overall relatively stable. Still does some walking.  No chest pain.  Breathing stable.  Some persistent increased drainage.  Worse when lying down.  The previous increased cough resolved.  Some minimal cough with above drainage.  No acid reflux.  No abdominal pain.  Bowels moving.  Discussed immunization.     Past Medical History:  Diagnosis Date  . Arthritis   . Chicken pox   . GERD (gastroesophageal reflux disease)   . Glaucoma   . Hypercholesterolemia   . Hypertension    Past Surgical History:  Procedure Laterality Date  . ABDOMINAL HYSTERECTOMY  1976  . TONSILLECTOMY AND ADENOIDECTOMY  1935   Family History  Problem Relation Age of Onset  . Lung cancer Sister     died 67  . Stroke Mother   . Stroke Father   . Diabetes Maternal Grandmother   . Diabetes Maternal Grandfather   . Breast cancer Maternal Aunt     47's   Social History   Social History  . Marital status: Married    Spouse name: N/A  . Number of children: 5  . Years of education: N/A   Social History Main Topics  . Smoking status: Never Smoker  . Smokeless tobacco: Never Used  . Alcohol use No  . Drug use: No  . Sexual activity: Not Asked   Other Topics Concern  . None   Social History Narrative  . None    Outpatient Encounter Prescriptions as of 07/20/2016  Medication Sig  . aspirin EC 81 MG tablet Take 81 mg by mouth daily.  . calcium carbonate (OS-CAL) 600 MG TABS Take 600 mg by mouth 2 (two) times daily with a meal. Take 1 tablet by mouth twice daily with food  . latanoprost (XALATAN) 0.005 % ophthalmic solution  Place 1 drop into both eyes at bedtime.  . metoprolol succinate (TOPROL-XL) 25 MG 24 hr tablet TAKE ONE (1) TABLET EACH DAY  . naproxen sodium (ANAPROX) 220 MG tablet Take 220 mg by mouth 2 (two) times daily with a meal.  . omeprazole (PRILOSEC) 20 MG capsule Take 20 mg by mouth daily.  . simvastatin (ZOCOR) 20 MG tablet TAKE ONE TABLET EACH EVENING AS DIRECTED  . triamcinolone cream (KENALOG) 0.1 % Apply 1 application topically 2 (two) times daily.  . [DISCONTINUED] amLODipine (NORVASC) 2.5 MG tablet Take 1 tablet (2.5 mg total) by mouth daily.  . [DISCONTINUED] losartan (COZAAR) 50 MG tablet TAKE ONE (1) TABLET EACH DAY  . [DISCONTINUED] metoprolol succinate (TOPROL-XL) 25 MG 24 hr tablet TAKE ONE (1) TABLET EACH DAY  . [DISCONTINUED] simvastatin (ZOCOR) 20 MG tablet TAKE ONE TABLET EACH EVENING AS DIRECTED  . amLODipine (NORVASC) 2.5 MG tablet Take 1 tablet (2.5 mg total) by mouth daily.  Marland Kitchen azelastine (ASTELIN) 0.1 % nasal spray Place 1 spray into both nostrils 2 (two) times daily. Use in each nostril as directed  . losartan (COZAAR) 50 MG tablet Take 1 tablet (50 mg total) by mouth daily.  . [DISCONTINUED] azithromycin (ZITHROMAX) 250 MG tablet Take 2  tablets x 1 day and then one tablet per day for four more days. (Patient not taking: Reported on 04/17/2016)   Facility-Administered Encounter Medications as of 07/20/2016  Medication  . cyanocobalamin ((VITAMIN B-12)) injection 1,000 mcg    Review of Systems  Constitutional: Positive for fatigue. Negative for appetite change and unexpected weight change.  HENT: Positive for congestion and postnasal drip. Negative for sinus pressure.   Eyes: Negative for pain and visual disturbance.  Respiratory: Negative for cough, chest tightness and shortness of breath.   Cardiovascular: Negative for chest pain, palpitations and leg swelling.  Gastrointestinal: Negative for abdominal pain, diarrhea, nausea and vomiting.  Genitourinary: Negative for  difficulty urinating and dysuria.  Musculoskeletal: Negative for back pain and joint swelling.  Skin: Negative for color change and rash.  Neurological: Negative for dizziness, light-headedness and headaches.  Hematological: Negative for adenopathy. Does not bruise/bleed easily.  Psychiatric/Behavioral: Negative for agitation and dysphoric mood.       Objective:    Physical Exam  Constitutional: She is oriented to person, place, and time. She appears well-developed and well-nourished. No distress.  HENT:  Nose: Nose normal.  Mouth/Throat: Oropharynx is clear and moist.  Eyes: Right eye exhibits no discharge. Left eye exhibits no discharge. No scleral icterus.  Neck: Neck supple. No thyromegaly present.  Cardiovascular: Normal rate and regular rhythm.   Pulmonary/Chest: Breath sounds normal. No accessory muscle usage. No tachypnea. No respiratory distress. She has no decreased breath sounds. She has no wheezes. She has no rhonchi. Right breast exhibits no inverted nipple, no mass, no nipple discharge and no tenderness (no axillary adenopathy). Left breast exhibits no inverted nipple, no mass, no nipple discharge and no tenderness (no axilarry adenopathy).  Abdominal: Soft. Bowel sounds are normal. There is no tenderness.  Musculoskeletal: She exhibits no edema or tenderness.  Lymphadenopathy:    She has no cervical adenopathy.  Neurological: She is alert and oriented to person, place, and time.  Skin: Skin is warm. No rash noted. No erythema.  Psychiatric: She has a normal mood and affect. Her behavior is normal.    BP 134/68 (BP Location: Left Arm, Patient Position: Sitting, Cuff Size: Normal)   Pulse 62   Temp 98.7 F (37.1 C) (Oral)   Resp 12   Ht  (1.651 m)   Wt 143 lb 3.2 oz (65 kg)   SpO2 97%   BMI 23.83 kg/m  Wt Readings from Last 3 Encounters:  07/20/16 143 lb 3.2 oz (65 kg)  04/17/16 146 lb 2 oz (66.3 kg)  04/06/16 148 lb 9.6 oz (67.4 kg)     Lab Results    Component Value Date   WBC 5.6 11/15/2015   HGB 14.8 11/15/2015   HCT 43.4 11/15/2015   PLT 207.0 11/15/2015   GLUCOSE 101 (H) 05/08/2016   CHOL 156 05/08/2016   TRIG 134.0 05/08/2016   HDL 54.60 05/08/2016   LDLCALC 74 05/08/2016   ALT 12 05/08/2016   AST 17 05/08/2016   NA 137 05/08/2016   K 4.1 05/08/2016   CL 102 05/08/2016   CREATININE 0.67 05/08/2016   BUN 11 05/08/2016   CO2 33 (H) 05/08/2016   TSH 3.20 11/15/2015    Mm Digital Screening Bilateral  Result Date: 05/15/2016 CLINICAL DATA:  Screening. EXAM: DIGITAL SCREENING BILATERAL MAMMOGRAM WITH CAD COMPARISON:  Previous exam(s). ACR Breast Density Category b: There are scattered areas of fibroglandular density. FINDINGS: There are no findings suspicious for malignancy. Images were processed with  CAD. IMPRESSION: No mammographic evidence of malignancy. A result letter of this screening mammogram will be mailed directly to the patient. RECOMMENDATION: Screening mammogram in one year. (Code:SM-B-01Y) BI-RADS CATEGORY  1: Negative. Electronically Signed   By: Bary Richard M.D.   On: 05/15/2016 12:55       Assessment & Plan:   Problem List Items Addressed This Visit    Aortic atherosclerosis (HCC)    On simvastatin.        Relevant Medications   simvastatin (ZOCOR) 20 MG tablet   metoprolol succinate (TOPROL-XL) 25 MG 24 hr tablet   losartan (COZAAR) 50 MG tablet   amLODipine (NORVASC) 2.5 MG tablet   Cough    Previous cough improved.  Some persistent drainage.  Add astelin nasal spray.  Follow.        Environmental allergies    Add astelin as outlined.  Follow.        GERD (gastroesophageal reflux disease)    Controlled.  No acid reflux problems now.  Follow.        Health care maintenance    Physical 07/20/16.  Mammogram 05/15/16 - Birads I.       Hypercholesterolemia    On simvastatin.  Low cholesterol diet and exercise.  Follow lipid panel and liver function tests.        Relevant Medications    simvastatin (ZOCOR) 20 MG tablet   metoprolol succinate (TOPROL-XL) 25 MG 24 hr tablet   losartan (COZAAR) 50 MG tablet   amLODipine (NORVASC) 2.5 MG tablet   Other Relevant Orders   Hepatic function panel   Lipid panel   Hypertension    Blood pressure under good control.  Continue same medication regimen.  Follow pressures.  Follow metabolic panel.        Relevant Medications   simvastatin (ZOCOR) 20 MG tablet   metoprolol succinate (TOPROL-XL) 25 MG 24 hr tablet   losartan (COZAAR) 50 MG tablet   amLODipine (NORVASC) 2.5 MG tablet   Other Relevant Orders   CBC with Differential/Platelet   TSH   Basic metabolic panel       Dale [place], MD

## 2016-07-22 ENCOUNTER — Encounter: Payer: Self-pay | Admitting: Internal Medicine

## 2016-07-22 MED ORDER — METOPROLOL SUCCINATE ER 25 MG PO TB24: ORAL_TABLET | ORAL | 5 refills | Status: DC

## 2016-07-22 MED ORDER — SIMVASTATIN 20 MG PO TABS: ORAL_TABLET | ORAL | 5 refills | Status: DC

## 2016-07-22 MED ORDER — LOSARTAN POTASSIUM 50 MG PO TABS: 50.0000 mg | ORAL_TABLET | Freq: Every day | ORAL | 5 refills | Status: DC

## 2016-07-22 MED ORDER — AMLODIPINE BESYLATE 2.5 MG PO TABS: 2.5000 mg | ORAL_TABLET | Freq: Every day | ORAL | 5 refills | Status: DC

## 2016-07-22 NOTE — Assessment & Plan Note (Signed)
Previous cough improved.  Some persistent drainage.  Add astelin nasal spray.  Follow.

## 2016-07-22 NOTE — Assessment & Plan Note (Signed)
Controlled.  No acid reflux problems now.  Follow.

## 2016-07-22 NOTE — Assessment & Plan Note (Signed)
Blood pressure under good control.  Continue same medication regimen.  Follow pressures.  Follow metabolic panel.   

## 2016-07-22 NOTE — Assessment & Plan Note (Signed)
Add astelin as outlined.  Follow.

## 2016-07-22 NOTE — Assessment & Plan Note (Signed)
On simvastatin.   

## 2016-07-22 NOTE — Assessment & Plan Note (Signed)
On simvastatin.  Low cholesterol diet and exercise.  Follow lipid panel and liver function tests.   

## 2016-08-08 NOTE — Telephone Encounter (Signed)
Pt spouse will have her call me back this afternoon

## 2016-08-27 DIAGNOSIS — H40003 Preglaucoma, unspecified, bilateral: Secondary | ICD-10-CM | POA: Diagnosis not present

## 2016-09-04 DIAGNOSIS — H40003 Preglaucoma, unspecified, bilateral: Secondary | ICD-10-CM | POA: Diagnosis not present

## 2016-09-10 ENCOUNTER — Ambulatory Visit (INDEPENDENT_AMBULATORY_CARE_PROVIDER_SITE_OTHER): Payer: PPO | Admitting: Podiatry

## 2016-09-10 ENCOUNTER — Encounter: Payer: Self-pay | Admitting: Podiatry

## 2016-09-10 DIAGNOSIS — B351 Tinea unguium: Secondary | ICD-10-CM | POA: Diagnosis not present

## 2016-09-10 DIAGNOSIS — M79676 Pain in unspecified toe(s): Secondary | ICD-10-CM

## 2016-09-10 DIAGNOSIS — G629 Polyneuropathy, unspecified: Secondary | ICD-10-CM

## 2016-09-10 NOTE — Progress Notes (Signed)
   Subjective:    Patient ID: Janet Moran, female    DOB: [birth year], 83 y.o.   MRN: [id number]  HPI    Review of Systems  All other systems reviewed and are negative.      Objective:   Physical Exam        Assessment & Plan:

## 2016-09-10 NOTE — Progress Notes (Signed)
   Subjective:    Patient ID: Janet Moran, female    DOB: 05/17/1925, 83 y.o.   MRN: [id number]  HPI this patient returns to the office for continued preventative foot care services. Patient states the nails have grown long and thick and become painful to walk and wear her shoes. She presents the office today for preventative foot care services    Review of Systems     Objective:   Physical Exam GENERAL APPEARANCE: Alert, conversant. Appropriately groomed. No acute distress.  VASCULAR: Pedal pulses are  palpable at  DP and PT bilateral.  Capillary refill time is immediate to all digits,  Normal temperature gradient.   NEUROLOGIC: sensation is normal to 5.07 monofilament at 5/5 sites bilateral.  Light touch is intact bilateral, Muscle strength normal.  MUSCULOSKELETAL: acceptable muscle strength, tone and stability bilateral.  Intrinsic muscluature intact bilateral.  Rectus appearance of foot and digits noted bilateral.  NAIL . Patient has thick disfigured discolored nails with subungual debris noted bilateral entire nails hallux through fifth toenails both feet DERMATOLOGIC: skin color, texture, and turgor are within normal limits.  No preulcerative lesions or ulcers  are seen, no interdigital maceration noted.  No open lesions present.  . No drainage noted.         Assessment & Plan:  Onychomycosis  B/L  Consent by patient was obtained for treatment procedures. The patient understood the discussion of the treatment and procedures well. All questions were answered thoroughly reviewed. Debridement of mycotic and hypertrophic nails 1 through 5 bilaterally was performed. No ulceration and no infection noted. Return to the clinic in 3 months for continued evaluation and treatment  Zillah Alexie DPM 

## 2016-09-12 ENCOUNTER — Encounter: Payer: Self-pay | Admitting: Family

## 2016-09-12 ENCOUNTER — Ambulatory Visit (INDEPENDENT_AMBULATORY_CARE_PROVIDER_SITE_OTHER): Payer: PPO | Admitting: Family

## 2016-09-12 VITALS — BP 148/68 | HR 65 | Temp 98.1°F | Ht 65.0 in | Wt 142.8 lb

## 2016-09-12 DIAGNOSIS — R197 Diarrhea, unspecified: Secondary | ICD-10-CM

## 2016-09-12 LAB — CBC WITH DIFFERENTIAL/PLATELET
Basophils Absolute: 0.1 10*3/uL (ref 0.0–0.1)
Basophils Relative: 1.2 % (ref 0.0–3.0)
EOS ABS: 0.2 10*3/uL (ref 0.0–0.7)
Eosinophils Relative: 1.5 % (ref 0.0–5.0)
HEMATOCRIT: 43.7 % (ref 36.0–46.0)
Hemoglobin: 14.5 g/dL (ref 12.0–15.0)
LYMPHS ABS: 1.2 10*3/uL (ref 0.7–4.0)
LYMPHS PCT: 10.3 % — AB (ref 12.0–46.0)
MCHC: 33.3 g/dL (ref 30.0–36.0)
MCV: 94.9 fl (ref 78.0–100.0)
Monocytes Absolute: 0.9 10*3/uL (ref 0.1–1.0)
Monocytes Relative: 7.3 % (ref 3.0–12.0)
NEUTROS ABS: 9.6 10*3/uL — AB (ref 1.4–7.7)
NEUTROS PCT: 79.7 % — AB (ref 43.0–77.0)
PLATELETS: 227 10*3/uL (ref 150.0–400.0)
RBC: 4.61 Mil/uL (ref 3.87–5.11)
RDW: 13.3 % (ref 11.5–15.5)
WBC: 12 10*3/uL — ABNORMAL HIGH (ref 4.0–10.5)

## 2016-09-12 LAB — COMPREHENSIVE METABOLIC PANEL
ALT: 14 U/L (ref 0–35)
AST: 18 U/L (ref 0–37)
Albumin: 4.1 g/dL (ref 3.5–5.2)
Alkaline Phosphatase: 55 U/L (ref 39–117)
BUN: 20 mg/dL (ref 6–23)
CALCIUM: 10.5 mg/dL (ref 8.4–10.5)
CHLORIDE: 101 meq/L (ref 96–112)
CO2: 29 meq/L (ref 19–32)
Creatinine, Ser: 0.78 mg/dL (ref 0.40–1.20)
GFR: 73.86 mL/min (ref >60.00)
GLUCOSE: 95 mg/dL (ref 70–99)
Potassium: 4.5 mEq/L (ref 3.5–5.1)
Sodium: 135 mEq/L (ref 135–145)
Total Bilirubin: 0.6 mg/dL (ref 0.2–1.2)
Total Protein: 7 g/dL (ref 6.0–8.3)

## 2016-09-12 NOTE — Patient Instructions (Signed)
As discussed, let's treat conservatively since diarrhea has resolved. Please ensure to complaining of fluids    I would not take Metamucil, prune juice at this time, at least for another week.  Labs today. Stool culture  Probiotics either from yogurt or supplement to help recolonize the gut  Let me know how you are doing and if diarrhea recurs

## 2016-09-12 NOTE — Progress Notes (Signed)
Pre visit review using our clinic review tool, if applicable. No additional management support is needed unless otherwise documented below in the visit note. 

## 2016-09-12 NOTE — Progress Notes (Signed)
Subjective:    Patient ID: Janet Moran, female    DOB: [birth year], 83 y.o.   MRN: [id number]  CC: GENOVA KINER is a 83 y.o. female who presents today for an acute visit.    HPI: CC: diarrhea 3 days ago, resolved. Prior to, one week ago had a diarrhea episode.   No diarrhea yesterday, actually 'became constipated.' She used a suppository and has had a BM today. Diarrhea non bloody. No abdominal pain, fever, chills, blood in stool, dysuria. Does note a 'rumbling in the belly.'  3 days ago had sharp pain in stomach and urgency to get to bathroom in which she had diarrhea  Notes she has sensitive stomach and has diarrhea from time to time. Does drink prune juice at night and metamucil to 'keep me regular.' Stopped doing those 2 weeks ago with first incident of diarrhea.   NO new medications. Not eating different foods.   Eating okay.   GERD- takes priloscec PRN.     Colonscopy 2012; normal, Dr Bluford Kaufmann. Esophagitis on EGD  HISTORY:  Past Medical History:  Diagnosis Date  . Arthritis   . Chicken pox   . GERD (gastroesophageal reflux disease)   . Glaucoma   . Hypercholesterolemia   . Hypertension    Past Surgical History:  Procedure Laterality Date  . ABDOMINAL HYSTERECTOMY  1976  . TONSILLECTOMY AND ADENOIDECTOMY  1935   Family History  Problem Relation Age of Onset  . Lung cancer Sister        died 25  . Stroke Mother   . Stroke Father   . Diabetes Maternal Grandmother   . Diabetes Maternal Grandfather   . Breast cancer Maternal Aunt        50's    Allergies: Avelox [moxifloxacin]; Crestor [rosuvastatin]; Pravachol [pravastatin]; and Vytorin [ezetimibe-simvastatin] Current Outpatient Prescriptions on File Prior to Visit  Medication Sig Dispense Refill  . amLODipine (NORVASC) 2.5 MG tablet Take 1 tablet (2.5 mg total) by mouth daily. 30 tablet 5  . aspirin EC 81 MG tablet Take 81 mg by mouth daily.    Marland Kitchen azelastine (ASTELIN) 0.1 % nasal spray Place 1 spray into  both nostrils 2 (two) times daily. Use in each nostril as directed 30 mL 1  . calcium carbonate (OS-CAL) 600 MG TABS Take 600 mg by mouth 2 (two) times daily with a meal. Take 1 tablet by mouth twice daily with food    . latanoprost (XALATAN) 0.005 % ophthalmic solution Place 1 drop into both eyes at bedtime.    Marland Kitchen losartan (COZAAR) 50 MG tablet Take 1 tablet (50 mg total) by mouth daily. 30 tablet 5  . metoprolol succinate (TOPROL-XL) 25 MG 24 hr tablet TAKE ONE (1) TABLET EACH DAY 30 tablet 5  . naproxen sodium (ANAPROX) 220 MG tablet Take 220 mg by mouth 2 (two) times daily with a meal.    . omeprazole (PRILOSEC) 20 MG capsule Take 20 mg by mouth daily.    . simvastatin (ZOCOR) 20 MG tablet TAKE ONE TABLET EACH EVENING AS DIRECTED 30 tablet 5   Current Facility-Administered Medications on File Prior to Visit  Medication Dose Route Frequency Provider Last Rate Last Dose  . cyanocobalamin ((VITAMIN B-12)) injection 1,000 mcg  1,000 mcg Intramuscular Once Dale Lemoore, MD        Social History  Substance Use Topics  . Smoking status: Never Smoker  . Smokeless tobacco: Never Used  . Alcohol use No    Review  of Systems  Constitutional: Negative for chills and fever.  Respiratory: Negative for cough.   Cardiovascular: Negative for chest pain and palpitations.  Gastrointestinal: Positive for constipation and diarrhea. Negative for abdominal distention, abdominal pain, blood in stool, nausea and vomiting.      Objective:    BP (!) 148/68   Pulse 65   Temp 98.1 F (36.7 C) (Oral)   Ht 5\' 5"  (1.651 m)   Wt 142 lb 12.8 oz (64.8 kg)   SpO2 96%   BMI 23.76 kg/m    Physical Exam  Constitutional: She appears well-developed and well-nourished.  Eyes: Conjunctivae are normal.  Cardiovascular: Normal rate, regular rhythm, normal heart sounds and normal pulses.   Pulmonary/Chest: Effort normal and breath sounds normal. She has no wheezes. She has no rhonchi. She has no rales.    Abdominal: Soft. Normal appearance and bowel sounds are normal. She exhibits no distension, no fluid wave, no ascites and no mass. There is no tenderness. There is no rigidity, no rebound, no guarding and no CVA tenderness.  Neurological: She is alert.  Skin: Skin is warm and dry.  Psychiatric: She has a normal mood and affect. Her speech is normal and behavior is normal. Thought content normal.  Vitals reviewed.      Assessment & Plan:   1. Diarrhea, unspecified type Afebrile. Patient is well-appearing and has no abdominal pain. Reassured by benign abdominal exam. Diarrhea has resolved at this time. Etiology of diarrhea is nonspecific at this time, however I suspect possible viral cause, or use of Metamucil/prune juice. Pending labs today.  Advised patient if diarrhea recurs, to let me know immediately. Return precautions given - CBC with Differential/Platelet - Comprehensive metabolic panel - C. difficile GDH and Toxin A/B; Future - Stool culture; Future    I have discontinued Ms. Gillispie's triamcinolone cream. I am also having her maintain her aspirin EC, omeprazole, latanoprost, calcium carbonate, naproxen sodium, azelastine, simvastatin, metoprolol succinate, losartan, and amLODipine. We will continue to administer cyanocobalamin.   No orders of the defined types were placed in this encounter.   Return precautions given.   Risks, benefits, and alternatives of the medications and treatment plan prescribed today were discussed, and patient expressed understanding.   Education regarding symptom management and diagnosis given to patient on AVS.  Continue to follow with Dale DurhamScott, Charlene, MD for routine health maintenance.   Janet HazyNancy J Robley and I agreed with plan.   Rennie PlowmanMargaret Arnett, FNP

## 2016-09-13 ENCOUNTER — Telehealth: Payer: Self-pay | Admitting: Family

## 2016-09-13 ENCOUNTER — Other Ambulatory Visit (INDEPENDENT_AMBULATORY_CARE_PROVIDER_SITE_OTHER): Payer: PPO

## 2016-09-13 ENCOUNTER — Other Ambulatory Visit: Payer: Self-pay | Admitting: Family

## 2016-09-13 ENCOUNTER — Ambulatory Visit
Admission: RE | Admit: 2016-09-13 | Discharge: 2016-09-13 | Disposition: A | Discharge status: Home / Self Care | Payer: PPO | Source: Ambulatory Visit | Attending: Family | Admitting: Family

## 2016-09-13 DIAGNOSIS — K429 Umbilical hernia without obstruction or gangrene: Secondary | ICD-10-CM | POA: Diagnosis not present

## 2016-09-13 DIAGNOSIS — D72829 Elevated white blood cell count, unspecified: Secondary | ICD-10-CM

## 2016-09-13 DIAGNOSIS — R197 Diarrhea, unspecified: Secondary | ICD-10-CM

## 2016-09-13 DIAGNOSIS — I700 Atherosclerosis of aorta: Secondary | ICD-10-CM | POA: Insufficient documentation

## 2016-09-13 MED ORDER — IOPAMIDOL (ISOVUE-300) INJECTION 61%
100.0000 mL | Freq: Once | INTRAVENOUS | Status: AC | PRN
  Administered 2016-09-13: 100 mL via INTRAVENOUS

## 2016-09-13 NOTE — Telephone Encounter (Signed)
Thought she was feeling better yesterday however had one episode diarrhea this morning. Non bloody. No fever, abdominal pain. Still feels rumbling in stomach.  Based on white count, we jointly decided to pursue CT adbdomen.

## 2016-09-14 LAB — C. DIFFICILE GDH AND TOXIN A/B
C. DIFF TOXIN A/B: NOT DETECTED
C. difficile GDH: NOT DETECTED

## 2016-09-17 LAB — STOOL CULTURE

## 2016-09-21 ENCOUNTER — Other Ambulatory Visit (INDEPENDENT_AMBULATORY_CARE_PROVIDER_SITE_OTHER): Payer: PPO

## 2016-09-21 DIAGNOSIS — D72829 Elevated white blood cell count, unspecified: Secondary | ICD-10-CM

## 2016-09-21 LAB — CBC WITH DIFFERENTIAL/PLATELET
BASOS ABS: 0.1 10*3/uL (ref 0.0–0.1)
BASOS PCT: 1.2 % (ref 0.0–3.0)
EOS PCT: 3.1 % (ref 0.0–5.0)
Eosinophils Absolute: 0.2 10*3/uL (ref 0.0–0.7)
HEMATOCRIT: 40.5 % (ref 36.0–46.0)
Hemoglobin: 13.8 g/dL (ref 12.0–15.0)
Lymphocytes Relative: 25.3 % (ref 12.0–46.0)
Lymphs Abs: 1.3 10*3/uL (ref 0.7–4.0)
MCHC: 34 g/dL (ref 30.0–36.0)
MCV: 94 fl (ref 78.0–100.0)
MONOS PCT: 10.6 % (ref 3.0–12.0)
Monocytes Absolute: 0.5 10*3/uL (ref 0.1–1.0)
Neutro Abs: 3 10*3/uL (ref 1.4–7.7)
Neutrophils Relative %: 59.8 % (ref 43.0–77.0)
Platelets: 209 10*3/uL (ref 150.0–400.0)
RBC: 4.31 Mil/uL (ref 3.87–5.11)
RDW: 13.1 % (ref 11.5–15.5)
WBC: 5 10*3/uL (ref 4.0–10.5)

## 2016-10-19 ENCOUNTER — Ambulatory Visit (INDEPENDENT_AMBULATORY_CARE_PROVIDER_SITE_OTHER): Payer: PPO

## 2016-10-19 VITALS — BP 122/60 | HR 66 | Temp 98.4°F | Resp 14 | Ht 65.0 in | Wt 143.0 lb

## 2016-10-19 DIAGNOSIS — Z0000 Encounter for general adult medical examination without abnormal findings: Secondary | ICD-10-CM

## 2016-10-19 NOTE — Progress Notes (Signed)
Subjective:   Janet Moran is a 83 y.o. female who presents for an Initial Medicare Annual Wellness Visit.  Review of Systems    No ROS.  Medicare Wellness Visit. Additional risk factors are reflected in the social history.  Cardiac Risk Factors include: advanced age (>43men, >9 women);hypertension     Objective:    Today's Vitals   10/19/16 1401  BP: 122/60  Pulse: 66  Resp: 14  Temp: 98.4 F (36.9 C)  TempSrc: Oral  SpO2: 95%  Weight: 143 lb (64.9 kg)  Height: 5\' 5"  (1.651 m)   Body mass index is 23.8 kg/m.   Current Medications (verified) Outpatient Encounter Prescriptions as of 10/19/2016  Medication Sig  . amLODipine (NORVASC) 2.5 MG tablet Take 1 tablet (2.5 mg total) by mouth daily.  Marland Kitchen aspirin EC 81 MG tablet Take 81 mg by mouth daily.  Marland Kitchen azelastine (ASTELIN) 0.1 % nasal spray Place 1 spray into both nostrils 2 (two) times daily. Use in each nostril as directed  . calcium carbonate (OS-CAL) 600 MG TABS Take 600 mg by mouth 2 (two) times daily with a meal. Take 1 tablet by mouth twice daily with food  . latanoprost (XALATAN) 0.005 % ophthalmic solution Place 1 drop into both eyes at bedtime.  Marland Kitchen losartan (COZAAR) 50 MG tablet Take 1 tablet (50 mg total) by mouth daily.  . metoprolol succinate (TOPROL-XL) 25 MG 24 hr tablet TAKE ONE (1) TABLET EACH DAY  . naproxen sodium (ANAPROX) 220 MG tablet Take 220 mg by mouth 2 (two) times daily with a meal.  . omeprazole (PRILOSEC) 20 MG capsule Take 20 mg by mouth daily.  . Probiotic Product (PROBIOTIC DAILY) CAPS Take 1 capsule by mouth daily.  . simvastatin (ZOCOR) 20 MG tablet TAKE ONE TABLET EACH EVENING AS DIRECTED   Facility-Administered Encounter Medications as of 10/19/2016  Medication  . cyanocobalamin ((VITAMIN B-12)) injection 1,000 mcg    Allergies (verified) Avelox [moxifloxacin]; Crestor [rosuvastatin]; Pravachol [pravastatin]; and Vytorin [ezetimibe-simvastatin]   History: Past Medical History:    Diagnosis Date  . Arthritis   . Chicken pox   . GERD (gastroesophageal reflux disease)   . Glaucoma   . Hypercholesterolemia   . Hypertension    Past Surgical History:  Procedure Laterality Date  . ABDOMINAL HYSTERECTOMY  1976  . TONSILLECTOMY AND ADENOIDECTOMY  1935   Family History  Problem Relation Age of Onset  . Lung cancer Sister        died 39  . Stroke Mother   . Stroke Father   . Diabetes Maternal Grandmother   . Diabetes Maternal Grandfather   . Breast cancer Maternal Aunt        50's  . Breast cancer Daughter    Social History   Occupational History  . Not on file.   Social History Main Topics  . Smoking status: Never Smoker  . Smokeless tobacco: Never Used  . Alcohol use No  . Drug use: No  . Sexual activity: No    Tobacco Counseling Counseling given: Not Answered   Activities of Daily Living In your present state of health, do you have any difficulty performing the following activities: 10/19/2016  Hearing? N  Vision? N  Difficulty concentrating or making decisions? N  Walking or climbing stairs? Y  Dressing or bathing? N  Doing errands, shopping? N  Preparing Food and eating ? N  Using the Toilet? N  In the past six months, have you accidently leaked urine?  N  Do you have problems with loss of bowel control? N  Managing your Medications? N  Managing your Finances? N  Housekeeping or managing your Housekeeping? Y  Some recent data might be hidden    Immunizations and Health Maintenance Immunization History  Administered Date(s) Administered  . Influenza Split 01/14/2012, 12/15/2013  . Influenza, High Dose Seasonal PF 01/04/2016  . Influenza,inj,Quad PF,36+ Mos 02/24/2013, 03/15/2015   Health Maintenance Due  Topic Date Due  . TETANUS/TDAP  05/22/1946  . PNA vac Low Risk Adult (1 of 2 - PCV13) 05/22/1992    Patient Care Team: [place], MD as PCP - General (Internal Medicine)  Indicate any recent Medical Services you may  have received from other than [facility] providers in the past year (date may be approximate).     Assessment:   This is a routine wellness examination for Janet Moran. The goal of the wellness visit is to assist the patient how to close the gaps in care and create a preventative care plan for the patient.   The roster of all physicians providing medical care to patient is listed in the Snapshot section of the chart.  Taking calcium as appropriate/Osteoporosis reviewed.    Safety issues reviewed; Smoke and carbon monoxide detectors in the home. No firearms in the home.  Wears seatbelts when driving or riding with others. Patient does wear sunscreen or protective clothing when in direct sunlight. No violence in the home.  Patient is alert, normal appearance, oriented to person/place/and time.  Correctly identified the president of the Janet Moran, recall of 3/3 words, and performing simple calculations. Displays appropriate judgement and can read correct time from watch face.   No new identified risk were noted.  No failures at ADL's or IADL's.    BMI- discussed the importance of a healthy diet, water intake and the benefits of aerobic exercise. Educational material provided.   24 hour diet recall: palm size servings Breakfast: Cereal, fruit Lunch: 1/3 BLT sandwich, fruit, cookie Dinner: [employer], green vegetable, bread Snack: Ice Cream scoop Daily fluid intake: 1 cups of caffeine, 5 cups of water  Dental- every  6 months. Janet Moran.  Sleep patterns- Sleeps 7-8 hours at night.  Wakes feeling rested.  TDAP and Prevnar 13 vaccine deferred per patient preference.  Follow up with insurance.  Educational material provided.  Patient Concerns: None at this time. Follow up with PCP as needed.  Hearing/Vision screen Hearing Screening Comments: Patient is able to hear conversational tones without difficulty.  No issues reported.   Vision Screening Comments: Followed by Janet Moran Wears  corrective lenses Last OV 08/2016 Semi-annual visits Glaucoma R eye, drops in use Cataract extraction, bilateral Visual acuity not assessed per patient preference since they have regular follow up with the ophthalmologist  Dietary issues and exercise activities discussed: Current Exercise Habits: Home exercise routine, Type of exercise: walking, Frequency (Times/Week): 3, Intensity: Mild  Goals    . Increase physical activity          Stretch and continue walking for exercise      Depression Screen PHQ 2/9 Scores 10/19/2016 09/12/2016 04/17/2016 01/04/2016 07/19/2015 07/13/2014 07/28/2013  PHQ - 2 Score 0 0 0 0 0 0 1    Fall Risk Fall Risk  10/19/2016 09/12/2016 04/17/2016 01/04/2016 07/19/2015  Falls in the past year? No No No No Yes  Number falls in past yr: - - - - 1  Injury with Fall? - - - - No  Risk for fall  due to : - - History of fall(s) - -    Cognitive Function: MMSE - Mini Mental State Exam 10/19/2016  Orientation to time 5  Orientation to Place 5  Registration 3  Attention/ Calculation 5  Recall 3  Language- name 2 objects 2  Language- repeat 1  Language- follow 3 step command 3  Language- read & follow direction 1  Write a sentence 1  Copy design 1  Total score 30        Screening Tests Health Maintenance  Topic Date Due  . TETANUS/TDAP  05/22/1946  . PNA vac Low Risk Adult (1 of 2 - PCV13) 05/22/1992  . INFLUENZA VACCINE  11/07/2016  . MAMMOGRAM  05/15/2017  . DEXA SCAN  Completed      Plan:    End of life planning; Advance aging; Advanced directives discussed. Copy of current HCPOA/Living Will requested.    I have personally reviewed and noted the following in the patient's chart:   . Medical and social history . Use of alcohol, tobacco or illicit drugs  . Current medications and supplements . Functional ability and status . Nutritional status . Physical activity . Advanced directives . List of other physicians . Hospitalizations, surgeries, and ER  visits in previous 12 months . Vitals . Screenings to include cognitive, depression, and falls . Referrals and appointments  In addition, I have reviewed and discussed with patient certain preventive protocols, quality metrics, and best practice recommendations. A written personalized care plan for preventive services as well as general preventive health recommendations were provided to patient.     Ashok PallOBrien-Blaney, Jameyah Fennewald L, LPN   1/61/09607/13/2018   Reviewed above information.  Agree with assessment and plan.   Dr Lorin PicketScott

## 2016-10-19 NOTE — Patient Instructions (Addendum)
  Ms. Casimiro NeedleMichael , Thank you for taking time to come for your Medicare Wellness Visit. I appreciate your ongoing commitment to your health goals. Please review the following plan we discussed and let me know if I can assist you in the future.   Follow up with Dr. Lorin PicketScott as needed.    Bring a copy of your Health Care Power of Attorney and/or Living Will to be scanned into chart.  Have a great day!  These are the goals we discussed: Goals    . Increase physical activity          Stretch and continue walking for exercise       This is a list of the screening recommended for you and due dates:  Health Maintenance  Topic Date Due  . Tetanus Vaccine  05/22/1946  . Pneumonia vaccines (1 of 2 - PCV13) 05/22/1992  . Flu Shot  11/07/2016  . Mammogram  05/15/2017  . DEXA scan (bone density measurement)  Completed

## 2016-10-24 DIAGNOSIS — L821 Other seborrheic keratosis: Secondary | ICD-10-CM | POA: Diagnosis not present

## 2016-11-26 ENCOUNTER — Other Ambulatory Visit (INDEPENDENT_AMBULATORY_CARE_PROVIDER_SITE_OTHER): Payer: PPO

## 2016-11-26 DIAGNOSIS — E7800 Pure hypercholesterolemia, unspecified: Secondary | ICD-10-CM | POA: Diagnosis not present

## 2016-11-26 DIAGNOSIS — I10 Essential (primary) hypertension: Secondary | ICD-10-CM | POA: Diagnosis not present

## 2016-11-26 LAB — LIPID PANEL
CHOL/HDL RATIO: 3
CHOLESTEROL: 158 mg/dL (ref 0–200)
HDL: 60.8 mg/dL (ref >39.00)
LDL Cholesterol: 73 mg/dL (ref 0–99)
NonHDL: 97.66
Triglycerides: 123 mg/dL (ref 0.0–149.0)
VLDL: 24.6 mg/dL (ref 0.0–40.0)

## 2016-11-26 LAB — CBC WITH DIFFERENTIAL/PLATELET
BASOS ABS: 0.1 10*3/uL (ref 0.0–0.1)
BASOS PCT: 1.3 % (ref 0.0–3.0)
EOS PCT: 3.7 % (ref 0.0–5.0)
Eosinophils Absolute: 0.2 10*3/uL (ref 0.0–0.7)
HEMATOCRIT: 43.8 % (ref 36.0–46.0)
Hemoglobin: 14.8 g/dL (ref 12.0–15.0)
LYMPHS ABS: 1.3 10*3/uL (ref 0.7–4.0)
LYMPHS PCT: 27.7 % (ref 12.0–46.0)
MCHC: 33.7 g/dL (ref 30.0–36.0)
MCV: 96.8 fl (ref 78.0–100.0)
MONOS PCT: 8.4 % (ref 3.0–12.0)
Monocytes Absolute: 0.4 10*3/uL (ref 0.1–1.0)
NEUTROS ABS: 2.7 10*3/uL (ref 1.4–7.7)
NEUTROS PCT: 58.9 % (ref 43.0–77.0)
PLATELETS: 197 10*3/uL (ref 150.0–400.0)
RBC: 4.53 Mil/uL (ref 3.87–5.11)
RDW: 13.4 % (ref 11.5–15.5)
WBC: 4.5 10*3/uL (ref 4.0–10.5)

## 2016-11-26 LAB — HEPATIC FUNCTION PANEL
ALBUMIN: 3.8 g/dL (ref 3.5–5.2)
ALK PHOS: 51 U/L (ref 39–117)
ALT: 14 U/L (ref 0–35)
AST: 17 U/L (ref 0–37)
Bilirubin, Direct: 0.2 mg/dL (ref 0.0–0.3)
TOTAL PROTEIN: 7 g/dL (ref 6.0–8.3)
Total Bilirubin: 0.6 mg/dL (ref 0.2–1.2)

## 2016-11-26 LAB — BASIC METABOLIC PANEL
BUN: 12 mg/dL (ref 6–23)
CALCIUM: 9.8 mg/dL (ref 8.4–10.5)
CO2: 29 mEq/L (ref 19–32)
Chloride: 103 mEq/L (ref 96–112)
Creatinine, Ser: 0.64 mg/dL (ref 0.40–1.20)
GFR: 92.76 mL/min (ref >60.00)
GLUCOSE: 98 mg/dL (ref 70–99)
Potassium: 3.8 mEq/L (ref 3.5–5.1)
SODIUM: 136 meq/L (ref 135–145)

## 2016-11-26 LAB — TSH: TSH: 4.22 u[IU]/mL (ref 0.35–4.50)

## 2016-11-27 ENCOUNTER — Ambulatory Visit: Payer: Self-pay | Admitting: Internal Medicine

## 2016-11-28 ENCOUNTER — Encounter: Payer: Self-pay | Admitting: Internal Medicine

## 2016-12-05 NOTE — Telephone Encounter (Signed)
Letter mailed

## 2016-12-07 ENCOUNTER — Ambulatory Visit (INDEPENDENT_AMBULATORY_CARE_PROVIDER_SITE_OTHER): Payer: PPO | Admitting: Internal Medicine

## 2016-12-07 ENCOUNTER — Encounter: Payer: Self-pay | Admitting: Internal Medicine

## 2016-12-07 DIAGNOSIS — K219 Gastro-esophageal reflux disease without esophagitis: Secondary | ICD-10-CM | POA: Diagnosis not present

## 2016-12-07 DIAGNOSIS — Z9109 Other allergy status, other than to drugs and biological substances: Secondary | ICD-10-CM

## 2016-12-07 DIAGNOSIS — I10 Essential (primary) hypertension: Secondary | ICD-10-CM

## 2016-12-07 DIAGNOSIS — E7800 Pure hypercholesterolemia, unspecified: Secondary | ICD-10-CM

## 2016-12-07 DIAGNOSIS — I700 Atherosclerosis of aorta: Secondary | ICD-10-CM

## 2016-12-07 DIAGNOSIS — M549 Dorsalgia, unspecified: Secondary | ICD-10-CM | POA: Diagnosis not present

## 2016-12-07 DIAGNOSIS — Z23 Encounter for immunization: Secondary | ICD-10-CM

## 2016-12-07 MED ORDER — AZELASTINE HCL 0.1 % NA SOLN: 1.0000 | Freq: Two times a day (BID) | NASAL | 2 refills | Status: DC

## 2016-12-07 MED ORDER — AMLODIPINE BESYLATE 2.5 MG PO TABS: 2.5000 mg | ORAL_TABLET | Freq: Every day | ORAL | 5 refills | Status: DC

## 2016-12-07 MED ORDER — METOPROLOL SUCCINATE ER 25 MG PO TB24
ORAL_TABLET | ORAL | 5 refills | Status: DC
Start: 2016-12-07 — End: 2017-10-11

## 2016-12-07 MED ORDER — LOSARTAN POTASSIUM 50 MG PO TABS: 50.0000 mg | ORAL_TABLET | Freq: Every day | ORAL | 5 refills | Status: DC

## 2016-12-07 NOTE — Progress Notes (Signed)
Patient ID: Janet Moran, female   DOB: 08-May-1925, 83 y.o.   MRN: [id number]   Subjective:    Patient ID: Janet Moran, female    DOB: 08-May-1925, 83 y.o.   MRN: [id number]  HPI  Patient here for a scheduled follow up.  She reports she is doing relatively well.  Diarrhea has resolved.  Eating well.  No nausea or vomiting.  No abdominal pain.  No chest pain.  No sob.  Some allergy symptoms persist, but feels astelin helps.  Some persistent back discomfort, but overall feels stable.  Is aggravated by lifting certain things.     Past Medical History:  Diagnosis Date  . Arthritis   . Chicken pox   . GERD (gastroesophageal reflux disease)   . Glaucoma   . Hypercholesterolemia   . Hypertension    Past Surgical History:  Procedure Laterality Date  . ABDOMINAL HYSTERECTOMY  1976  . TONSILLECTOMY AND ADENOIDECTOMY  1935   Family History  Problem Relation Age of Onset  . Lung cancer Sister        died [id number]  . Stroke Mother   . Stroke Father   . Diabetes Maternal Grandmother   . Diabetes Maternal Grandfather   . Breast cancer Maternal Aunt        50's  . Breast cancer Daughter    Social History   Social History  . Marital status: Married    Spouse name: N/A  . Number of children: 5  . Years of education: N/A   Social History Main Topics  . Smoking status: Never Smoker  . Smokeless tobacco: Never Used  . Alcohol use No  . Drug use: No  . Sexual activity: No   Other Topics Concern  . None   Social History Narrative  . None    Outpatient Encounter Prescriptions as of 12/07/2016  Medication Sig  . amLODipine (NORVASC) 2.5 MG tablet Take 1 tablet (2.5 mg total) by mouth daily.  Marland Kitchen. aspirin EC 81 MG tablet Take 81 mg by mouth daily.  Marland Kitchen. azelastine (ASTELIN) 0.1 % nasal spray Place 1 spray into both nostrils 2 (two) times daily. Use in each nostril as directed  . calcium carbonate (OS-CAL) 600 MG TABS Take 600 mg by mouth 2 (two) times daily with a meal. Take 1 tablet by  mouth twice daily with food  . latanoprost (XALATAN) 0.005 % ophthalmic solution Place 1 drop into both eyes at bedtime.  Marland Kitchen. losartan (COZAAR) 50 MG tablet Take 1 tablet (50 mg total) by mouth daily.  . metoprolol succinate (TOPROL-XL) 25 MG 24 hr tablet TAKE ONE (1) TABLET EACH DAY  . naproxen sodium (ANAPROX) 220 MG tablet Take 220 mg by mouth 2 (two) times daily with a meal.  . omeprazole (PRILOSEC) 20 MG capsule Take 20 mg by mouth daily.  . Probiotic Product (PROBIOTIC DAILY) CAPS Take 1 capsule by mouth daily.  . simvastatin (ZOCOR) 20 MG tablet TAKE ONE TABLET EACH EVENING AS DIRECTED  . timolol (TIMOPTIC) 0.5 % ophthalmic solution   . [DISCONTINUED] amLODipine (NORVASC) 2.5 MG tablet Take 1 tablet (2.5 mg total) by mouth daily.  . [DISCONTINUED] azelastine (ASTELIN) 0.1 % nasal spray Place 1 spray into both nostrils 2 (two) times daily. Use in each nostril as directed  . [DISCONTINUED] losartan (COZAAR) 50 MG tablet Take 1 tablet (50 mg total) by mouth daily.  . [DISCONTINUED] metoprolol succinate (TOPROL-XL) 25 MG 24 hr tablet TAKE ONE (1) TABLET EACH DAY  Facility-Administered Encounter Medications as of 12/07/2016  Medication  . cyanocobalamin ((VITAMIN B-12)) injection 1,000 mcg    Review of Systems  Constitutional: Negative for appetite change and unexpected weight change.  HENT: Negative for sinus pressure.        Allergy symptoms as outlined.    Respiratory: Negative for cough, chest tightness and shortness of breath.   Cardiovascular: Negative for chest pain, palpitations and leg swelling.  Gastrointestinal: Negative for abdominal pain, diarrhea, nausea and vomiting.  Genitourinary: Negative for difficulty urinating and dysuria.  Musculoskeletal: Positive for back pain. Negative for myalgias.  Skin: Negative for color change and rash.  Neurological: Negative for dizziness, light-headedness and headaches.  Psychiatric/Behavioral: Negative for agitation and dysphoric mood.         Objective:    Physical Exam  Constitutional: She appears well-developed and well-nourished. No distress.  HENT:  Nose: Nose normal.  Mouth/Throat: Oropharynx is clear and moist.  Neck: Neck supple. No thyromegaly present.  Cardiovascular: Normal rate and regular rhythm.   Pulmonary/Chest: Breath sounds normal. No respiratory distress. She has no wheezes.  Abdominal: Soft. Bowel sounds are normal. There is no tenderness.  Musculoskeletal: She exhibits no edema or tenderness.  Lymphadenopathy:    She has no cervical adenopathy.  Skin: No rash noted. No erythema.  Psychiatric: She has a normal mood and affect. Her behavior is normal.    BP 130/64 (BP Location: Left Arm, Patient Position: Sitting, Cuff Size: Normal)   Pulse 61   Temp 98.6 F (37 C) (Oral)   Resp 14   Ht 5\' 5"  (1.651 m)   Wt 142 lb 9.6 oz (64.7 kg)   SpO2 97%   BMI 23.73 kg/m  Wt Readings from Last 3 Encounters:  12/07/16 142 lb 9.6 oz (64.7 kg)  10/19/16 143 lb (64.9 kg)  09/12/16 142 lb 12.8 oz (64.8 kg)     Lab Results  Component Value Date   WBC 4.5 11/26/2016   HGB 14.8 11/26/2016   HCT 43.8 11/26/2016   PLT 197.0 11/26/2016   GLUCOSE 98 11/26/2016   CHOL 158 11/26/2016   TRIG 123.0 11/26/2016   HDL 60.80 11/26/2016   LDLCALC 73 11/26/2016   ALT 14 11/26/2016   AST 17 11/26/2016   NA 136 11/26/2016   K 3.8 11/26/2016   CL 103 11/26/2016   CREATININE 0.64 11/26/2016   BUN 12 11/26/2016   CO2 29 11/26/2016   TSH 4.22 11/26/2016    Ct Abdomen Pelvis W Contrast  Result Date: 09/13/2016 CLINICAL DATA:  Diarrhea for 3 weeks. EXAM: CT ABDOMEN AND PELVIS WITH CONTRAST TECHNIQUE: Multidetector CT imaging of the abdomen and pelvis was performed using the standard protocol following bolus administration of intravenous contrast. CONTRAST:  ISOVUE-300 IOPAMIDOL (ISOVUE-300) INJECTION 61% COMPARISON:  09/20/2010. FINDINGS: Lower chest: Lung bases show no acute findings. Heart is mildly  enlarged. No pericardial or pleural effusion. Small hiatal hernia. Hepatobiliary: Subcentimeter low-attenuation lesion in the dome of the liver is too small to characterize but likely a benign cyst. Liver and gallbladder are otherwise unremarkable. No biliary ductal dilatation. Pancreas: Negative. Spleen: Negative. Adrenals/Urinary Tract: Adrenal glands and right kidney are unremarkable. There may be a small stone in the lower pole left kidney. Ureters are decompressed. Bladder is grossly unremarkable. Stomach/Bowel: Small hiatal hernia. Stomach, small bowel and colon are unremarkable. Appendix is not well-visualized. Vascular/Lymphatic: Atherosclerotic calcification of the arterial vasculature without abdominal aortic aneurysm. No pathologically enlarged lymph nodes. Reproductive: Hysterectomy.  No adnexal mass.  Other: No free fluid. Mesenteries and peritoneum are unremarkable. Small periumbilical hernia contains fat. Musculoskeletal: Degenerative changes in the spine. IMPRESSION: 1. No findings to explain the patient's diarrhea. 2. There may be a small stone in the lower pole left kidney. 3.  Aortic atherosclerosis (ICD10-170.0). Electronically Signed   By: Leanna Battles M.D.   On: 09/13/2016 14:39       Assessment & Plan:   Problem List Items Addressed This Visit    Aortic atherosclerosis (HCC)    On simvastatin.       Relevant Medications   amLODipine (NORVASC) 2.5 MG tablet   losartan (COZAAR) 50 MG tablet   metoprolol succinate (TOPROL-XL) 25 MG 24 hr tablet   Back pain    Some flares intermittently.  Overall stable.  Desires no further intervention at this time.        Environmental allergies    Doing some better on astelin.  Continue current regimen.  Follow.       GERD (gastroesophageal reflux disease)    Controlled on current regimen.  Follow.        Hypercholesterolemia    On simvastatin.  Low cholesterol diet and exercise.  Follow lipid panel and liver function tests.         Relevant Medications   amLODipine (NORVASC) 2.5 MG tablet   losartan (COZAAR) 50 MG tablet   metoprolol succinate (TOPROL-XL) 25 MG 24 hr tablet   Other Relevant Orders   Hepatic function panel   Lipid panel   Hypertension    Blood pressure under good control.  Continue same medication regimen.  Follow pressures.  Follow metabolic panel.        Relevant Medications   amLODipine (NORVASC) 2.5 MG tablet   losartan (COZAAR) 50 MG tablet   metoprolol succinate (TOPROL-XL) 25 MG 24 hr tablet   Other Relevant Orders   Basic metabolic panel    Other Visit Diagnoses    Encounter for immunization       Relevant Medications   timolol (TIMOPTIC) 0.5 % ophthalmic solution   amLODipine (NORVASC) 2.5 MG tablet   azelastine (ASTELIN) 0.1 % nasal spray   losartan (COZAAR) 50 MG tablet   metoprolol succinate (TOPROL-XL) 25 MG 24 hr tablet   Other Relevant Orders   Flu vaccine HIGH DOSE PF (Completed)       Dale Payson, MD

## 2016-12-09 ENCOUNTER — Encounter: Payer: Self-pay | Admitting: Internal Medicine

## 2016-12-09 NOTE — Assessment & Plan Note (Signed)
Blood pressure under good control.  Continue same medication regimen.  Follow pressures.  Follow metabolic panel.   

## 2016-12-09 NOTE — Assessment & Plan Note (Signed)
Doing some better on astelin.  Continue current regimen.  Follow.

## 2016-12-09 NOTE — Assessment & Plan Note (Signed)
Some flares intermittently.  Overall stable.  Desires no further intervention at this time.

## 2016-12-09 NOTE — Assessment & Plan Note (Signed)
On simvastatin.   

## 2016-12-09 NOTE — Assessment & Plan Note (Signed)
Controlled on current regimen.  Follow.  

## 2016-12-09 NOTE — Assessment & Plan Note (Signed)
On simvastatin.  Low cholesterol diet and exercise.  Follow lipid panel and liver function tests.   

## 2016-12-17 ENCOUNTER — Encounter: Payer: Self-pay | Admitting: Podiatry

## 2016-12-17 ENCOUNTER — Ambulatory Visit (INDEPENDENT_AMBULATORY_CARE_PROVIDER_SITE_OTHER): Payer: PPO | Admitting: Podiatry

## 2016-12-17 DIAGNOSIS — G629 Polyneuropathy, unspecified: Secondary | ICD-10-CM

## 2016-12-17 DIAGNOSIS — B351 Tinea unguium: Secondary | ICD-10-CM | POA: Diagnosis not present

## 2016-12-17 DIAGNOSIS — M79676 Pain in unspecified toe(s): Secondary | ICD-10-CM | POA: Diagnosis not present

## 2016-12-17 NOTE — Progress Notes (Signed)
   Subjective:    Patient ID: Janet Moran, female    DOB: [birth year], 83 y.o.   MRN: [id number]  HPI this patient returns to the office for continued preventative foot care services. Patient states the nails have grown long and thick and become painful to walk and wear her shoes. She presents the office today for preventative foot care services    Review of Systems     Objective:   Physical Exam GENERAL APPEARANCE: Alert, conversant. Appropriately groomed. No acute distress.  VASCULAR: Pedal [facility]  [facility]  [facility].  Capillary refill time is immediate to all digits,  Normal temperature gradient.   NEUROLOGIC: sensation is normal to 5.07 monofilament at 5/5 sites bilateral.  Light touch is intact bilateral, Muscle strength normal.  MUSCULOSKELETAL: acceptable muscle strength, tone and stability bilateral.  Intrinsic muscluature intact bilateral.  Rectus appearance of foot and digits noted bilateral.  NAIL . Patient has thick disfigured discolored nails with subungual debris noted bilateral entire nails hallux through fifth toenails both feet DERMATOLOGIC: skin color, texture, and turgor are within normal limits.  No preulcerative lesions or ulcers  are seen, no interdigital maceration noted.  No open lesions present.  . No drainage noted.         Assessment & Plan:  Onychomycosis  B/L  Consent by patient was obtained for treatment procedures. The patient understood the discussion of the treatment and procedures well. All questions were answered thoroughly reviewed. Debridement of mycotic and hypertrophic nails 1 through 5 bilaterally was performed. No ulceration and no infection noted. Return to the clinic in 3 months for continued evaluation and treatment  Helane GuntherGregory Hava Massingale DPM

## 2016-12-31 ENCOUNTER — Telehealth: Payer: Self-pay

## 2016-12-31 NOTE — Telephone Encounter (Signed)
Called [facility] no record in either system for patient having Pneumonia in their records.

## 2017-01-01 NOTE — Telephone Encounter (Signed)
Notify pt there is no record at [facility] of pneumonia vaccine.  It did not receive anywhere else, she needs prevnar.

## 2017-01-01 NOTE — Telephone Encounter (Signed)
Pt informed. Lab appt made next week for injection.

## 2017-01-10 ENCOUNTER — Ambulatory Visit (INDEPENDENT_AMBULATORY_CARE_PROVIDER_SITE_OTHER): Payer: PPO | Admitting: *Deleted

## 2017-01-10 DIAGNOSIS — Z23 Encounter for immunization: Secondary | ICD-10-CM | POA: Diagnosis not present

## 2017-01-10 NOTE — Progress Notes (Signed)
Patient presented for Pneumonia vaccine Prevnar 13. Given IM in left arm patient voiced no concerns nor showed any sign of distress during injection.

## 2017-02-13 IMAGING — CR DG FOOT COMPLETE 3+V*R*
1 series · 3 of 3 positions shown · non-contrast
Comparison: None.

CLINICAL DATA: Right foot injury, acute pain and bruising.

EXAM:
RIGHT FOOT COMPLETE - 3+ VIEW

[Series 1: ap · 0.17mm/px · 3 of 3 slices shown]
[im 1/3]
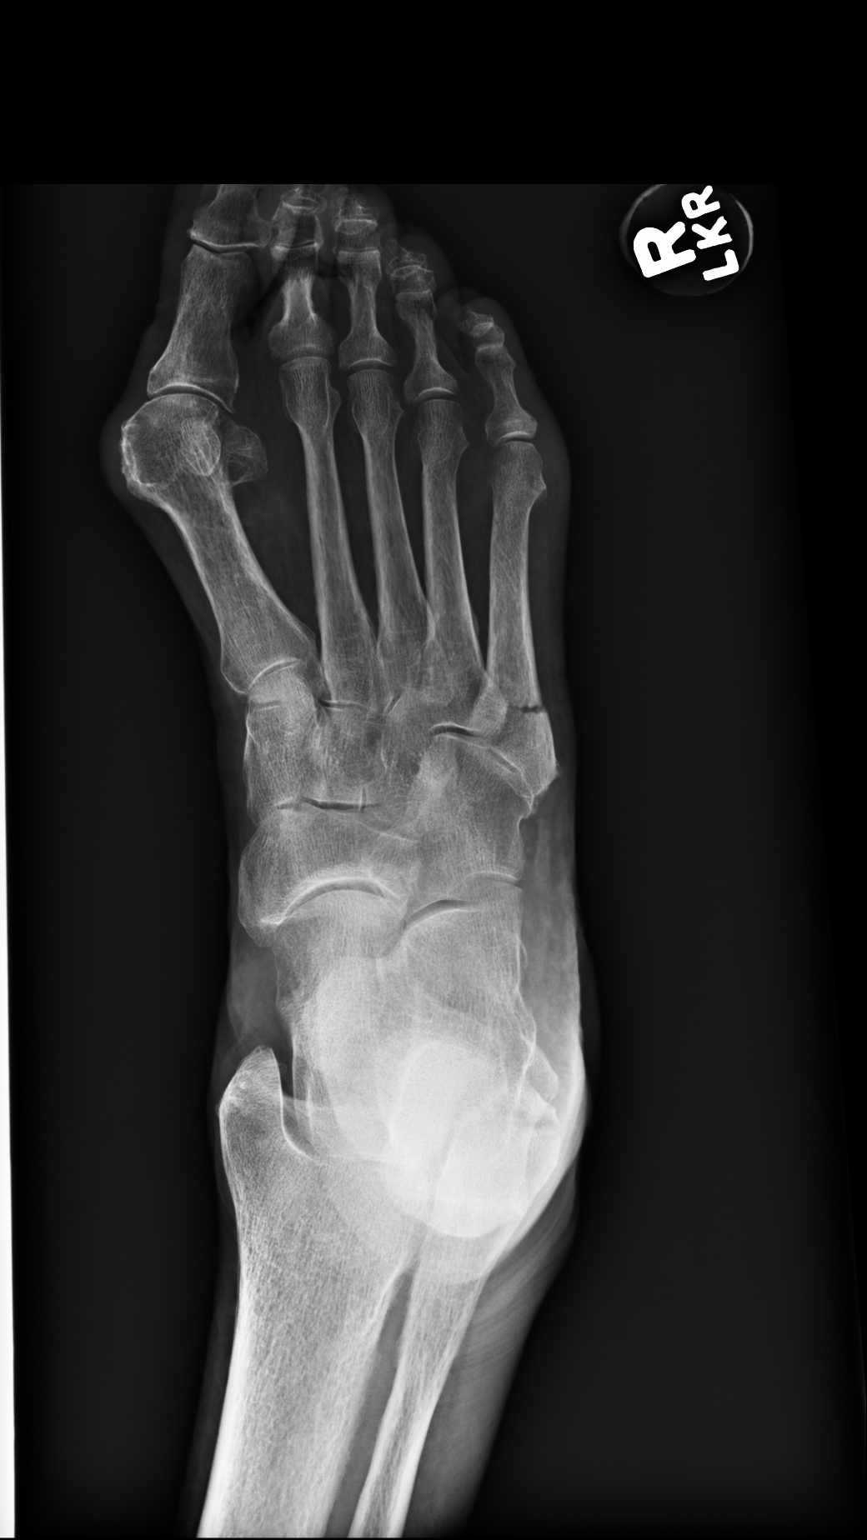
[im 2/3]
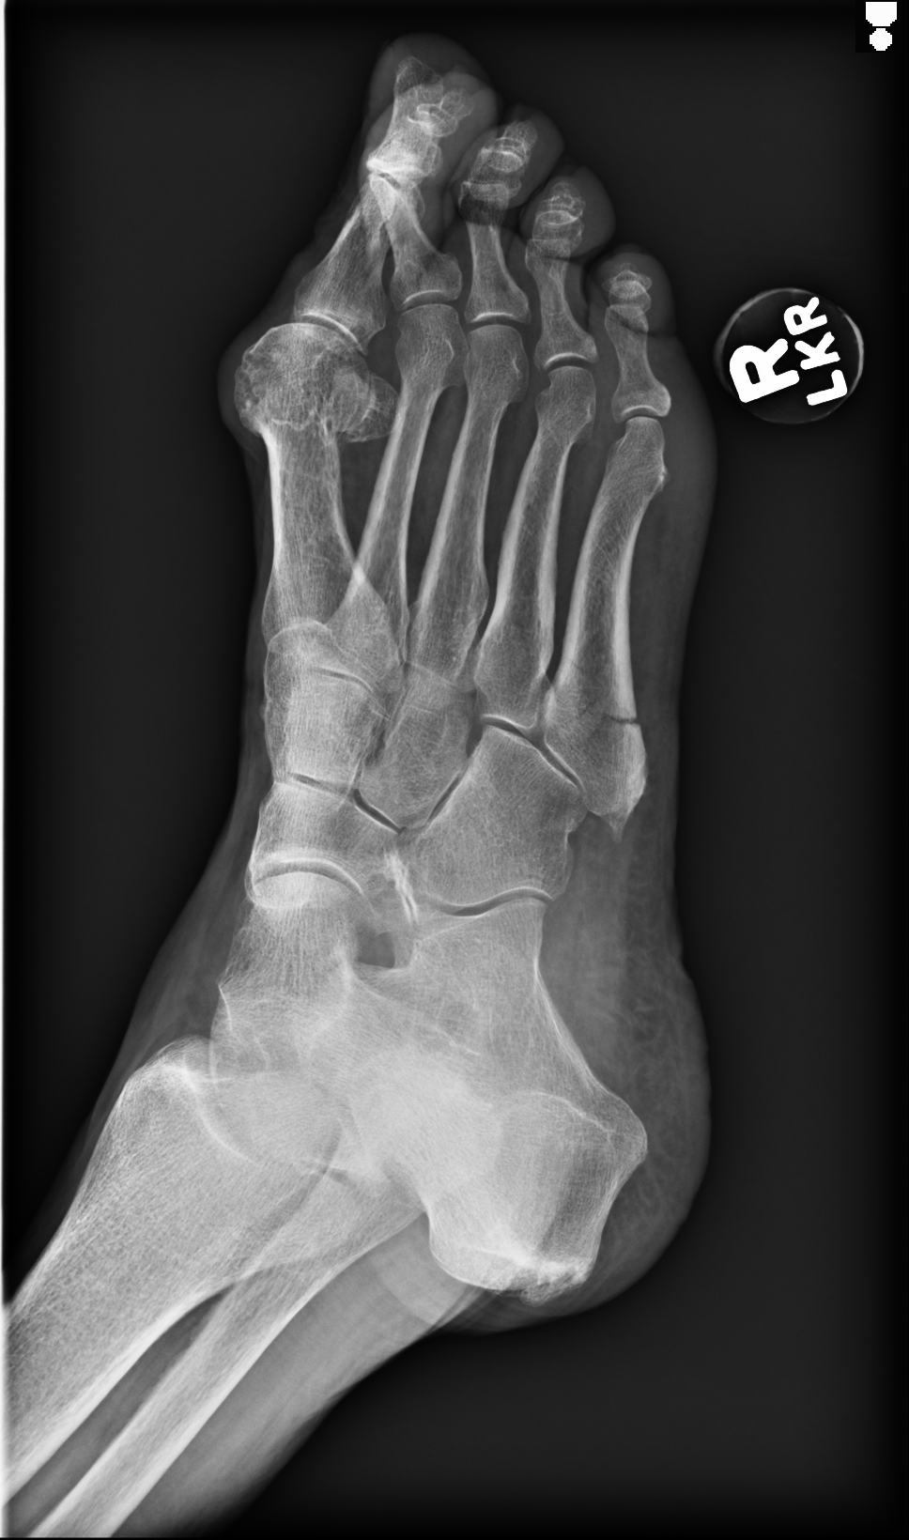
[im 3/3]
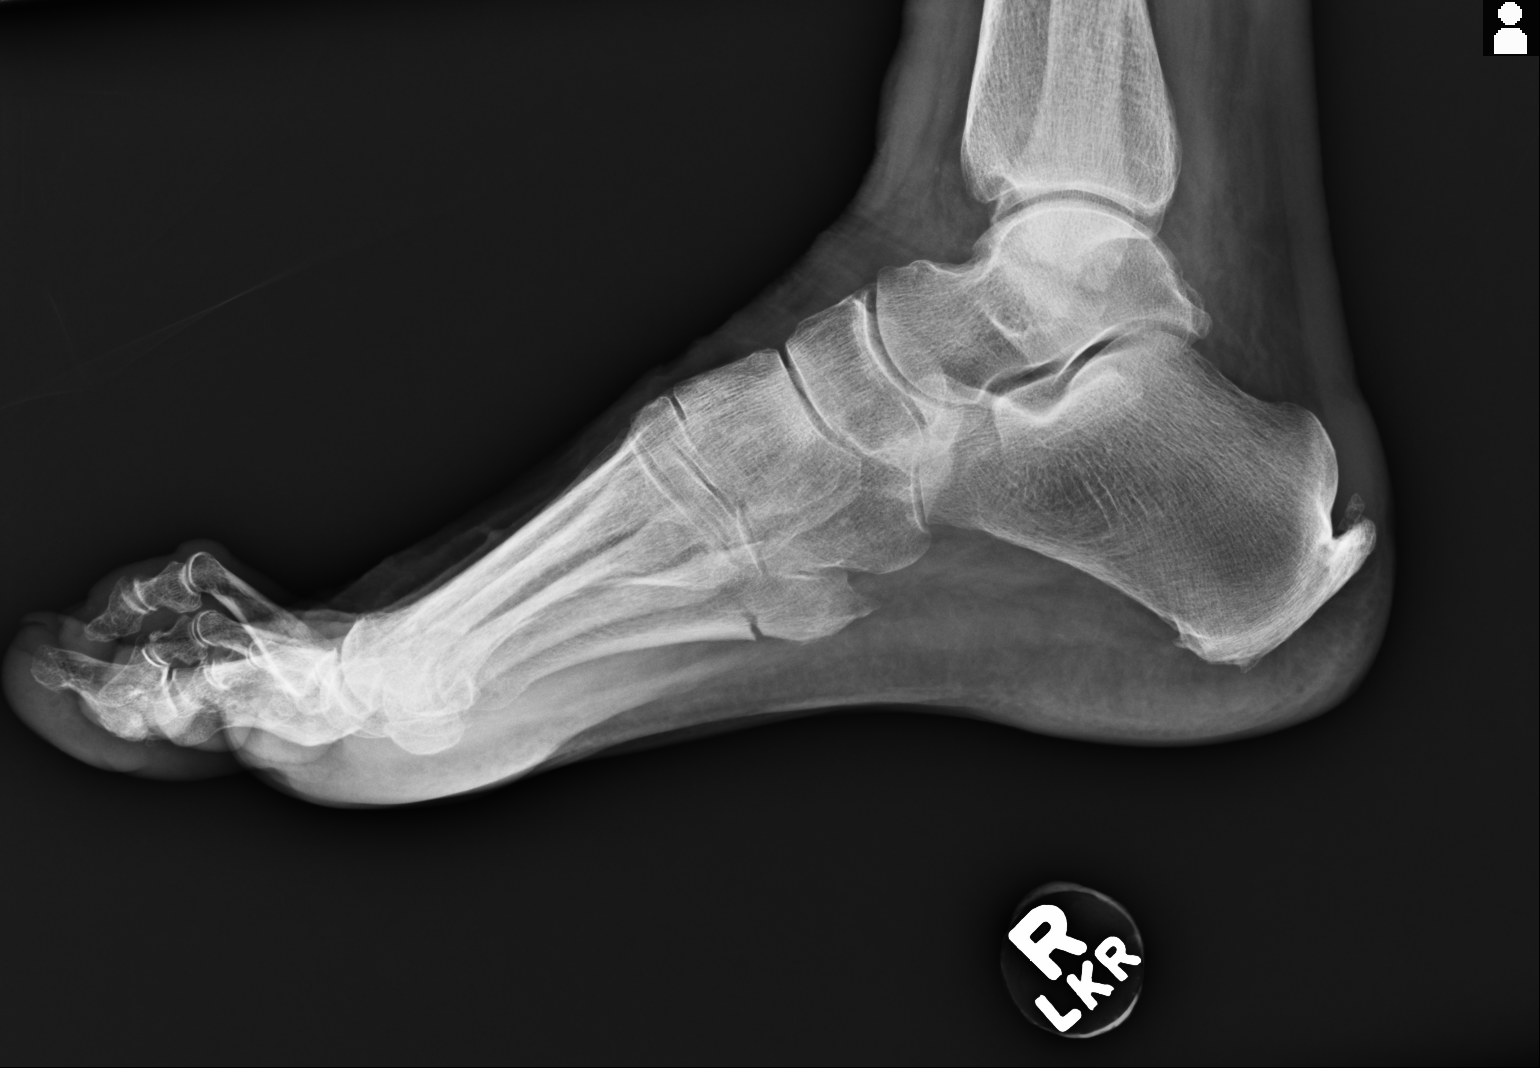

[3 of 3 positions shown; findings below may reference images not displayed]

FINDINGS: There is an acute nondisplaced fracture of the right fifth
metatarsal base. Bones are osteopenic.

Normal alignment. Right first MTP joint osteoarthritis noted with
associated bunion and hallux valgus deformity.

On the lateral view, there is also a nondisplaced acute fracture of
the right ankle lateral malleolus.
IMPRESSION: Acute nondisplaced fractures of the right fifth metatarsal base and
the right ankle lateral malleolus.

## 2017-02-13 IMAGING — CR DG ANKLE COMPLETE 3+V*R*
1 series · 3 of 3 positions shown · non-contrast
Comparison: None.

CLINICAL DATA: Injury 2 right foot when rising from sitting to
standing. Bruising over ankle.

EXAM:
RIGHT ANKLE - COMPLETE 3+ VIEW

[Series 1: ap · 0.17mm/px · 3 of 3 slices shown]
[im 1/3]
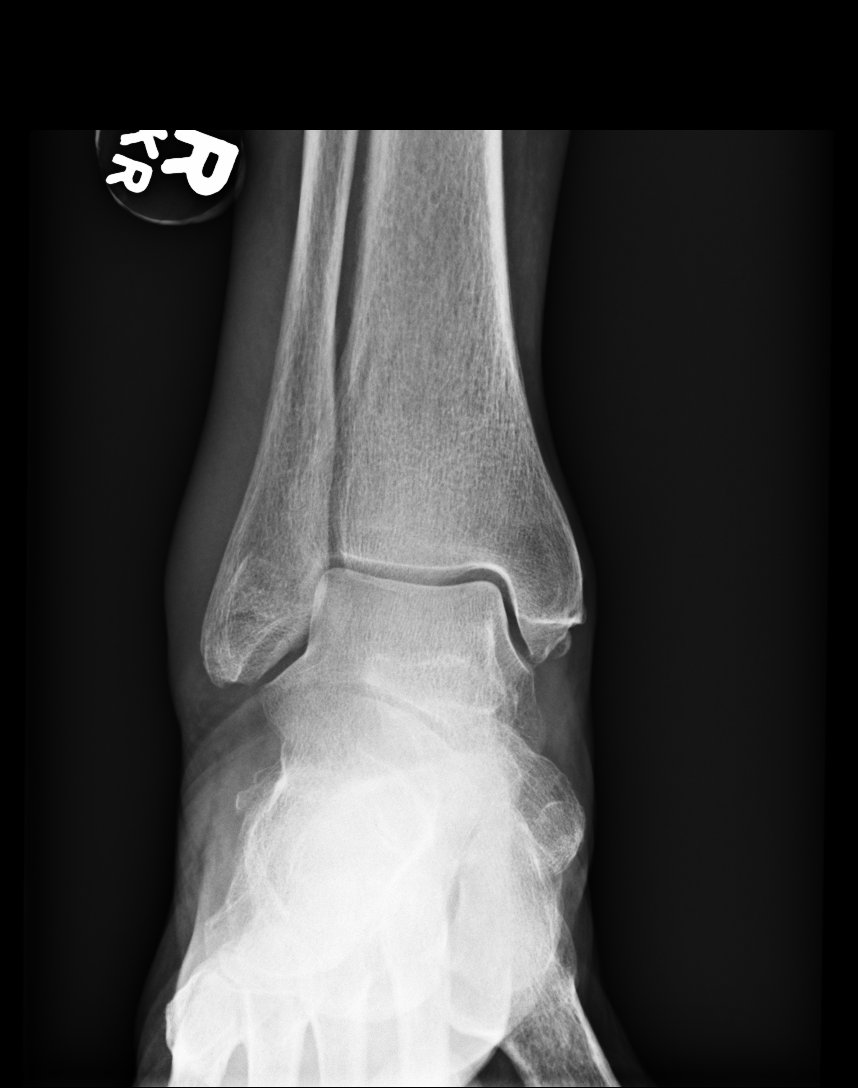
[im 2/3]
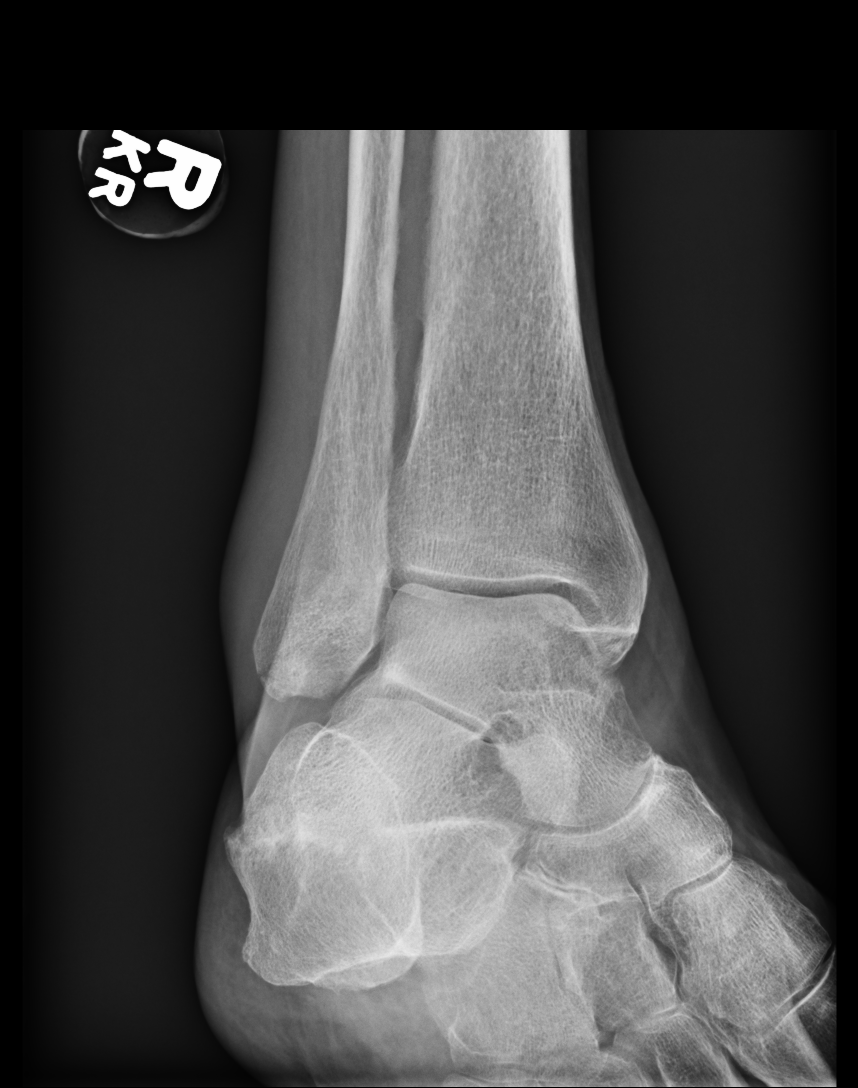
[im 3/3]
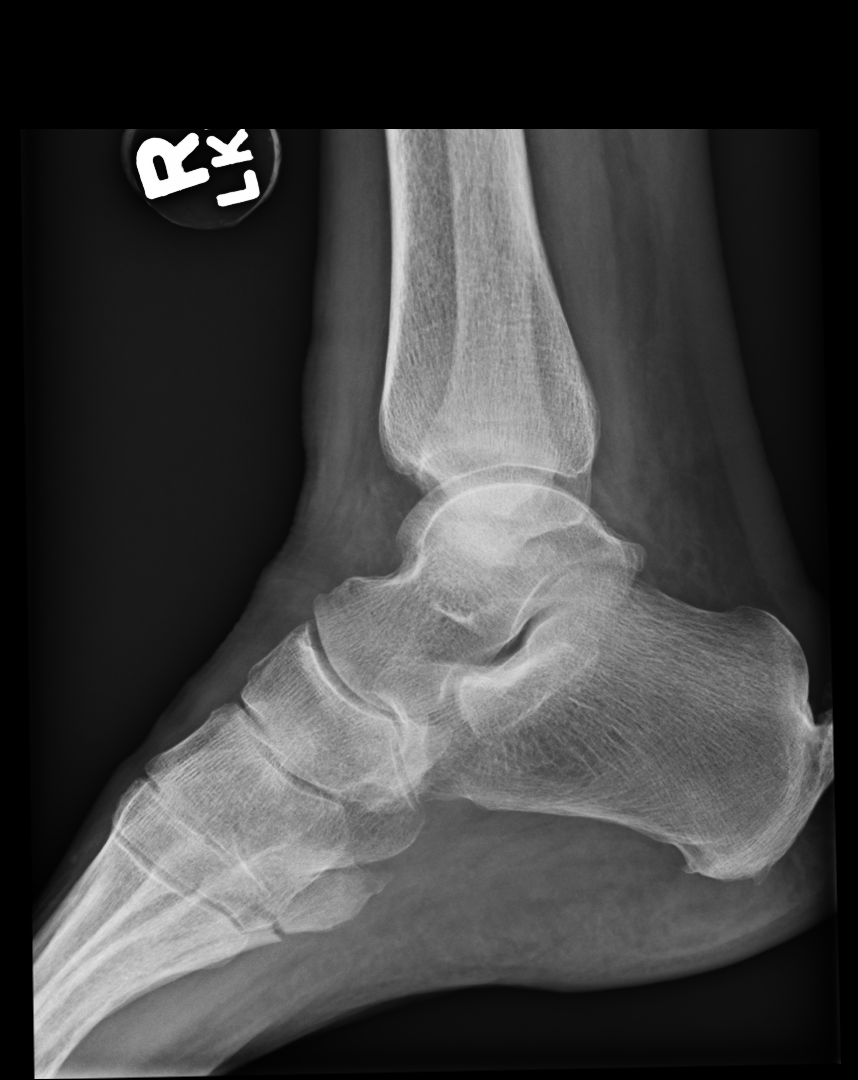

[3 of 3 positions shown; findings below may reference images not displayed]

FINDINGS: Examination demonstrates a subtle nondisplaced fracture over the tip
of the fibula cannot exclude a small chip fracture adjacent the
medial malleolus. Ankle mortise is normal. Mild soft tissue swelling
over the lateral ankle. Displaced transverse fracture of the base of
the fifth metatarsal.
IMPRESSION: Nondisplaced transverse fracture over the distal tip of the fibula.
Displaced transverse fracture of the base of the fifth metatarsal.
Possible chip fracture over the medial malleolus.

## 2017-03-04 DIAGNOSIS — H40053 Ocular hypertension, bilateral: Secondary | ICD-10-CM | POA: Diagnosis not present

## 2017-03-25 ENCOUNTER — Ambulatory Visit: Payer: PPO | Admitting: Podiatry

## 2017-04-16 ENCOUNTER — Other Ambulatory Visit (INDEPENDENT_AMBULATORY_CARE_PROVIDER_SITE_OTHER): Payer: PPO

## 2017-04-16 DIAGNOSIS — E7800 Pure hypercholesterolemia, unspecified: Secondary | ICD-10-CM | POA: Diagnosis not present

## 2017-04-16 DIAGNOSIS — I10 Essential (primary) hypertension: Secondary | ICD-10-CM | POA: Diagnosis not present

## 2017-04-16 LAB — BASIC METABOLIC PANEL
BUN: 13 mg/dL (ref 6–23)
CALCIUM: 9.8 mg/dL (ref 8.4–10.5)
CO2: 30 meq/L (ref 19–32)
Chloride: 102 mEq/L (ref 96–112)
Creatinine, Ser: 0.6 mg/dL (ref 0.40–1.20)
GFR: 99.84 mL/min (ref >60.00)
Glucose, Bld: 95 mg/dL (ref 70–99)
Potassium: 3.7 mEq/L (ref 3.5–5.1)
SODIUM: 138 meq/L (ref 135–145)

## 2017-04-16 LAB — HEPATIC FUNCTION PANEL
ALT: 18 U/L (ref 0–35)
AST: 20 U/L (ref 0–37)
Albumin: 4.2 g/dL (ref 3.5–5.2)
Alkaline Phosphatase: 58 U/L (ref 39–117)
BILIRUBIN DIRECT: 0.1 mg/dL (ref 0.0–0.3)
Total Bilirubin: 0.7 mg/dL (ref 0.2–1.2)
Total Protein: 6.6 g/dL (ref 6.0–8.3)

## 2017-04-16 LAB — LIPID PANEL
CHOL/HDL RATIO: 3
CHOLESTEROL: 174 mg/dL (ref 0–200)
HDL: 61.3 mg/dL (ref >39.00)
LDL Cholesterol: 88 mg/dL (ref 0–99)
NonHDL: 112.36
TRIGLYCERIDES: 120 mg/dL (ref 0.0–149.0)
VLDL: 24 mg/dL (ref 0.0–40.0)

## 2017-04-17 ENCOUNTER — Encounter: Payer: Self-pay | Admitting: Internal Medicine

## 2017-04-18 ENCOUNTER — Encounter: Payer: Self-pay | Admitting: Internal Medicine

## 2017-04-18 ENCOUNTER — Ambulatory Visit (INDEPENDENT_AMBULATORY_CARE_PROVIDER_SITE_OTHER): Payer: PPO | Admitting: Internal Medicine

## 2017-04-18 DIAGNOSIS — I700 Atherosclerosis of aorta: Secondary | ICD-10-CM | POA: Diagnosis not present

## 2017-04-18 DIAGNOSIS — K219 Gastro-esophageal reflux disease without esophagitis: Secondary | ICD-10-CM | POA: Diagnosis not present

## 2017-04-18 DIAGNOSIS — R945 Abnormal results of liver function studies: Secondary | ICD-10-CM | POA: Diagnosis not present

## 2017-04-18 DIAGNOSIS — M549 Dorsalgia, unspecified: Secondary | ICD-10-CM

## 2017-04-18 DIAGNOSIS — M810 Age-related osteoporosis without current pathological fracture: Secondary | ICD-10-CM

## 2017-04-18 DIAGNOSIS — Z9109 Other allergy status, other than to drugs and biological substances: Secondary | ICD-10-CM

## 2017-04-18 DIAGNOSIS — I10 Essential (primary) hypertension: Secondary | ICD-10-CM | POA: Diagnosis not present

## 2017-04-18 DIAGNOSIS — E7800 Pure hypercholesterolemia, unspecified: Secondary | ICD-10-CM | POA: Diagnosis not present

## 2017-04-18 DIAGNOSIS — R7989 Other specified abnormal findings of blood chemistry: Secondary | ICD-10-CM

## 2017-04-18 MED ORDER — LOSARTAN POTASSIUM 50 MG PO TABS: 50.0000 mg | ORAL_TABLET | Freq: Every day | ORAL | 5 refills | Status: DC

## 2017-04-18 NOTE — Progress Notes (Signed)
Pre visit review using our clinic review tool, if applicable. No additional management support is needed unless otherwise documented below in the visit note. 

## 2017-04-18 NOTE — Patient Instructions (Signed)
Zantac (ranitidine) 150mg  - take one tablet 30 minutes before lunch

## 2017-04-18 NOTE — Progress Notes (Signed)
Patient ID: Janet Moran, female   DOB: 02-25-1926, 84 y.o.   MRN: [id number]   Subjective:    Patient ID: Janet Moran, female    DOB: 02-25-1926, 84 y.o.   MRN: [id number]  HPI  Patient here for a scheduled follow up.  She reports she is doing relatively well.  Tries to stay active.  Some fatigue.  States "nothing out of the ordinary".  Trying to start walking more.  No chest pain.  Breathing stable. Minimal acid reflux.  No abdominal pain or cramping.  Back stable.  Occasionally uses a rice pad.  Overall she feels she is doing well.  Desires not to have a mammogram.    Past Medical History:  Diagnosis Date  . Arthritis   . Chicken pox   . GERD (gastroesophageal reflux disease)   . Glaucoma   . Hypercholesterolemia   . Hypertension    Past Surgical History:  Procedure Laterality Date  . ABDOMINAL HYSTERECTOMY  1976  . TONSILLECTOMY AND ADENOIDECTOMY  1935   Family History  Problem Relation Age of Onset  . Lung cancer Sister        died [id number]  . Stroke Mother   . Stroke Father   . Diabetes Maternal Grandmother   . Diabetes Maternal Grandfather   . Breast cancer Maternal Aunt        50's  . Breast cancer Daughter    Social History   Socioeconomic History  . Marital status: Married    Spouse name: None  . Number of children: 5  . Years of education: None  . Highest education level: None  Social Needs  . Financial resource strain: None  . Food insecurity - worry: None  . Food insecurity - inability: None  . Transportation needs - medical: None  . Transportation needs - non-medical: None  Occupational History  . None  Tobacco Use  . Smoking status: Never Smoker  . Smokeless tobacco: Never Used  Substance and Sexual Activity  . Alcohol use: No    Alcohol/week: 0.0 oz  . Drug use: No  . Sexual activity: No  Other Topics Concern  . None  Social History Narrative  . None    Outpatient Encounter Medications as of 04/18/2017  Medication Sig  . amLODipine  (NORVASC) 2.5 MG tablet Take 1 tablet (2.5 mg total) by mouth daily.  Marland Kitchen. aspirin EC 81 MG tablet Take 81 mg by mouth daily.  Marland Kitchen. azelastine (ASTELIN) 0.1 % nasal spray Place 1 spray into both nostrils 2 (two) times daily. Use in each nostril as directed  . calcium carbonate (OS-CAL) 600 MG TABS Take 600 mg by mouth 2 (two) times daily with a meal. Take 1 tablet by mouth twice daily with food  . latanoprost (XALATAN) 0.005 % ophthalmic solution Place 1 drop into both eyes at bedtime.  Marland Kitchen. losartan (COZAAR) 50 MG tablet Take 1 tablet (50 mg total) by mouth daily.  . metoprolol succinate (TOPROL-XL) 25 MG 24 hr tablet TAKE ONE (1) TABLET EACH DAY  . naproxen sodium (ANAPROX) 220 MG tablet Take 220 mg by mouth 2 (two) times daily with a meal.  . omeprazole (PRILOSEC) 20 MG capsule Take 20 mg by mouth daily.  . Probiotic Product (PROBIOTIC DAILY) CAPS Take 1 capsule by mouth daily.  . simvastatin (ZOCOR) 20 MG tablet TAKE ONE TABLET EACH EVENING AS DIRECTED  . timolol (TIMOPTIC) 0.5 % ophthalmic solution   . [DISCONTINUED] losartan (COZAAR) 50 MG tablet Take 1  tablet (50 mg total) by mouth daily.   Facility-Administered Encounter Medications as of 04/18/2017  Medication  . cyanocobalamin ((VITAMIN B-12)) injection 1,000 mcg    Review of Systems  Constitutional: Negative for appetite change and unexpected weight change.  HENT: Negative for congestion and sinus pressure.   Respiratory: Negative for cough, chest tightness and shortness of breath.   Cardiovascular: Negative for chest pain, palpitations and leg swelling.  Gastrointestinal: Negative for abdominal pain, diarrhea, nausea and vomiting.  Genitourinary: Negative for difficulty urinating and dysuria.  Musculoskeletal: Positive for back pain. Negative for joint swelling.  Skin: Negative for color change and rash.  Neurological: Negative for dizziness, light-headedness and headaches.  Psychiatric/Behavioral: Negative for agitation and dysphoric  mood.       Objective:     Blood pressure rechecked by me:  138/62  Physical Exam  Constitutional: She appears well-developed and well-nourished. No distress.  HENT:  Nose: Nose normal.  Mouth/Throat: Oropharynx is clear and moist.  Neck: Neck supple. No thyromegaly present.  Cardiovascular: Normal rate and regular rhythm.  Pulmonary/Chest: Breath sounds normal. No respiratory distress. She has no wheezes.  Abdominal: Soft. Bowel sounds are normal. There is no tenderness.  Musculoskeletal: She exhibits no edema or tenderness.  Lymphadenopathy:    She has no cervical adenopathy.  Skin: No rash noted. No erythema.  Psychiatric: She has a normal mood and affect. Her behavior is normal.    BP 138/62   Pulse 63   Temp 97.7 F (36.5 C) (Oral)   Ht 5\' 5"  (1.651 m)   Wt 146 lb 6.4 oz (66.4 kg)   SpO2 98%   BMI 24.36 kg/m  Wt Readings from Last 3 Encounters:  04/18/17 146 lb 6.4 oz (66.4 kg)  12/07/16 142 lb 9.6 oz (64.7 kg)  10/19/16 143 lb (64.9 kg)     Lab Results  Component Value Date   WBC 4.5 11/26/2016   HGB 14.8 11/26/2016   HCT 43.8 11/26/2016   PLT 197.0 11/26/2016   GLUCOSE 95 04/16/2017   CHOL 174 04/16/2017   TRIG 120.0 04/16/2017   HDL 61.30 04/16/2017   LDLCALC 88 04/16/2017   ALT 18 04/16/2017   AST 20 04/16/2017   NA 138 04/16/2017   K 3.7 04/16/2017   CL 102 04/16/2017   CREATININE 0.60 04/16/2017   BUN 13 04/16/2017   CO2 30 04/16/2017   TSH 4.22 11/26/2016    Ct Abdomen Pelvis W Contrast  Result Date: 09/13/2016 CLINICAL DATA:  Diarrhea for 3 weeks. EXAM: CT ABDOMEN AND PELVIS WITH CONTRAST TECHNIQUE: Multidetector CT imaging of the abdomen and pelvis was performed using the standard protocol following bolus administration of intravenous contrast. CONTRAST:  ISOVUE-300 IOPAMIDOL (ISOVUE-300) INJECTION 61% COMPARISON:  09/20/2010. FINDINGS: Lower chest: Lung bases show no acute findings. Heart is mildly enlarged. No pericardial or pleural  effusion. Small hiatal hernia. Hepatobiliary: Subcentimeter low-attenuation lesion in the dome of the liver is too small to characterize but likely a benign cyst. Liver and gallbladder are otherwise unremarkable. No biliary ductal dilatation. Pancreas: Negative. Spleen: Negative. Adrenals/Urinary Tract: Adrenal glands and right kidney are unremarkable. There may be a small stone in the lower pole left kidney. Ureters are decompressed. Bladder is grossly unremarkable. Stomach/Bowel: Small hiatal hernia. Stomach, small bowel and colon are unremarkable. Appendix is not well-visualized. Vascular/Lymphatic: Atherosclerotic calcification of the arterial vasculature without abdominal aortic aneurysm. No pathologically enlarged lymph nodes. Reproductive: Hysterectomy.  No adnexal mass. Other: No free fluid. Mesenteries and peritoneum  are unremarkable. Small periumbilical hernia contains fat. Musculoskeletal: Degenerative changes in the spine. IMPRESSION: 1. No findings to explain the patient's diarrhea. 2. There may be a small stone in the lower pole left kidney. 3.  Aortic atherosclerosis (ICD10-170.0). Electronically Signed   By: Leanna Battles M.D.   On: 09/13/2016 14:39       Assessment & Plan:   Problem List Items Addressed This Visit    Abnormal liver function test   Aortic atherosclerosis (HCC)    On simvastatin.        Relevant Medications   losartan (COZAAR) 50 MG tablet   Back pain    Overall stable.  Does not feel needs any further intervention.  Follow.        Environmental allergies    Overall stable.  Continue current regimen.        GERD (gastroesophageal reflux disease)    Some minimal acid reflux.  Take zantac as directed.  Follow.  Notify me if persistent symptoms.         Hypercholesterolemia    On simvastatin.  Low cholesterol diet and exercise.  Follow lipid panel and liver function tests.        Relevant Medications   losartan (COZAAR) 50 MG tablet   Other Relevant  Orders   Hepatic function panel   Lipid panel   Hypertension   Relevant Medications   losartan (COZAAR) 50 MG tablet   Other Relevant Orders   CBC with Differential/Platelet   TSH   Basic metabolic panel   Osteoporosis    Have discussed treatment options.  She prefers not to start any prescription medication.  Check vitamin D level.        Relevant Orders   VITAMIN D 25 Hydroxy (Vit-D Deficiency, Fractures)       Dale Hustisford, MD

## 2017-04-21 ENCOUNTER — Encounter: Payer: Self-pay | Admitting: Internal Medicine

## 2017-04-21 NOTE — Assessment & Plan Note (Addendum)
Some minimal acid reflux.  Take zantac as directed.  Follow.  Notify me if persistent symptoms.

## 2017-04-21 NOTE — Assessment & Plan Note (Signed)
On simvastatin.   

## 2017-04-21 NOTE — Assessment & Plan Note (Signed)
Have discussed treatment options.  She prefers not to start any prescription medication.  Check vitamin D level.

## 2017-04-21 NOTE — Assessment & Plan Note (Signed)
On simvastatin.  Low cholesterol diet and exercise.  Follow lipid panel and liver function tests.   

## 2017-04-21 NOTE — Assessment & Plan Note (Signed)
Overall stable. Continue current regimen. 

## 2017-04-21 NOTE — Assessment & Plan Note (Signed)
Overall stable.  Does not feel needs any further intervention.  Follow.

## 2017-05-10 ENCOUNTER — Other Ambulatory Visit: Payer: Self-pay | Admitting: Internal Medicine

## 2017-06-25 ENCOUNTER — Encounter: Payer: Self-pay | Admitting: Internal Medicine

## 2017-07-03 ENCOUNTER — Ambulatory Visit: Payer: Self-pay

## 2017-07-03 ENCOUNTER — Telehealth: Payer: Self-pay | Admitting: Internal Medicine

## 2017-07-03 NOTE — Telephone Encounter (Signed)
Patient called for her c/o "rash."  She says "I noticed it a few weeks ago on my nose. I still wear makeup and it's not noticeable. I've been using hydrocortisone cream for the itch, which is mild. I have no pain. It covers my entire nose, small, red spots, not raised.  I've used this Kenolog cream in the past and wondered if I can get that." I advised she would need to be seen in the office. According to protocol, see PCP within 3 days, no availability with provider, appointment made for Friday, 07/05/17 at 1315 with Rennie PlowmanMargaret Arnett, FNP, care advice given, patient verbalized understanding.   Reason for Disposition . Localized rash present > 7 days  Answer Assessment - Initial Assessment Questions 1. APPEARANCE of RASH: "Describe the rash."      Red flat area on the nose 2. LOCATION: "Where is the rash located?"      Nose 3. NUMBER: "How many spots are there?"      A lot of small red dots 4. SIZE: "How big are the spots?" (Inches, centimeters or compare to size of a coin)      Pin sized 5. ONSET: "When did the rash start?"      A few weeks ago 6. ITCHING: "Does the rash itch?" If so, ask: "How bad is the itch?"  (Scale 1-10; or mild, moderate, severe)     Mild 7. PAIN: "Does the rash hurt?" If so, ask: "How bad is the pain?"  (Scale 1-10; or mild, moderate, severe)     No 8. OTHER SYMPTOMS: "Do you have any other symptoms?" (e.g., fever)     No 9. PREGNANCY: "Is there any chance you are pregnant?" "When was your last menstrual period?"     No  Protocols used: RASH OR REDNESS - LOCALIZED-A-AH

## 2017-07-03 NOTE — Telephone Encounter (Signed)
See triage encounter.

## 2017-07-03 NOTE — Telephone Encounter (Signed)
Copied from CRM [id number]. Topic: Quick Communication - See Telephone Encounter >> Jul 03, 2017  3:54 PM Arlyss Gandyichardson, Ravleen Ries N, NT wrote: CRM for notification. See Telephone encounter for: 07/03/17. Pt would like to see if she can have some triamcinolone cream (KENALOG) 0.1 % called in to her pharmacy for a rash on her nose. Uses [facility].

## 2017-07-05 ENCOUNTER — Ambulatory Visit (INDEPENDENT_AMBULATORY_CARE_PROVIDER_SITE_OTHER): Payer: PPO | Admitting: Family

## 2017-07-05 ENCOUNTER — Encounter: Payer: Self-pay | Admitting: Family

## 2017-07-05 VITALS — BP 110/60 | HR 65 | Temp 98.3°F | Resp 14 | Wt 145.5 lb

## 2017-07-05 DIAGNOSIS — R21 Rash and other nonspecific skin eruption: Secondary | ICD-10-CM | POA: Diagnosis not present

## 2017-07-05 MED ORDER — TRIAMCINOLONE ACETONIDE 0.025 % EX CREA: 1.0000 "application " | TOPICAL_CREAM | Freq: Two times a day (BID) | CUTANEOUS | 1 refills | Status: DC

## 2017-07-05 NOTE — Patient Instructions (Addendum)
If doesn't resolve or if new symptoms present, please let us know - we need to consult your dermatologist at that time.   Use steroid cream for shortest course possible as may cause lightening of the skin.   Have a great weekend!

## 2017-07-05 NOTE — Progress Notes (Signed)
Subjective:    Patient ID: Janet Moran, female    DOB: [birth year], 84 y.o.   MRN: [id number]  CC: Janet Moran is a 84 y.o. female who presents today for an acute visit.    HPI: CC: rash on end of nose, one month ago, worsening.  Would only notice it in the morning and at night when washing face and can see it in good lighting. Feels like spreading on cheeks.   No change of soap, detergent. No sun exposure.    HTN- compliant with medications. Denies exertional chest pain or pressure, numbness or tingling radiating to left arm or jaw, palpitations, dizziness, frequent headaches, changes in vision, or shortness of breath.  Had similar rash 2014 - started on triamcinolone cream.  HISTORY:  Past Medical  History:  Diagnosis Date  . Arthritis   . Chicken pox   . GERD (gastroesophageal reflux disease)   . Glaucoma   . Hypercholesterolemia   . Hypertension    Past Surgical History:  Procedure Laterality Date  . ABDOMINAL HYSTERECTOMY  1976  . TONSILLECTOMY AND ADENOIDECTOMY  1935   Family History  Problem Relation Age of Onset  . Lung cancer Sister        died [id number]  . Stroke Mother   . Stroke Father   . Diabetes Maternal Grandmother   . Diabetes Maternal Grandfather   . Breast cancer Maternal Aunt        50's  . Breast cancer Daughter     Allergies: Avelox [moxifloxacin]; Crestor [rosuvastatin]; Pravachol [pravastatin]; and Vytorin [ezetimibe-simvastatin] Current Outpatient Medications on File Prior to Visit  Medication Sig Dispense Refill  . amLODipine (NORVASC) 2.5 MG tablet Take 1 tablet (2.5 mg total) by mouth daily. 30 tablet 5  . aspirin EC 81 MG tablet Take 81 mg by mouth daily.    Marland Kitchen. azelastine (ASTELIN) 0.1 % nasal spray Place 1 spray into both nostrils 2 (two) times daily. Use in each nostril as directed 30 mL 2  . calcium carbonate (OS-CAL) 600 MG TABS Take 600 mg by mouth 2 (two) times daily with a meal. Take 1 tablet by mouth twice daily with food    . latanoprost (XALATAN) 0.005 % ophthalmic solution Place 1 drop into both eyes at bedtime.    Marland Kitchen. losartan (COZAAR) 50 MG tablet Take 1 tablet (50 mg total) by mouth daily. 30 tablet 5  . metoprolol succinate (TOPROL-XL) 25 MG 24 hr tablet TAKE ONE (1) TABLET EACH DAY 30 tablet 5  . naproxen sodium (ANAPROX) 220 MG tablet Take 220 mg by mouth 2 (two) times daily with a meal.    . omeprazole (PRILOSEC) 20 MG capsule Take 20 mg by mouth daily.    . simvastatin (ZOCOR) 20 MG tablet TAKE ONE TABLET EACH EVENING AS DIRECTED 30 tablet 4  . timolol (TIMOPTIC) 0.5 % ophthalmic solution      Current Facility-Administered Medications on File Prior to Visit  Medication Dose Route Frequency Provider Last Rate Last Dose  .  cyanocobalamin ((VITAMIN B-12)) injection 1,000 mcg  1,000 mcg Intramuscular Once Janet DurhamScott, Charlene, MD        Social History   Tobacco Use  . Smoking status: Never Smoker  . Smokeless tobacco: Never Used  Substance Use Topics  . Alcohol use: No    Alcohol/week: 0.0 oz  . Drug use: No    Review of Systems  Constitutional: Negative for chills and fever.  Respiratory: Negative for cough.   Cardiovascular: Negative for chest pain and palpitations.  Gastrointestinal: Negative for nausea and vomiting.  Musculoskeletal: Negative for myalgias.  Skin: Positive for rash.      Objective:    BP 110/60   Pulse 65   Temp 98.3 F (36.8 C) (Oral)   Resp 14   Wt 145 lb 8 oz (66 kg)   SpO2 98%   BMI 24.21 kg/m   BP Readings from Last 3 Encounters:  07/05/17 110/60  04/21/17 138/62  12/07/16 130/64    Physical Exam  Constitutional: She appears well-developed and well-nourished.  Eyes: Conjunctivae are normal.  Cardiovascular: Normal rate, regular rhythm, normal heart sounds and normal pulses.  Pulmonary/Chest: Effort normal and breath sounds normal. She  has no wheezes. She has no rhonchi. She has no rales.  Neurological: She is alert.  Skin: Skin is warm and dry.  Several pin point discrete Macules red in color noted on brim of nose and cheeks. No papules, discharge, increase in warmth appreciated.   Psychiatric: She has a normal mood and affect. Her speech is normal and behavior is normal. Thought content normal.  Vitals reviewed.      Assessment & Plan:  1. Rash Well appearing.  Rashes responded to Kenalog in the past.  Suspect contact dermatitis.  Agree with patient it is reasonable to try this again.  Patient not having other symptoms to warrant rheumatology workup for malar rash.  Patient will let me know if no improvement at that time would advise further workup due location of rash, and failure to resolve with  Corticosteroids.   - triamcinolone (KENALOG) 0.025 % cream;  Apply 1 application topically 2 (two) times daily.  Dispense: 15 g; Refill: 1     I have discontinued Janet Belling. Moran's PROBIOTIC DAILY. I am also having her start on triamcinolone. Additionally, I am having her maintain her aspirin EC, omeprazole, latanoprost, calcium carbonate, naproxen sodium, timolol, amLODipine, azelastine, metoprolol succinate, losartan, and simvastatin. We will continue to administer cyanocobalamin.   Meds ordered this encounter  Medications  . triamcinolone (KENALOG) 0.025 % cream    Sig: Apply 1 application topically 2 (two) times daily.    Dispense:  15 g    Refill:  1    Order Specific Question:   Supervising Provider    Answer:   Sherlene Shams [[id number]]    Return precautions given.   Risks, benefits, and alternatives of the medications and treatment plan prescribed today were discussed, and patient expressed understanding.   Education regarding symptom management and diagnosis given to patient on AVS.  Continue to follow with Janet Woodstock, MD for routine health maintenance.   Janet Moran and I agreed with plan.   Rennie Plowman, FNP

## 2017-07-10 DIAGNOSIS — L718 Other rosacea: Secondary | ICD-10-CM | POA: Diagnosis not present

## 2017-08-01 ENCOUNTER — Other Ambulatory Visit: Payer: Self-pay | Admitting: Internal Medicine

## 2017-08-16 ENCOUNTER — Other Ambulatory Visit: Payer: Self-pay | Admitting: Internal Medicine

## 2017-08-21 ENCOUNTER — Other Ambulatory Visit: Payer: PPO

## 2017-08-23 ENCOUNTER — Encounter: Payer: PPO | Admitting: Internal Medicine

## 2017-09-05 DIAGNOSIS — H40053 Ocular hypertension, bilateral: Secondary | ICD-10-CM | POA: Diagnosis not present

## 2017-10-08 ENCOUNTER — Encounter: Payer: Self-pay | Admitting: Internal Medicine

## 2017-10-08 ENCOUNTER — Other Ambulatory Visit (INDEPENDENT_AMBULATORY_CARE_PROVIDER_SITE_OTHER): Payer: PPO

## 2017-10-08 DIAGNOSIS — M810 Age-related osteoporosis without current pathological fracture: Secondary | ICD-10-CM | POA: Diagnosis not present

## 2017-10-08 DIAGNOSIS — E7800 Pure hypercholesterolemia, unspecified: Secondary | ICD-10-CM | POA: Diagnosis not present

## 2017-10-08 DIAGNOSIS — I10 Essential (primary) hypertension: Secondary | ICD-10-CM | POA: Diagnosis not present

## 2017-10-08 LAB — CBC WITH DIFFERENTIAL/PLATELET
BASOS ABS: 0.1 10*3/uL (ref 0.0–0.1)
Basophils Relative: 1.2 % (ref 0.0–3.0)
EOS ABS: 0.1 10*3/uL (ref 0.0–0.7)
Eosinophils Relative: 2.8 % (ref 0.0–5.0)
HCT: 43.2 % (ref 36.0–46.0)
Hemoglobin: 14.8 g/dL (ref 12.0–15.0)
LYMPHS ABS: 1.4 10*3/uL (ref 0.7–4.0)
Lymphocytes Relative: 27.7 % (ref 12.0–46.0)
MCHC: 34.3 g/dL (ref 30.0–36.0)
MCV: 94.5 fl (ref 78.0–100.0)
MONO ABS: 0.5 10*3/uL (ref 0.1–1.0)
Monocytes Relative: 9.3 % (ref 3.0–12.0)
NEUTROS PCT: 59 % (ref 43.0–77.0)
Neutro Abs: 3.1 10*3/uL (ref 1.4–7.7)
Platelets: 213 10*3/uL (ref 150.0–400.0)
RBC: 4.57 Mil/uL (ref 3.87–5.11)
RDW: 13 % (ref 11.5–15.5)
WBC: 5.2 10*3/uL (ref 4.0–10.5)

## 2017-10-08 LAB — LIPID PANEL
Cholesterol: 187 mg/dL (ref 0–200)
HDL: 62.1 mg/dL (ref >39.00)
LDL Cholesterol: 99 mg/dL (ref 0–99)
NonHDL: 125.21
TRIGLYCERIDES: 132 mg/dL (ref 0.0–149.0)
Total CHOL/HDL Ratio: 3
VLDL: 26.4 mg/dL (ref 0.0–40.0)

## 2017-10-08 LAB — BASIC METABOLIC PANEL
BUN: 10 mg/dL (ref 6–23)
CO2: 28 mEq/L (ref 19–32)
CREATININE: 0.63 mg/dL (ref 0.40–1.20)
Calcium: 9.8 mg/dL (ref 8.4–10.5)
Chloride: 101 mEq/L (ref 96–112)
GFR: 94.27 mL/min (ref >60.00)
GLUCOSE: 99 mg/dL (ref 70–99)
Potassium: 3.9 mEq/L (ref 3.5–5.1)
Sodium: 136 mEq/L (ref 135–145)

## 2017-10-08 LAB — HEPATIC FUNCTION PANEL
ALBUMIN: 4.1 g/dL (ref 3.5–5.2)
ALK PHOS: 61 U/L (ref 39–117)
ALT: 13 U/L (ref 0–35)
AST: 16 U/L (ref 0–37)
Bilirubin, Direct: 0.1 mg/dL (ref 0.0–0.3)
TOTAL PROTEIN: 6.7 g/dL (ref 6.0–8.3)
Total Bilirubin: 0.7 mg/dL (ref 0.2–1.2)

## 2017-10-08 LAB — VITAMIN D 25 HYDROXY (VIT D DEFICIENCY, FRACTURES): VITD: 33.27 ng/mL (ref 30.00–100.00)

## 2017-10-08 LAB — TSH: TSH: 3.26 u[IU]/mL (ref 0.35–4.50)

## 2017-10-09 ENCOUNTER — Other Ambulatory Visit: Payer: Self-pay | Admitting: Internal Medicine

## 2017-10-11 ENCOUNTER — Ambulatory Visit (INDEPENDENT_AMBULATORY_CARE_PROVIDER_SITE_OTHER): Payer: PPO | Admitting: Internal Medicine

## 2017-10-11 ENCOUNTER — Ambulatory Visit (INDEPENDENT_AMBULATORY_CARE_PROVIDER_SITE_OTHER): Payer: PPO

## 2017-10-11 VITALS — BP 128/64 | HR 65 | Temp 98.0°F | Resp 18 | Ht 65.0 in | Wt 144.6 lb

## 2017-10-11 DIAGNOSIS — R42 Dizziness and giddiness: Secondary | ICD-10-CM

## 2017-10-11 DIAGNOSIS — M5136 Other intervertebral disc degeneration, lumbar region: Secondary | ICD-10-CM | POA: Diagnosis not present

## 2017-10-11 DIAGNOSIS — I700 Atherosclerosis of aorta: Secondary | ICD-10-CM | POA: Diagnosis not present

## 2017-10-11 DIAGNOSIS — X32XXXA Exposure to sunlight, initial encounter: Secondary | ICD-10-CM | POA: Diagnosis not present

## 2017-10-11 DIAGNOSIS — E7800 Pure hypercholesterolemia, unspecified: Secondary | ICD-10-CM

## 2017-10-11 DIAGNOSIS — H6123 Impacted cerumen, bilateral: Secondary | ICD-10-CM

## 2017-10-11 DIAGNOSIS — K219 Gastro-esophageal reflux disease without esophagitis: Secondary | ICD-10-CM

## 2017-10-11 DIAGNOSIS — Z0000 Encounter for general adult medical examination without abnormal findings: Secondary | ICD-10-CM | POA: Diagnosis not present

## 2017-10-11 DIAGNOSIS — Z9109 Other allergy status, other than to drugs and biological substances: Secondary | ICD-10-CM | POA: Diagnosis not present

## 2017-10-11 DIAGNOSIS — I10 Essential (primary) hypertension: Secondary | ICD-10-CM

## 2017-10-11 DIAGNOSIS — M549 Dorsalgia, unspecified: Secondary | ICD-10-CM

## 2017-10-11 DIAGNOSIS — L570 Actinic keratosis: Secondary | ICD-10-CM | POA: Diagnosis not present

## 2017-10-11 DIAGNOSIS — M4186 Other forms of scoliosis, lumbar region: Secondary | ICD-10-CM | POA: Diagnosis not present

## 2017-10-11 DIAGNOSIS — M47814 Spondylosis without myelopathy or radiculopathy, thoracic region: Secondary | ICD-10-CM | POA: Diagnosis not present

## 2017-10-11 DIAGNOSIS — L821 Other seborrheic keratosis: Secondary | ICD-10-CM | POA: Diagnosis not present

## 2017-10-11 DIAGNOSIS — M4184 Other forms of scoliosis, thoracic region: Secondary | ICD-10-CM | POA: Diagnosis not present

## 2017-10-11 DIAGNOSIS — L718 Other rosacea: Secondary | ICD-10-CM | POA: Diagnosis not present

## 2017-10-11 MED ORDER — AZELASTINE HCL 0.1 % NA SOLN: 1.0000 | Freq: Two times a day (BID) | NASAL | 12 refills | Status: DC

## 2017-10-11 MED ORDER — CARBAMIDE PEROXIDE 6.5 % OT SOLN: OTIC | 0 refills | Status: DC

## 2017-10-11 NOTE — Progress Notes (Signed)
Patient ID: Janet Moran, female   DOB: 08/18/1925, 84 y.o.   MRN: [id number]   Subjective:    Patient ID: Janet Moran, female    DOB: 07-22-1925, 84 y.o.   MRN: [id number]  HPI  Patient here for her physical exam.  She reports she is doing relatively well.  Is still having low back pain.  When she is sitting, does not hurt.  Does hurt when she stands or has been standing. Can hurt up into her shoulder.  Takes alleve one per day.  She also reports some persistent intermittent light headedness.  No significant dizziness.  No headache.  Is concerned regarding ear wax.  No sinus pressure.  Some allergy symptoms.  No chest pain.  No sob.  No acid reflux.  No abdominal pain.  Bowels moving.     Past Medical History:  Diagnosis Date  . Arthritis   . Chicken pox   . GERD (gastroesophageal reflux disease)   . Glaucoma   . Hypercholesterolemia   . Hypertension    Past Surgical History:  Procedure Laterality Date  . ABDOMINAL HYSTERECTOMY  1976  . TONSILLECTOMY AND ADENOIDECTOMY  1935   Family History  Problem Relation Age of Onset  . Lung cancer Sister        died 82  . Stroke Mother   . Stroke Father   . Diabetes Maternal Grandmother   . Diabetes Maternal Grandfather   . Breast cancer Maternal Aunt        50's  . Breast cancer Daughter    Social History   Socioeconomic History  . Marital status: Married    Spouse name: Not on file  . Number of children: 5  . Years of education: Not on file  . Highest education level: Not on file  Occupational History  . Not on file  Social Needs  . Financial resource strain: Not on file  . Food insecurity:    Worry: Not on file    Inability: Not on file  . Transportation needs:    Medical: Not on file    Non-medical: Not on file  Tobacco Use  . Smoking status: Never Smoker  . Smokeless tobacco: Never Used  Substance and Sexual Activity  . Alcohol use: No    Alcohol/week: 0.0 oz  . Drug use: No  . Sexual activity: Never    Lifestyle  . Physical activity:    Days per week: Not on file    Minutes per session: Not on file  . Stress: Not on file  Relationships  . Social connections:    Talks on phone: Not on file    Gets together: Not on file    Attends religious service: Not on file    Active member of club or organization: Not on file    Attends meetings of clubs or organizations: Not on file    Relationship status: Not on file  Other Topics Concern  . Not on file  Social History Narrative  . Not on file    Outpatient Encounter Medications as of 10/11/2017  Medication Sig  . amLODipine (NORVASC) 2.5 MG tablet Take 1 tablet (2.5 mg total) by mouth daily.  Marland Kitchen aspirin EC 81 MG tablet Take 81 mg by mouth daily.  Marland Kitchen azelastine (ASTELIN) 0.1 % nasal spray Place 1 spray into both nostrils 2 (two) times daily. Use in each nostril as directed  . calcium carbonate (OS-CAL) 600 MG TABS Take 600 mg by mouth 2 (two)  times daily with a meal. Take 1 tablet by mouth twice daily with food  . carbamide peroxide (DEBROX) 6.5 % OTIC solution Apply 3-4 drops in each ear.  Massage for approximately 5 minutes - each ear  . latanoprost (XALATAN) 0.005 % ophthalmic solution Place 1 drop into both eyes at bedtime.  Marland Kitchen losartan (COZAAR) 50 MG tablet Take 1 tablet (50 mg total) by mouth daily.  . metoprolol succinate (TOPROL-XL) 25 MG 24 hr tablet TAKE ONE (1) TABLET EACH DAY  . naproxen sodium (ANAPROX) 220 MG tablet Take 220 mg by mouth 2 (two) times daily with a meal.  . omeprazole (PRILOSEC) 20 MG capsule Take 20 mg by mouth daily.  . simvastatin (ZOCOR) 20 MG tablet TAKE ONE TABLET EACH EVENING AS DIRECTED  . timolol (TIMOPTIC) 0.5 % ophthalmic solution   . triamcinolone (KENALOG) 0.025 % cream Apply 1 application topically 2 (two) times daily.  . [DISCONTINUED] azelastine (ASTELIN) 0.1 % nasal spray Place 1 spray into both nostrils 2 (two) times daily. Use in each nostril as directed  . [DISCONTINUED] metoprolol succinate  (TOPROL-XL) 25 MG 24 hr tablet TAKE ONE (1) TABLET EACH DAY   Facility-Administered Encounter Medications as of 10/11/2017  Medication  . cyanocobalamin ((VITAMIN B-12)) injection 1,000 mcg    Review of Systems  Constitutional: Negative for appetite change and unexpected weight change.  HENT: Positive for congestion and postnasal drip. Negative for sinus pressure.   Eyes: Negative for pain and visual disturbance.  Respiratory: Negative for cough, chest tightness and shortness of breath.   Cardiovascular: Negative for chest pain, palpitations and leg swelling.  Gastrointestinal: Negative for abdominal pain, diarrhea, nausea and vomiting.  Genitourinary: Negative for difficulty urinating and dysuria.  Musculoskeletal: Positive for back pain. Negative for joint swelling and myalgias.  Skin: Negative for color change and rash.  Neurological: Positive for light-headedness. Negative for dizziness and headaches.  Hematological: Negative for adenopathy. Does not bruise/bleed easily.  Psychiatric/Behavioral: Negative for agitation and dysphoric mood.       Objective:    Physical Exam  Constitutional: She is oriented to person, place, and time. She appears well-developed and well-nourished. No distress.  HENT:  Nose: Nose normal.  Mouth/Throat: Oropharynx is clear and moist.  Eyes: Right eye exhibits no discharge. Left eye exhibits no discharge. No scleral icterus.  Neck: Neck supple. No thyromegaly present.  Cardiovascular: Normal rate and regular rhythm.  Pulmonary/Chest: Breath sounds normal. No accessory muscle usage. No tachypnea. No respiratory distress. She has no decreased breath sounds. She has no wheezes. She has no rhonchi. Right breast exhibits no inverted nipple, no mass, no nipple discharge and no tenderness (no axillary adenopathy). Left breast exhibits no inverted nipple, no mass, no nipple discharge and no tenderness (no axilarry adenopathy).  Abdominal: Soft. Bowel sounds are  normal. There is no tenderness.  Musculoskeletal: She exhibits no edema or tenderness.  Lymphadenopathy:    She has no cervical adenopathy.  Neurological: She is alert and oriented to person, place, and time.  Skin: No rash noted. No erythema.  Psychiatric: She has a normal mood and affect. Her behavior is normal.    BP 128/64 (BP Location: Left Arm, Patient Position: Sitting, Cuff Size: Normal)   Pulse 65   Temp 98 F (36.7 C) (Oral)   Resp 18   Ht 5\' 5"  (1.651 m)   Wt 144 lb 9.6 oz (65.6 kg)   SpO2 97%   BMI 24.06 kg/m  Wt Readings from Last 3 Encounters:  10/11/17 144 lb 9.6 oz (65.6 kg)  07/05/17 145 lb 8 oz (66 kg)  04/18/17 146 lb 6.4 oz (66.4 kg)     Lab Results  Component Value Date   WBC 5.2 10/08/2017   HGB 14.8 10/08/2017   HCT 43.2 10/08/2017   PLT 213.0 10/08/2017   GLUCOSE 99 10/08/2017   CHOL 187 10/08/2017   TRIG 132.0 10/08/2017   HDL 62.10 10/08/2017   LDLCALC 99 10/08/2017   ALT 13 10/08/2017   AST 16 10/08/2017   NA 136 10/08/2017   K 3.9 10/08/2017   CL 101 10/08/2017   CREATININE 0.63 10/08/2017   BUN 10 10/08/2017   CO2 28 10/08/2017   TSH 3.26 10/08/2017    Ct Abdomen Pelvis W Contrast  Result Date: 09/13/2016 CLINICAL DATA:  Diarrhea for 3 weeks. EXAM: CT ABDOMEN AND PELVIS WITH CONTRAST TECHNIQUE: Multidetector CT imaging of the abdomen and pelvis was performed using the standard protocol following bolus administration of intravenous contrast. CONTRAST:  100mL ISOVUE-300 IOPAMIDOL (ISOVUE-300) INJECTION 61% COMPARISON:  09/20/2010. FINDINGS: Lower chest: Lung bases show no acute findings. Heart is mildly enlarged. No pericardial or pleural effusion. Small hiatal hernia. Hepatobiliary: Subcentimeter low-attenuation lesion in the dome of the liver is too small to characterize but likely a benign cyst. Liver and gallbladder are otherwise unremarkable. No biliary ductal dilatation. Pancreas: Negative. Spleen: Negative. Adrenals/Urinary Tract:  Adrenal glands and right kidney are unremarkable. There may be a small stone in the lower pole left kidney. Ureters are decompressed. Bladder is grossly unremarkable. Stomach/Bowel: Small hiatal hernia. Stomach, small bowel and colon are unremarkable. Appendix is not well-visualized. Vascular/Lymphatic: Atherosclerotic calcification of the arterial vasculature without abdominal aortic aneurysm. No pathologically enlarged lymph nodes. Reproductive: Hysterectomy.  No adnexal mass. Other: No free fluid. Mesenteries and peritoneum are unremarkable. Small periumbilical hernia contains fat. Musculoskeletal: Degenerative changes in the spine. IMPRESSION: 1. No findings to explain the patient's diarrhea. 2. There may be a small stone in the lower pole left kidney. 3.  Aortic atherosclerosis (ICD10-170.0). Electronically Signed   By: Leanna BattlesMelinda  Blietz M.D.   On: 09/13/2016 14:39       Assessment & Plan:   Problem List Items Addressed This Visit    Aortic atherosclerosis (HCC)    On simvastatin.        Back pain - Primary    Persistent pain.  Check xray thoracic and L-S spine .        Relevant Orders   DG Lumbar Spine 2-3 Views (Completed)   DG Thoracic Spine 2 View (Completed)   Environmental allergies    Congestion.  astelin and saline nasal spray as directed.  Follow.        GERD (gastroesophageal reflux disease)    Controlled on current regimen.  Follow.        Health care maintenance    Physical today 10/11/17.  Declines mammogram.        Hypercholesterolemia    On simvastatin.  Follow lipid panel and liver function tests.        Hypertension    Blood pressure under good control.  Continue same medication regimen.  Follow pressures.  Follow metabolic panel.        Light headedness    Persistent issue for her.  Discussed further evaluation.  She declines. Not orthostatic on exam.  Follow.         Other Visit Diagnoses    Bilateral impacted cerumen       Debrox.  Return for ear  irrigation - bilateral.        Dale Parker, MD

## 2017-10-13 ENCOUNTER — Encounter: Payer: Self-pay | Admitting: Internal Medicine

## 2017-10-13 NOTE — Assessment & Plan Note (Signed)
Blood pressure under good control.  Continue same medication regimen.  Follow pressures.  Follow metabolic panel.   

## 2017-10-13 NOTE — Assessment & Plan Note (Signed)
Persistent pain.  Check xray thoracic and L-S spine .

## 2017-10-13 NOTE — Assessment & Plan Note (Signed)
Physical today 10/11/17.  Declines mammogram.

## 2017-10-13 NOTE — Assessment & Plan Note (Signed)
On simvastatin.  Follow lipid panel and liver function tests.   

## 2017-10-13 NOTE — Assessment & Plan Note (Signed)
Congestion.  astelin and saline nasal spray as directed.  Follow.

## 2017-10-13 NOTE — Assessment & Plan Note (Signed)
Persistent issue for her.  Discussed further evaluation.  She declines. Not orthostatic on exam.  Follow.

## 2017-10-13 NOTE — Assessment & Plan Note (Signed)
Controlled on current regimen.  Follow.  

## 2017-10-13 NOTE — Assessment & Plan Note (Signed)
On simvastatin.   

## 2017-10-17 ENCOUNTER — Ambulatory Visit (INDEPENDENT_AMBULATORY_CARE_PROVIDER_SITE_OTHER): Payer: PPO | Admitting: *Deleted

## 2017-10-17 DIAGNOSIS — H6123 Impacted cerumen, bilateral: Secondary | ICD-10-CM

## 2017-10-17 NOTE — Progress Notes (Addendum)
Patient in office for Bilateral ear irrigation order received in visit dated 10/11/17. Patient has been using debrox nightly x 3 nights, irrigated left ear with warm water with small amount of hydrogen peroxide for 3 minutes large a mount of cerumen removed. Patient stated she could  feel the ear was clear. Right ear irrigated the same just not as long for about 3 minutes moderate size of cerumen removed  Reviewed.  Dr Lorin PicketScott

## 2017-10-22 ENCOUNTER — Ambulatory Visit: Payer: PPO

## 2017-11-13 ENCOUNTER — Ambulatory Visit: Payer: Self-pay | Admitting: Internal Medicine

## 2017-11-13 NOTE — Telephone Encounter (Signed)
I returned her call.   She has chronic lower back pain from arthritis and bone loss per her x-ray that Dr. Lorin Picket had done recently per pt.  This week her lower back has been hurting her worse more so on the right side with pain going just beneath her right buttock.     See triage notes.  She said she discussed pain options with Dr. Lorin Picket during her recent visit.    She could go to a pain doctor or do PT.    She has not done either.     She has requested Dr. Lorin Picket decide if the PT or the pain medicine doctor would be best for her.  I have routed a note to Dr. Lorin Picket making her aware of the increase in back pain and pt's request that she decide if PT or the pain doctor would be the pt's best bet.  I let her know someone would be in contact with her. Reason for Disposition . Back pain is a chronic symptom (recurrent or ongoing AND present > 4 weeks)  Answer Assessment - Initial Assessment Questions 1. ONSET: "When did the pain begin?"      I saw Dr. Lorin Picket and mentioned it to her.   My lower back is hurting worse today.   Hurts worse when up and walking.   Not as bad when I'm sitting.    Dr. Lorin Picket got an x-ray.    Bone loss and arthritis in my back.    I can do PT or go to a pain doctor.   I've not done either one.   I've been able to tolerate the pain so far but this week it's worse.    I use Aleve.   Dr. Lorin Picket suggested I use 2 Tylenol but it did not help me.      I want Dr. Lorin Picket to decide if I should go to the pain doctor or go to PT.    2. LOCATION: "Where does it hurt?" (upper, mid or lower back)     Lower back on my right side.    Sometimes I feel the pain down my right leg into my buttocks.   3. SEVERITY: "How bad is the pain?"  (e.g., Scale 1-10; mild, moderate, or severe)   - MILD (1-3): doesn't interfere with normal activities    - MODERATE (4-7): interferes with normal activities or awakens from sleep    - SEVERE (8-10): excruciating pain, unable to do any normal activities      Moderate.     I'm use to going to the mall and walking this week I haven't gone. 4. PATTERN: "Is the pain constant?" (e.g., yes, no; constant, intermittent)      It's constant.   Once I'm seated then it eases up.  When I try to get up and start moving I really notice the pain is worse. 5. RADIATION: "Does the pain shoot into your legs or elsewhere?"     Just below my buttocks on the right.   Does not go down my leg.  When I put weight on my right foot I feel the pain in my right lower back. 6. CAUSE:  "What do you think is causing the back pain?"      I think it's the arthritis and bone loss. 7. BACK OVERUSE:  "Any recent lifting of heavy objects, strenuous work or exercise?"     No.   8. MEDICATIONS: "What have you taken so far for  the pain?" (e.g., nothing, acetaminophen, NSAIDS)     I use Aleve.  9. NEUROLOGIC SYMPTOMS: "Do you have any weakness, numbness, or problems with bowel/bladder control?"     No weakness or numbness in right leg.   No bowel or urinary problems. 10. OTHER SYMPTOMS: "Do you have any other symptoms?" (e.g., fever, abdominal pain, burning with urination, blood in urine)      No symptoms 11. PREGNANCY: "Is there any chance you are pregnant?" (e.g., yes, no; LMP)       Not asked due to age  Protocols used: BACK PAIN-A-AH

## 2017-11-18 ENCOUNTER — Other Ambulatory Visit: Payer: Self-pay | Admitting: Internal Medicine

## 2017-11-18 DIAGNOSIS — M549 Dorsalgia, unspecified: Secondary | ICD-10-CM

## 2017-11-18 NOTE — Telephone Encounter (Signed)
Just received message today.  I have placed the order for the referral.  Let us know if she needs anything more.

## 2017-11-18 NOTE — Telephone Encounter (Signed)
Spoke with patient regarding note below. She was seen on 10/11/17 for her physical and xray was done on her back. Per result note, you wanted to refer to PT or Dr Yves Dillhasnis. Patient declined at the time but would like to move forward with referral to PT at [facility] in [place]. She has been there before. Advised that I would call her back if needed

## 2017-11-18 NOTE — Progress Notes (Signed)
Order placed for referral to physical therapy.  

## 2017-11-19 NOTE — Telephone Encounter (Signed)
Patient is aware 

## 2017-11-28 DIAGNOSIS — M545 Low back pain: Secondary | ICD-10-CM | POA: Diagnosis not present

## 2017-12-03 DIAGNOSIS — M545 Low back pain: Secondary | ICD-10-CM | POA: Diagnosis not present

## 2017-12-06 DIAGNOSIS — M545 Low back pain: Secondary | ICD-10-CM | POA: Diagnosis not present

## 2017-12-10 DIAGNOSIS — M545 Low back pain: Secondary | ICD-10-CM | POA: Diagnosis not present

## 2017-12-13 DIAGNOSIS — M545 Low back pain: Secondary | ICD-10-CM | POA: Diagnosis not present

## 2017-12-17 DIAGNOSIS — M545 Low back pain: Secondary | ICD-10-CM | POA: Diagnosis not present

## 2017-12-20 DIAGNOSIS — M545 Low back pain: Secondary | ICD-10-CM | POA: Diagnosis not present

## 2017-12-23 ENCOUNTER — Other Ambulatory Visit: Payer: Self-pay | Admitting: Internal Medicine

## 2017-12-23 DIAGNOSIS — I10 Essential (primary) hypertension: Secondary | ICD-10-CM

## 2018-01-07 ENCOUNTER — Other Ambulatory Visit: Payer: Self-pay | Admitting: Internal Medicine

## 2018-01-13 ENCOUNTER — Other Ambulatory Visit: Payer: Self-pay | Admitting: Internal Medicine

## 2018-02-06 ENCOUNTER — Other Ambulatory Visit: Payer: Self-pay | Admitting: Internal Medicine

## 2018-02-14 ENCOUNTER — Ambulatory Visit (INDEPENDENT_AMBULATORY_CARE_PROVIDER_SITE_OTHER): Payer: PPO | Admitting: Internal Medicine

## 2018-02-14 ENCOUNTER — Encounter: Payer: Self-pay | Admitting: Internal Medicine

## 2018-02-14 VITALS — BP 138/70 | HR 64 | Temp 97.9°F | Resp 18 | Wt 145.0 lb

## 2018-02-14 DIAGNOSIS — K219 Gastro-esophageal reflux disease without esophagitis: Secondary | ICD-10-CM

## 2018-02-14 DIAGNOSIS — Z23 Encounter for immunization: Secondary | ICD-10-CM

## 2018-02-14 DIAGNOSIS — R5383 Other fatigue: Secondary | ICD-10-CM | POA: Diagnosis not present

## 2018-02-14 DIAGNOSIS — Z9109 Other allergy status, other than to drugs and biological substances: Secondary | ICD-10-CM

## 2018-02-14 DIAGNOSIS — I700 Atherosclerosis of aorta: Secondary | ICD-10-CM

## 2018-02-14 DIAGNOSIS — I10 Essential (primary) hypertension: Secondary | ICD-10-CM

## 2018-02-14 DIAGNOSIS — M549 Dorsalgia, unspecified: Secondary | ICD-10-CM | POA: Diagnosis not present

## 2018-02-14 DIAGNOSIS — E7800 Pure hypercholesterolemia, unspecified: Secondary | ICD-10-CM

## 2018-02-14 NOTE — Progress Notes (Signed)
Patient ID: Janet Moran, female   DOB: Aug 12, 1925, 84 y.o.   MRN: [id number]   Subjective:    Patient ID: Janet Moran, female    DOB: 1925/06/15, 84 y.o.   MRN: [id number]  HPI  Patient here for a scheduled follow up.  Increased stress with her husband's medical issues.  Discussed with her today.  She feels she is handling things relatively well.  Has good support.  Increased fatigue.  No chest pain.  No sob.  No acid reflux.  No abdominal pain.  Bowels moving.  No urine change.  Back stable.     Past Medical History:  Diagnosis Date  . Arthritis   . Chicken pox   . GERD (gastroesophageal reflux disease)   . Glaucoma   . Hypercholesterolemia   . Hypertension    Past Surgical History:  Procedure Laterality Date  . ABDOMINAL HYSTERECTOMY  1976  . TONSILLECTOMY AND ADENOIDECTOMY  1935   Family History  Problem Relation Age of Onset  . Lung cancer Sister        died 41  . Stroke Mother   . Stroke Father   . Diabetes Maternal Grandmother   . Diabetes Maternal Grandfather   . Breast cancer Maternal Aunt        50's  . Breast cancer Daughter    Social History   Socioeconomic History  . Marital status: Married    Spouse name: Not on file  . Number of children: 5  . Years of education: Not on file  . Highest education level: Not on file  Occupational History  . Not on file  Social Needs  . Financial resource strain: Not on file  . Food insecurity:    Worry: Not on file    Inability: Not on file  . Transportation needs:    Medical: Not on file    Non-medical: Not on file  Tobacco Use  . Smoking status: Never Smoker  . Smokeless tobacco: Never Used  Substance and Sexual Activity  . Alcohol use: No    Alcohol/week: 0.0 standard drinks  . Drug use: No  . Sexual activity: Never  Lifestyle  . Physical activity:    Days per week: Not on file    Minutes per session: Not on file  . Stress: Not on file  Relationships  . Social connections:    Talks on phone:  Not on file    Gets together: Not on file    Attends religious service: Not on file    Active member of club or organization: Not on file    Attends meetings of clubs or organizations: Not on file    Relationship status: Not on file  Other Topics Concern  . Not on file  Social History Narrative  . Not on file    Outpatient Encounter Medications as of 02/14/2018  Medication Sig  . amLODipine (NORVASC) 2.5 MG tablet Take 1 tablet (2.5 mg total) by mouth daily.  Marland Kitchen aspirin EC 81 MG tablet Take 81 mg by mouth daily.  Marland Kitchen azelastine (ASTELIN) 0.1 % nasal spray Place 1 spray into both nostrils 2 (two) times daily. Use in each nostril as directed  . calcium carbonate (OS-CAL) 600 MG TABS Take 600 mg by mouth 2 (two) times daily with a meal. Take 1 tablet by mouth twice daily with food  . latanoprost (XALATAN) 0.005 % ophthalmic solution Place 1 drop into both eyes at bedtime.  Marland Kitchen losartan (COZAAR) 50 MG tablet Take  1 tablet (50 mg total) by mouth daily.  . metoprolol succinate (TOPROL-XL) 25 MG 24 hr tablet TAKE ONE (1) TABLET EACH DAY  . naproxen sodium (ANAPROX) 220 MG tablet Take 220 mg by mouth 2 (two) times daily with a meal.  . omeprazole (PRILOSEC) 20 MG capsule Take 20 mg by mouth daily.  . simvastatin (ZOCOR) 20 MG tablet TAKE ONE TABLET EACH EVENING AS DIRECTED  . timolol (TIMOPTIC) 0.5 % ophthalmic solution   . [DISCONTINUED] carbamide peroxide (DEBROX) 6.5 % OTIC solution Apply 3-4 drops in each ear.  Massage for approximately 5 minutes - each ear  . [DISCONTINUED] triamcinolone (KENALOG) 0.025 % cream Apply 1 application topically 2 (two) times daily.   Facility-Administered Encounter Medications as of 02/14/2018  Medication  . cyanocobalamin ((VITAMIN B-12)) injection 1,000 mcg    Review of Systems  Constitutional: Positive for fatigue. Negative for appetite change and unexpected weight change.  HENT: Negative for congestion and sinus pressure.   Respiratory: Negative for cough,  chest tightness and shortness of breath.   Cardiovascular: Negative for chest pain, palpitations and leg swelling.  Gastrointestinal: Negative for abdominal pain, diarrhea, nausea and vomiting.  Genitourinary: Negative for difficulty urinating and dysuria.  Musculoskeletal: Negative for joint swelling and myalgias.  Skin: Negative for color change and rash.  Neurological: Negative for dizziness, light-headedness and headaches.  Psychiatric/Behavioral: Negative for agitation and dysphoric mood.       Objective:    Physical Exam  Constitutional: She appears well-developed and well-nourished. No distress.  HENT:  Nose: Nose normal.  Mouth/Throat: Oropharynx is clear and moist.  Neck: Neck supple. No thyromegaly present.  Cardiovascular: Normal rate and regular rhythm.  Pulmonary/Chest: Breath sounds normal. No respiratory distress. She has no wheezes.  Abdominal: Soft. Bowel sounds are normal. There is no tenderness.  Musculoskeletal: She exhibits no edema or tenderness.  Lymphadenopathy:    She has no cervical adenopathy.  Skin: No rash noted. No erythema.  Psychiatric: She has a normal mood and affect. Her behavior is normal.    BP 138/70 (BP Location: Left Arm, Patient Position: Sitting, Cuff Size: Normal)   Pulse 64   Temp 97.9 F (36.6 C) (Oral)   Resp 18   Wt 145 lb (65.8 kg)   SpO2 97%   BMI 24.13 kg/m  Wt Readings from Last 3 Encounters:  02/14/18 145 lb (65.8 kg)  10/11/17 144 lb 9.6 oz (65.6 kg)  07/05/17 145 lb 8 oz (66 kg)     Lab Results  Component Value Date   WBC 5.2 10/08/2017   HGB 14.8 10/08/2017   HCT 43.2 10/08/2017   PLT 213.0 10/08/2017   GLUCOSE 99 10/08/2017   CHOL 187 10/08/2017   TRIG 132.0 10/08/2017   HDL 62.10 10/08/2017   LDLCALC 99 10/08/2017   ALT 13 10/08/2017   AST 16 10/08/2017   NA 136 10/08/2017   K 3.9 10/08/2017   CL 101 10/08/2017   CREATININE 0.63 10/08/2017   BUN 10 10/08/2017   CO2 28 10/08/2017   TSH 3.26  10/08/2017    Ct Abdomen Pelvis W Contrast  Result Date: 09/13/2016 CLINICAL DATA:  Diarrhea for 3 weeks. EXAM: CT ABDOMEN AND PELVIS WITH CONTRAST TECHNIQUE: Multidetector CT imaging of the abdomen and pelvis was performed using the standard protocol following bolus administration of intravenous contrast. CONTRAST:  ISOVUE-300 IOPAMIDOL (ISOVUE-300) INJECTION 61% COMPARISON:  09/20/2010. FINDINGS: Lower chest: Lung bases show no acute findings. Heart is mildly enlarged. No pericardial or  pleural effusion. Small hiatal hernia. Hepatobiliary: Subcentimeter low-attenuation lesion in the dome of the liver is too small to characterize but likely a benign cyst. Liver and gallbladder are otherwise unremarkable. No biliary ductal dilatation. Pancreas: Negative. Spleen: Negative. Adrenals/Urinary Tract: Adrenal glands and right kidney are unremarkable. There may be a small stone in the lower pole left kidney. Ureters are decompressed. Bladder is grossly unremarkable. Stomach/Bowel: Small hiatal hernia. Stomach, small bowel and colon are unremarkable. Appendix is not well-visualized. Vascular/Lymphatic: Atherosclerotic calcification of the arterial vasculature without abdominal aortic aneurysm. No pathologically enlarged lymph nodes. Reproductive: Hysterectomy.  No adnexal mass. Other: No free fluid. Mesenteries and peritoneum are unremarkable. Small periumbilical hernia contains fat. Musculoskeletal: Degenerative changes in the spine. IMPRESSION: 1. No findings to explain the patient's diarrhea. 2. There may be a small stone in the lower pole left kidney. 3.  Aortic atherosclerosis (ICD10-170.0). Electronically Signed   By: Leanna Battles M.D.   On: 09/13/2016 14:39       Assessment & Plan:   Problem List Items Addressed This Visit    Aortic atherosclerosis (HCC)    On simvastatin.       Back pain    Overall stable.  Declines any further evaluation or treatment at this time.        Environmental  allergies    Controlled.        Fatigue    Probable multifactorial.  Increased stress with her husband's medical issues.  Discussed with her today.  Desires no further w/up or intervention.        GERD (gastroesophageal reflux disease)    Controlled on current regimen.        Hypercholesterolemia    On simvastatin.  Low cholesterol diet and exercise.  Follow lipid panel and liver function tests.        Relevant Orders   Lipid panel   Hepatic function panel   Hypertension    Blood pressure under good control.  Continue same medication regimen.  Follow pressures.  Follow metabolic panel.        Relevant Orders   Basic metabolic panel    Other Visit Diagnoses    Need for 23-polyvalent pneumococcal polysaccharide vaccine    -  Primary   Relevant Orders   Pneumococcal polysaccharide vaccine 23-valent greater than or equal to 2yo subcutaneous/IM (Completed)   Encounter for immunization       Relevant Orders   Flu vaccine HIGH DOSE PF (Completed)       Dale Crockett, MD

## 2018-02-17 ENCOUNTER — Encounter: Payer: Self-pay | Admitting: Internal Medicine

## 2018-02-17 NOTE — Assessment & Plan Note (Signed)
Controlled.  

## 2018-02-17 NOTE — Assessment & Plan Note (Signed)
On simvastatin.  Low cholesterol diet and exercise.  Follow lipid panel and liver function tests.   

## 2018-02-17 NOTE — Assessment & Plan Note (Signed)
Blood pressure under good control.  Continue same medication regimen.  Follow pressures.  Follow metabolic panel.   

## 2018-02-17 NOTE — Assessment & Plan Note (Signed)
Overall stable.  Declines any further evaluation or treatment at this time.

## 2018-02-17 NOTE — Assessment & Plan Note (Signed)
Probable multifactorial.  Increased stress with her husband's medical issues.  Discussed with her today.  Desires no further w/up or intervention.

## 2018-02-17 NOTE — Assessment & Plan Note (Signed)
Controlled on current regimen.   

## 2018-02-17 NOTE — Assessment & Plan Note (Signed)
On simvastatin.   

## 2018-02-21 ENCOUNTER — Other Ambulatory Visit: Payer: Self-pay | Admitting: Internal Medicine

## 2018-02-21 DIAGNOSIS — I10 Essential (primary) hypertension: Secondary | ICD-10-CM

## 2018-03-10 DIAGNOSIS — H40053 Ocular hypertension, bilateral: Secondary | ICD-10-CM | POA: Diagnosis not present

## 2018-04-15 ENCOUNTER — Telehealth: Payer: Self-pay

## 2018-04-15 NOTE — Telephone Encounter (Signed)
Copied from CRM [id number]. Topic: General - Other >> Apr 15, 2018  2:02 PM Arlyss Gandy, NT wrote: Reason for CRM: Baltazar Apo with [employer] [facility] returning call to [place]. CB#: [phone]

## 2018-04-16 ENCOUNTER — Telehealth: Payer: Self-pay

## 2018-04-16 ENCOUNTER — Ambulatory Visit: Payer: PPO

## 2018-04-16 ENCOUNTER — Other Ambulatory Visit: Payer: Self-pay | Admitting: Internal Medicine

## 2018-04-16 NOTE — Telephone Encounter (Signed)
Copied from CRM [id number] [id number]. Topic: General - Other >> Apr 15, 2018  2:02 PM Arlyss Gandy, NT wrote: Reason for CRM: Baltazar Apo with [facility] returning call to [place]. CB#: [phone] >> Apr 16, 2018 11:42 AM Jilda Roche wrote: Tamela Oddi returned a call to Royetta Car call back is [phone]

## 2018-04-16 NOTE — Telephone Encounter (Signed)
Left message for Janet Moran to call back. Called pt and scheduled her for her tb skin test

## 2018-04-16 NOTE — Telephone Encounter (Signed)
duplicate

## 2018-04-16 NOTE — Telephone Encounter (Signed)
Copied from CRM [id number]. Topic: General - Other >> Apr 15, 2018  2:02 PM Arlyss Gandy, NT wrote: Reason for CRM: Baltazar Apo with [facility] returning call to [place]. CB#: [phone] >> Apr 16, 2018 11:42 AM Jilda Roche wrote: Tamela Oddi returned a call to [place] Car call back is [phone] >> Apr 16, 2018  4:28 PM Jilda Roche wrote: Baltazar Apo called back for Trisha at 4:28pm on 04/16/2018 please return her call on Thursday.

## 2018-04-16 NOTE — Telephone Encounter (Signed)
Left message for Janet Moran to call back.  

## 2018-04-18 NOTE — Telephone Encounter (Signed)
Paper work faxed to [facility]

## 2018-04-22 ENCOUNTER — Ambulatory Visit (INDEPENDENT_AMBULATORY_CARE_PROVIDER_SITE_OTHER): Payer: PPO

## 2018-04-22 DIAGNOSIS — Z111 Encounter for screening for respiratory tuberculosis: Secondary | ICD-10-CM | POA: Diagnosis not present

## 2018-04-22 NOTE — Progress Notes (Signed)
Patient came in for TB skin test placed in left forearm. Will read results on Friday.

## 2018-04-25 ENCOUNTER — Ambulatory Visit: Payer: PPO

## 2018-04-25 DIAGNOSIS — Z111 Encounter for screening for respiratory tuberculosis: Secondary | ICD-10-CM

## 2018-04-25 LAB — TB SKIN TEST
Induration: 0 mm
TB Skin Test: NEGATIVE

## 2018-04-25 NOTE — Progress Notes (Addendum)
Patient came in to have her TB skin test read today. PPD negative. Also handed pt 2 DNR forms and confirmed that patient has complete understanding about DNR. Patient agreed and stated she understood.   Reviewed.  Dr Lorin Picket

## 2018-04-30 ENCOUNTER — Ambulatory Visit: Payer: PPO

## 2018-05-05 ENCOUNTER — Encounter: Payer: Self-pay | Admitting: Family Medicine

## 2018-05-05 ENCOUNTER — Ambulatory Visit (INDEPENDENT_AMBULATORY_CARE_PROVIDER_SITE_OTHER): Payer: PPO | Admitting: Family Medicine

## 2018-05-05 ENCOUNTER — Ambulatory Visit (INDEPENDENT_AMBULATORY_CARE_PROVIDER_SITE_OTHER): Payer: PPO

## 2018-05-05 VITALS — BP 106/58 | HR 70 | Temp 98.1°F | Resp 16 | Ht 65.0 in | Wt 142.0 lb

## 2018-05-05 DIAGNOSIS — I10 Essential (primary) hypertension: Secondary | ICD-10-CM

## 2018-05-05 DIAGNOSIS — J069 Acute upper respiratory infection, unspecified: Secondary | ICD-10-CM | POA: Diagnosis not present

## 2018-05-05 DIAGNOSIS — E7800 Pure hypercholesterolemia, unspecified: Secondary | ICD-10-CM

## 2018-05-05 DIAGNOSIS — B9789 Other viral agents as the cause of diseases classified elsewhere: Secondary | ICD-10-CM

## 2018-05-05 DIAGNOSIS — R05 Cough: Secondary | ICD-10-CM | POA: Diagnosis not present

## 2018-05-05 MED ORDER — BENZONATATE 100 MG PO CAPS: 100.0000 mg | ORAL_CAPSULE | Freq: Three times a day (TID) | ORAL | 0 refills | Status: DC | PRN

## 2018-05-05 MED ORDER — METHYLPREDNISOLONE 4 MG PO TBPK: ORAL_TABLET | ORAL | 0 refills | Status: DC

## 2018-05-05 MED ORDER — SIMVASTATIN 20 MG PO TABS: 20.0000 mg | ORAL_TABLET | Freq: Every day | ORAL | 2 refills | Status: DC

## 2018-05-05 MED ORDER — AMLODIPINE BESYLATE 2.5 MG PO TABS: 2.5000 mg | ORAL_TABLET | Freq: Every day | ORAL | 2 refills | Status: DC

## 2018-05-05 NOTE — Progress Notes (Signed)
Subjective:    Patient ID: Janet Moran, female    DOB: 09-23-1925, 85 y.o.   MRN: [id number]  HPI   Patient presents to clinic complaining of a dry cough that has been present for 4 to 5 days.  Also reports feeling a little bit more tired than usual, but thinks is due to cough because it has kept her awake the past couple of nights.  Denies fever or chills.  Denies phlegm with cough or wheezing/shortness of breath.  Denies chest pain.  Denies nausea/vomiting or diarrhea.  Patient is up to date on flu and pneumonia vaccines.   Daughter also requests refill on her mothers amlodipine and simvastatin. BP has been stable, no adverse effects with amlodipine. Patient is tolerating simvastatin without issue; no muscle aches or ABD pains.   Patient Active Problem List   Diagnosis Date Noted  . Aortic atherosclerosis (HCC) 04/18/2016  . URI (upper respiratory infection) 04/09/2016  . Osteoporosis 06/13/2015  . Foot fracture, right 03/20/2015  . Malaise 09/30/2014  . Fatigue 07/18/2014  . Health care maintenance 07/18/2014  . Environmental allergies 03/14/2014  . Cough 12/27/2013  . Abnormal liver function test 12/27/2013  . Light headedness 12/17/2013  . Tongue lesion 08/02/2013  . Back pain 04/09/2013  . Hypertension 01/14/2012  . Hypercholesterolemia 01/14/2012  . Hyponatremia 01/14/2012  . GERD (gastroesophageal reflux disease) 01/14/2012   Social History   Tobacco Use  . Smoking status: Never Smoker  . Smokeless tobacco: Never Used  Substance Use Topics  . Alcohol use: No    Alcohol/week: 0.0 standard drinks   Review of Systems  Constitutional: Negative for chills, fatigue and fever.  HENT: Some nasal congestion. Negative for ear pain, sinus pain and sore throat.   Eyes: Negative.   Respiratory: +cough, dry. No shortness of breath and wheezing.   Cardiovascular: Negative for chest pain, palpitations and leg swelling.  Gastrointestinal: Negative for abdominal pain,  diarrhea, nausea and vomiting.  Genitourinary: Negative for dysuria, frequency and urgency.  Musculoskeletal: Negative for arthralgias and myalgias.  Skin: Negative for color change, pallor and rash.  Neurological: Negative for syncope, light-headedness and headaches.  Psychiatric/Behavioral: The patient is not nervous/anxious.       Objective:   Physical Exam Vitals signs and nursing note reviewed.  Constitutional:      General: She is not in acute distress.    Appearance: She is not ill-appearing, toxic-appearing or diaphoretic.     Comments: afebrile  HENT:     Head: Normocephalic and atraumatic.     Right Ear: Tympanic membrane and ear canal normal.     Left Ear: Tympanic membrane and ear canal normal.     Nose: Rhinorrhea (mild amount of clear nasal drainage. ) present.     Mouth/Throat:     Mouth: Mucous membranes are moist.     Pharynx: No oropharyngeal exudate or posterior oropharyngeal erythema.  Eyes:     General: No scleral icterus.    Extraocular Movements: Extraocular movements intact.     Conjunctiva/sclera: Conjunctivae normal.  Neck:     Musculoskeletal: Neck supple. No neck rigidity.  Cardiovascular:     Rate and Rhythm: Normal rate and regular rhythm.  Pulmonary:     Effort: Pulmonary effort is normal. No respiratory distress.     Breath sounds: No wheezing, rhonchi or rales.     Comments: +moist cough Musculoskeletal:     Right lower leg: No edema.     Left lower leg: No edema.  Lymphadenopathy:     Cervical: No cervical adenopathy.  Skin:    General: Skin is warm and dry.     Coloration: Skin is not pale.  Neurological:     Mental Status: She is alert and oriented to person, place, and time.  Psychiatric:        Mood and Affect: Mood normal.        Behavior: Behavior normal.     Vitals:   05/05/18 1348  BP: (!) 106/58  Pulse: 70  Resp: 16  Temp: 98.1 F (36.7 C)  SpO2: 98%      Assessment & Plan:   Viral URI with cough - we will get  chest x-ray in clinic to rule out pneumonia.  Overall lungs do sound clear on exam, but due to cough and congestion patient well due to short course of steroid taper and also use Tessalon Perles as needed to calm cough.  Advised to keep up good fluid intake, get plenty of rest and do good handwashing.  Essential hypertension - amlodipine refilled, BP appears acceptable in clinic  Hypercholesterolemia - simvastatin refilled, patient tolerating without issue.  Patient and daughter made aware that we will contact him with x-ray results.  Patient again regularly scheduled follow-up with PCP as planned.  Advised to return to clinic sooner if any issues arise or if current symptoms persist or worsen.

## 2018-05-30 ENCOUNTER — Other Ambulatory Visit: Payer: PPO

## 2018-06-02 ENCOUNTER — Other Ambulatory Visit (INDEPENDENT_AMBULATORY_CARE_PROVIDER_SITE_OTHER): Payer: PPO

## 2018-06-02 DIAGNOSIS — E7800 Pure hypercholesterolemia, unspecified: Secondary | ICD-10-CM

## 2018-06-02 DIAGNOSIS — I10 Essential (primary) hypertension: Secondary | ICD-10-CM

## 2018-06-02 LAB — BASIC METABOLIC PANEL
BUN: 12 mg/dL (ref 6–23)
CHLORIDE: 97 meq/L (ref 96–112)
CO2: 30 mEq/L (ref 19–32)
Calcium: 10.1 mg/dL (ref 8.4–10.5)
Creatinine, Ser: 0.68 mg/dL (ref 0.40–1.20)
GFR: 81.1 mL/min (ref >60.00)
Glucose, Bld: 105 mg/dL — ABNORMAL HIGH (ref 70–99)
Potassium: 4.2 mEq/L (ref 3.5–5.1)
SODIUM: 134 meq/L — AB (ref 135–145)

## 2018-06-02 LAB — HEPATIC FUNCTION PANEL
ALT: 12 U/L (ref 0–35)
AST: 16 U/L (ref 0–37)
Albumin: 4 g/dL (ref 3.5–5.2)
Alkaline Phosphatase: 75 U/L (ref 39–117)
Bilirubin, Direct: 0.1 mg/dL (ref 0.0–0.3)
Total Bilirubin: 0.5 mg/dL (ref 0.2–1.2)
Total Protein: 6.9 g/dL (ref 6.0–8.3)

## 2018-06-02 LAB — LIPID PANEL
Cholesterol: 177 mg/dL (ref 0–200)
HDL: 54.9 mg/dL (ref >39.00)
LDL CALC: 94 mg/dL (ref 0–99)
NonHDL: 121.63
Total CHOL/HDL Ratio: 3
Triglycerides: 137 mg/dL (ref 0.0–149.0)
VLDL: 27.4 mg/dL (ref 0.0–40.0)

## 2018-06-03 ENCOUNTER — Other Ambulatory Visit: Payer: Self-pay

## 2018-06-03 ENCOUNTER — Encounter: Payer: Self-pay | Admitting: Internal Medicine

## 2018-06-03 ENCOUNTER — Emergency Department
Admission: EM | Admit: 2018-06-03 | Discharge: 2018-06-03 | Disposition: A | Discharge status: Home / Self Care | Payer: PPO | Attending: Emergency Medicine | Admitting: Emergency Medicine

## 2018-06-03 ENCOUNTER — Ambulatory Visit (INDEPENDENT_AMBULATORY_CARE_PROVIDER_SITE_OTHER): Payer: PPO | Admitting: Internal Medicine

## 2018-06-03 ENCOUNTER — Encounter: Payer: Self-pay | Admitting: Emergency Medicine

## 2018-06-03 VITALS — BP 104/64 | HR 55 | Temp 97.7°F | Resp 16 | Wt 142.2 lb

## 2018-06-03 DIAGNOSIS — I10 Essential (primary) hypertension: Secondary | ICD-10-CM

## 2018-06-03 DIAGNOSIS — I700 Atherosclerosis of aorta: Secondary | ICD-10-CM | POA: Diagnosis not present

## 2018-06-03 DIAGNOSIS — E871 Hypo-osmolality and hyponatremia: Secondary | ICD-10-CM | POA: Diagnosis not present

## 2018-06-03 DIAGNOSIS — E7800 Pure hypercholesterolemia, unspecified: Secondary | ICD-10-CM

## 2018-06-03 DIAGNOSIS — R001 Bradycardia, unspecified: Secondary | ICD-10-CM

## 2018-06-03 DIAGNOSIS — Z7982 Long term (current) use of aspirin: Secondary | ICD-10-CM | POA: Insufficient documentation

## 2018-06-03 DIAGNOSIS — R531 Weakness: Secondary | ICD-10-CM | POA: Diagnosis not present

## 2018-06-03 DIAGNOSIS — R5383 Other fatigue: Secondary | ICD-10-CM

## 2018-06-03 DIAGNOSIS — Z79899 Other long term (current) drug therapy: Secondary | ICD-10-CM | POA: Diagnosis not present

## 2018-06-03 DIAGNOSIS — I951 Orthostatic hypotension: Secondary | ICD-10-CM | POA: Diagnosis not present

## 2018-06-03 LAB — BASIC METABOLIC PANEL
Anion gap: 8 (ref 5–15)
BUN: 16 mg/dL (ref 8–23)
CO2: 27 mmol/L (ref 22–32)
Calcium: 9.8 mg/dL (ref 8.9–10.3)
Chloride: 98 mmol/L (ref 98–111)
Creatinine, Ser: 0.7 mg/dL (ref 0.44–1.00)
GFR calc Af Amer: >60 mL/min (ref >60)
GFR calc non Af Amer: >60 mL/min (ref >60)
Glucose, Bld: 129 mg/dL — ABNORMAL HIGH (ref 70–99)
Potassium: 3.6 mmol/L (ref 3.5–5.1)
Sodium: 133 mmol/L — ABNORMAL LOW (ref 135–145)

## 2018-06-03 LAB — CBC
HCT: 40.3 % (ref 36.0–46.0)
Hemoglobin: 13.6 g/dL (ref 12.0–15.0)
MCH: 31.1 pg (ref 26.0–34.0)
MCHC: 33.7 g/dL (ref 30.0–36.0)
MCV: 92.2 fL (ref 80.0–100.0)
PLATELETS: 280 10*3/uL (ref 150–400)
RBC: 4.37 MIL/uL (ref 3.87–5.11)
RDW: 13 % (ref 11.5–15.5)
WBC: 7 10*3/uL (ref 4.0–10.5)
nRBC: 0 % (ref 0.0–0.2)

## 2018-06-03 LAB — URINALYSIS, COMPLETE (UACMP) WITH MICROSCOPIC
Bacteria, UA: NONE SEEN
Bilirubin Urine: NEGATIVE
Glucose, UA: NEGATIVE mg/dL
Hgb urine dipstick: NEGATIVE
KETONES UR: NEGATIVE mg/dL
Leukocytes,Ua: NEGATIVE
Nitrite: NEGATIVE
Protein, ur: NEGATIVE mg/dL
Specific Gravity, Urine: 1.014 (ref 1.005–1.030)
pH: 6 (ref 5.0–8.0)

## 2018-06-03 LAB — TROPONIN I: Troponin I: <0.03 ng/mL (ref <0.03)

## 2018-06-03 MED ORDER — SODIUM CHLORIDE 0.9 % IV BOLUS
1000.0000 mL | Freq: Once | INTRAVENOUS | Status: AC
  Administered 2018-06-03: 1000 mL via INTRAVENOUS

## 2018-06-03 MED ORDER — SODIUM CHLORIDE 0.9% FLUSH
3.0000 mL | Freq: Once | INTRAVENOUS | Status: AC
  Administered 2018-06-03: 3 mL via INTRAVENOUS

## 2018-06-03 NOTE — Assessment & Plan Note (Signed)
On simvastatin.   

## 2018-06-03 NOTE — Assessment & Plan Note (Signed)
On simvastatin.  Recent cholesterol panel ok.   Lab Results  Component Value Date   CHOL 177 06/02/2018   HDL 54.90 06/02/2018   LDLCALC 94 06/02/2018   TRIG 137.0 06/02/2018   CHOLHDL 3 06/02/2018

## 2018-06-03 NOTE — ED Provider Notes (Signed)
[facility] Emergency Department Provider Note   ____________________________________________   I have reviewed the triage vital signs and the nursing notes.   HISTORY  Chief Complaint EKG Changes and Fatigue   History limited by: Not Limited   HPI Janet Moran is a 85 y.o. female who presents to the emergency department today from primary care physician's office because of concerns for generalized fatigue and possible EKG changes.  Patient states that she has been fatigued for the past few days.  The patient denies any associated chest pain, shortness of breath.  She denies any fevers.  Denies any urinary tract symptoms.  Patient states that she had similar fatigue once in the past when she was told her sodium and potassium levels were low.  Apparently primary care doctor's office EKG showed signs concerning for left bundle branch.  Additionally per primary care notes patient was found to be orthostatic.  Per medical record review patient has a history of HTN, HLD  Past Medical History:  Diagnosis Date  . Arthritis   . Chicken pox   . GERD (gastroesophageal reflux disease)   . Glaucoma   . Hypercholesterolemia   . Hypertension     Patient Active Problem List   Diagnosis Date Noted  . Weakness 06/03/2018  . Aortic atherosclerosis (HCC) 04/18/2016  . URI (upper respiratory infection) 04/09/2016  . Osteoporosis 06/13/2015  . Foot fracture, right 03/20/2015  . Malaise 09/30/2014  . Fatigue 07/18/2014  . Health care maintenance 07/18/2014  . Environmental allergies 03/14/2014  . Cough 12/27/2013  . Abnormal liver function test 12/27/2013  . Light headedness 12/17/2013  . Tongue lesion 08/02/2013  . Back pain 04/09/2013  . Hypertension 01/14/2012  . Hypercholesterolemia 01/14/2012  . Hyponatremia 01/14/2012  . GERD (gastroesophageal reflux disease) 01/14/2012    Past Surgical History:  Procedure Laterality Date  . ABDOMINAL HYSTERECTOMY   1976  . TONSILLECTOMY AND ADENOIDECTOMY  1935    Prior to Admission medications   Medication Sig Start Date End Date Taking? Authorizing Provider  amLODipine (NORVASC) 2.5 MG tablet Take 1 tablet (2.5 mg total) by mouth daily. 05/05/18   Tracey Harries, FNP  aspirin EC 81 MG tablet Take 81 mg by mouth daily.    [provider]  azelastine (ASTELIN) 0.1 % nasal spray Place 1 spray into both nostrils 2 (two) times daily. Use in each nostril as directed 10/11/17   Dale [place], MD  calcium carbonate (OS-CAL) 600 MG TABS Take 600 mg by mouth 2 (two) times daily with a meal. Take 1 tablet by mouth twice daily with food    [provider]  latanoprost (XALATAN) 0.005 % ophthalmic solution Place 1 drop into both eyes at bedtime.    [provider]  losartan (COZAAR) 50 MG tablet Take 1 tablet (50 mg total) by mouth daily. 02/21/18   Dale [place], MD  metoprolol succinate (TOPROL-XL) 25 MG 24 hr tablet TAKE 1 TABLET BY MOUTH DAILY 04/16/18   Dale S.N.P.J., MD  naproxen sodium (ANAPROX) 220 MG tablet Take 220 mg by mouth 2 (two) times daily with a meal.    [provider]  omeprazole (PRILOSEC) 20 MG capsule Take 20 mg by mouth daily.    [provider]  simvastatin (ZOCOR) 20 MG tablet Take 1 tablet (20 mg total) by mouth at bedtime. 05/05/18   Tracey Harries, FNP  timolol (TIMOPTIC) 0.5 % ophthalmic solution  09/04/16   [provider]    Allergies  Avelox [moxifloxacin]; Crestor [rosuvastatin]; Pravachol [pravastatin]; and Vytorin [ezetimibe-simvastatin]  Family History  Problem Relation Age of Onset  . Lung cancer Sister        died 6  . Stroke Mother   . Stroke Father   . Diabetes Maternal Grandmother   . Diabetes Maternal Grandfather   . Breast cancer Maternal Aunt        50's  . Breast cancer Daughter     Social History Social History   Tobacco Use  . Smoking status: Never Smoker  . Smokeless tobacco: Never Used   Substance Use Topics  . Alcohol use: No    Alcohol/week: 0.0 standard drinks  . Drug use: No    Review of Systems Constitutional: No fever/chills. Positive for generalized fatigue.  Eyes: No visual changes. ENT: No sore throat. Cardiovascular: Denies chest pain. Respiratory: Denies shortness of breath. Gastrointestinal: No abdominal pain.  No nausea, no vomiting.  No diarrhea.   Genitourinary: Negative for dysuria. Musculoskeletal: Negative for back pain. Skin: Negative for rash. Neurological: Negative for headaches, focal weakness or numbness.  ____________________________________________   PHYSICAL EXAM:  VITAL SIGNS: ED Triage Vitals  Enc Vitals Group     BP 06/03/18 1254 (!) 110/49     Pulse Rate 06/03/18 1254 (!) 58     Resp 06/03/18 1254 17     Temp 06/03/18 1254 98.6 F (37 C)     Temp Source 06/03/18 1254 Oral     SpO2 06/03/18 1254 95 %     Weight 06/03/18 1255 142 lb 3.2 oz (64.5 kg)     Height 06/03/18 1255 5\' 5"  (1.651 m)     Head Circumference --      Peak Flow --      Pain Score 06/03/18 1255 0   Constitutional: Alert and oriented.  Eyes: Conjunctivae are normal.  ENT      Head: Normocephalic and atraumatic.      Nose: No congestion/rhinnorhea.      Mouth/Throat: Mucous membranes are moist.      Neck: No stridor. Hematological/Lymphatic/Immunilogical: No cervical lymphadenopathy. Cardiovascular: Normal rate, regular rhythm.  No murmurs, rubs, or gallops.  Respiratory: Normal respiratory effort without tachypnea nor retractions. Breath sounds are clear and equal bilaterally. No wheezes/rales/rhonchi. Gastrointestinal: Soft and non tender. No rebound. No guarding.  Genitourinary: Deferred Musculoskeletal: Normal range of motion in all extremities. No lower extremity edema. Neurologic:  Normal speech and language. No gross focal neurologic deficits are appreciated.  Skin:  Skin is warm, dry and intact. No rash noted. Psychiatric: Mood and affect are  normal. Speech and behavior are normal. Patient exhibits appropriate insight and judgment.  ____________________________________________    LABS (pertinent positives/negatives)  CBC wbc 7.0, hgb 13.6, plt 280 BMP na 133, glu 129, otherwise wnl UA clear, unremarkable ____________________________________________   EKG  I, Phineas Semen, attending physician, personally viewed and interpreted this EKG  EKG Time: 1250 Rate: 57 Rhythm: sinus bradycardia Axis: normal Intervals: qtc 492 QRS: wide ST changes: no st elevation Impression: abnormal ekg   ____________________________________________    RADIOLOGY  None   ____________________________________________   PROCEDURES  Procedures  ____________________________________________   INITIAL IMPRESSION / ASSESSMENT AND PLAN / ED COURSE  Pertinent labs & imaging results that were available during my care of the patient were reviewed by me and considered in my medical decision making (see chart for details).   Patient presented to the emergency department today because of concerns for generalized fatigue and concern for EKG changes  by primary care physician.  Apparently patient is EKG today in emergency department previous appears morphology is essentially the same.  Patient had blood work performed without any concerning findings.  She did feel somewhat better after IV fluids.  Patient will follow-up with primary care to see if they want to adjust any of her blood pressure medications.    ____________________________________________   FINAL CLINICAL IMPRESSION(S) / ED DIAGNOSES  Final diagnoses:  Other fatigue     Note: This dictation was prepared with Dragon dictation. Any transcriptional errors that result from this process are unintentional     Phineas Semen, MD 06/03/18 1919

## 2018-06-03 NOTE — ED Triage Notes (Signed)
Pt presents to ED via POV with c/o EKG changes. Pt c/o increasing fatigue over the last few days, pt states "I just want to sleep", pt seen at PCP today who performed EKG, reports new onset LBBB. VSS in triage.

## 2018-06-03 NOTE — Assessment & Plan Note (Signed)
Orthostatic on exam today.  Hold amlodpine and metoprolol as outlined.  Follow pressures.  To ER for further w/up as outlined.

## 2018-06-03 NOTE — Patient Instructions (Signed)
Stop metoprolol   Stop amlodipine

## 2018-06-03 NOTE — ED Notes (Signed)
EDP in with patient 

## 2018-06-03 NOTE — Assessment & Plan Note (Signed)
Recent sodium 134.  Will need f/u labs in ER.

## 2018-06-03 NOTE — Progress Notes (Signed)
Patient ID: Janet Moran, female   DOB: 1925/04/13, 85 y.o.   MRN: [id number]   Subjective:    Patient ID: Janet Moran, female    DOB: 09-25-1925, 85 y.o.   MRN: [id number]  HPI  Patient here for a scheduled follow up.  She is accompanied by her daughter.  History obtained from both of them.  She has noticed feeling more fatigued.  Increased stress with her husband's health issues.  Recently has belt worse.  Today she reports feeling light headed and very "heavy headed".  No chest pain. Breathing stable.  Overall just no energy.  Eating.  No vomiting.  Bowels moving. No diarrhea.  Unsteady with walking.     Past Medical History:  Diagnosis Date  . Arthritis   . Chicken pox   . GERD (gastroesophageal reflux disease)   . Glaucoma   . Hypercholesterolemia   . Hypertension    Past Surgical History:  Procedure Laterality Date  . ABDOMINAL HYSTERECTOMY  1976  . TONSILLECTOMY AND ADENOIDECTOMY  1935   Family History  Problem Relation Age of Onset  . Lung cancer Sister        died 46  . Stroke Mother   . Stroke Father   . Diabetes Maternal Grandmother   . Diabetes Maternal Grandfather   . Breast cancer Maternal Aunt        50's  . Breast cancer Daughter    Social History   Socioeconomic History  . Marital status: Married    Spouse name: Not on file  . Number of children: 5  . Years of education: Not on file  . Highest education level: Not on file  Occupational History  . Not on file  Social Needs  . Financial resource strain: Not on file  . Food insecurity:    Worry: Not on file    Inability: Not on file  . Transportation needs:    Medical: Not on file    Non-medical: Not on file  Tobacco Use  . Smoking status: Never Smoker  . Smokeless tobacco: Never Used  Substance and Sexual Activity  . Alcohol use: No    Alcohol/week: 0.0 standard drinks  . Drug use: No  . Sexual activity: Never  Lifestyle  . Physical activity:    Days per week: Not on file   Minutes per session: Not on file  . Stress: Not on file  Relationships  . Social connections:    Talks on phone: Not on file    Gets together: Not on file    Attends religious service: Not on file    Active member of club or organization: Not on file    Attends meetings of clubs or organizations: Not on file    Relationship status: Not on file  Other Topics Concern  . Not on file  Social History Narrative  . Not on file    Outpatient Encounter Medications as of 06/03/2018  Medication Sig  . amLODipine (NORVASC) 2.5 MG tablet Take 1 tablet (2.5 mg total) by mouth daily.  Marland Kitchen aspirin EC 81 MG tablet Take 81 mg by mouth daily.  Marland Kitchen azelastine (ASTELIN) 0.1 % nasal spray Place 1 spray into both nostrils 2 (two) times daily. Use in each nostril as directed  . calcium carbonate (OS-CAL) 600 MG TABS Take 600 mg by mouth 2 (two) times daily with a meal. Take 1 tablet by mouth twice daily with food  . latanoprost (XALATAN) 0.005 % ophthalmic solution Place 1  drop into both eyes at bedtime.  Marland Kitchen losartan (COZAAR) 50 MG tablet Take 1 tablet (50 mg total) by mouth daily.  . metoprolol succinate (TOPROL-XL) 25 MG 24 hr tablet TAKE 1 TABLET BY MOUTH DAILY  . naproxen sodium (ANAPROX) 220 MG tablet Take 220 mg by mouth 2 (two) times daily with a meal.  . omeprazole (PRILOSEC) 20 MG capsule Take 20 mg by mouth daily.  . simvastatin (ZOCOR) 20 MG tablet Take 1 tablet (20 mg total) by mouth at bedtime.  . timolol (TIMOPTIC) 0.5 % ophthalmic solution   . [DISCONTINUED] benzonatate (TESSALON) 100 MG capsule Take 1 capsule (100 mg total) by mouth 3 (three) times daily as needed.  . [DISCONTINUED] methylPREDNISolone (MEDROL DOSEPAK) 4 MG TBPK tablet Take according to pack instructions   Facility-Administered Encounter Medications as of 06/03/2018  Medication  . cyanocobalamin ((VITAMIN B-12)) injection 1,000 mcg    Review of Systems  Constitutional: Positive for fatigue. Negative for appetite change and  fever.  HENT: Negative for congestion and sinus pressure.   Respiratory: Positive for cough. Negative for chest tightness and shortness of breath.        Some residual cough, but is better.    Cardiovascular: Negative for chest pain, palpitations and leg swelling.  Gastrointestinal: Negative for abdominal pain, diarrhea, nausea and vomiting.  Genitourinary: Negative for difficulty urinating and dysuria.  Musculoskeletal: Negative for joint swelling and myalgias.  Skin: Negative for color change and rash.  Neurological: Positive for dizziness and light-headedness. Negative for headaches.  Psychiatric/Behavioral: Negative for agitation.       Denies depression.        Objective:     Blood pressure standing:  104/68, lying 138/78  Physical Exam Constitutional:      General: She is not in acute distress.    Appearance: Normal appearance.  HENT:     Nose: Nose normal. No congestion.     Mouth/Throat:     Pharynx: No oropharyngeal exudate or posterior oropharyngeal erythema.  Neck:     Musculoskeletal: Neck supple. No muscular tenderness.     Thyroid: No thyromegaly.  Cardiovascular:     Rate and Rhythm: Regular rhythm.     Comments: Ventricular rate - 50s.   Pulmonary:     Effort: No respiratory distress.     Breath sounds: Normal breath sounds. No wheezing.  Abdominal:     General: Bowel sounds are normal.     Palpations: Abdomen is soft.     Tenderness: There is no abdominal tenderness.  Musculoskeletal:        General: No swelling or tenderness.  Lymphadenopathy:     Cervical: No cervical adenopathy.  Skin:    Findings: No erythema or rash.  Neurological:     Mental Status: She is alert.  Psychiatric:        Mood and Affect: Mood normal.        Behavior: Behavior normal.     BP 104/64   Pulse (!) 55   Temp 97.7 F (36.5 C) (Oral)   Resp 16   Wt 142 lb 3.2 oz (64.5 kg)   SpO2 96%   BMI 23.66 kg/m  Wt Readings from Last 3 Encounters:  06/03/18 142 lb 3.2 oz  (64.5 kg)  05/05/18 142 lb (64.4 kg)  02/14/18 145 lb (65.8 kg)     Lab Results  Component Value Date   WBC 5.2 10/08/2017   HGB 14.8 10/08/2017   HCT 43.2 10/08/2017   PLT 213.0  10/08/2017   GLUCOSE 105 (H) 06/02/2018   CHOL 177 06/02/2018   TRIG 137.0 06/02/2018   HDL 54.90 06/02/2018   LDLCALC 94 06/02/2018   ALT 12 06/02/2018   AST 16 06/02/2018   NA 134 (L) 06/02/2018   K 4.2 06/02/2018   CL 97 06/02/2018   CREATININE 0.68 06/02/2018   BUN 12 06/02/2018   CO2 30 06/02/2018   TSH 3.26 10/08/2017    Ct Abdomen Pelvis W Contrast  Result Date: 09/13/2016 CLINICAL DATA:  Diarrhea for 3 weeks. EXAM: CT ABDOMEN AND PELVIS WITH CONTRAST TECHNIQUE: Multidetector CT imaging of the abdomen and pelvis was performed using the standard protocol following bolus administration of intravenous contrast. CONTRAST:  ISOVUE-300 IOPAMIDOL (ISOVUE-300) INJECTION 61% COMPARISON:  09/20/2010. FINDINGS: Lower chest: Lung bases show no acute findings. Heart is mildly enlarged. No pericardial or pleural effusion. Small hiatal hernia. Hepatobiliary: Subcentimeter low-attenuation lesion in the dome of the liver is too small to characterize but likely a benign cyst. Liver and gallbladder are otherwise unremarkable. No biliary ductal dilatation. Pancreas: Negative. Spleen: Negative. Adrenals/Urinary Tract: Adrenal glands and right kidney are unremarkable. There may be a small stone in the lower pole left kidney. Ureters are decompressed. Bladder is grossly unremarkable. Stomach/Bowel: Small hiatal hernia. Stomach, small bowel and colon are unremarkable. Appendix is not well-visualized. Vascular/Lymphatic: Atherosclerotic calcification of the arterial vasculature without abdominal aortic aneurysm. No pathologically enlarged lymph nodes. Reproductive: Hysterectomy.  No adnexal mass. Other: No free fluid. Mesenteries and peritoneum are unremarkable. Small periumbilical hernia contains fat. Musculoskeletal:  Degenerative changes in the spine. IMPRESSION: 1. No findings to explain the patient's diarrhea. 2. There may be a small stone in the lower pole left kidney. 3.  Aortic atherosclerosis (ICD10-170.0). Electronically Signed   By: Leanna Battles M.D.   On: 09/13/2016 14:39       Assessment & Plan:   Problem List Items Addressed This Visit    Aortic atherosclerosis (HCC)    On simvastatin.        Hypercholesterolemia    On simvastatin.  Recent cholesterol panel ok.   Lab Results  Component Value Date   CHOL 177 06/02/2018   HDL 54.90 06/02/2018   LDLCALC 94 06/02/2018   TRIG 137.0 06/02/2018   CHOLHDL 3 06/02/2018        Hypertension    Orthostatic on exam today.  Hold amlodpine and metoprolol as outlined.  Follow pressures.  To ER for further w/up as outlined.        Hyponatremia    Recent sodium 134.  Will need f/u labs in ER.        Weakness    Increased fatigue and weakness as outlined.  EKG with LBBB not appreciated on previous EKGs.  Orthostatic on exam.  Appears not to feel well.  No dysuria.  No abdominal pan.  Cough better.  Ventricular rate in the 50s.  Discussed holding amlodipine and metoprolol.  Given symptoms with new LBBB and orthostatic blood pressure, I do feel she warrants further evaluation in the ER.  ER notified.  Pt and daughter agreeable.         Other Visit Diagnoses    Bradycardia    -  Primary   Hold metoprolol and amlodipine as outlined.  New LBBB.  To ER for further w/up and evaluation.     Relevant Orders   EKG 12-Lead (Completed)   Orthostatic hypotension       Orthostatic and new LBBB.  Hold medication  as outlined.  To ER for further evaluation as outlined.        I spent 40 minutes with the patient and more than 50% of the time was spent in consultation regarding the above.  Time spent discussing her current symptoms and plans for further w/up, treatment and evaluation.    [place], MD

## 2018-06-03 NOTE — Discharge Instructions (Addendum)
Please seek medical attention for any high fevers, chest pain, shortness of breath, change in behavior, persistent vomiting, bloody stool or any other new or concerning symptoms.  

## 2018-06-03 NOTE — Assessment & Plan Note (Signed)
Increased fatigue and weakness as outlined.  EKG with LBBB not appreciated on previous EKGs.  Orthostatic on exam.  Appears not to feel well.  No dysuria.  No abdominal pan.  Cough better.  Ventricular rate in the 50s.  Discussed holding amlodipine and metoprolol.  Given symptoms with new LBBB and orthostatic blood pressure, I do feel she warrants further evaluation in the ER.  ER notified.  Pt and daughter agreeable.

## 2018-06-04 ENCOUNTER — Telehealth: Payer: Self-pay

## 2018-06-04 NOTE — Telephone Encounter (Signed)
I will place the order.  Does she have a preference of which cardiologist she wants to see?

## 2018-06-04 NOTE — Telephone Encounter (Signed)
Copied from CRM [id number]. Topic: General - Other >> Jun 04, 2018  9:12 AM Trula Slade wrote: Reason for CRM:    Patient would like to talk to Dr. Roby Lofts assistant to discuss the patient's BP meds.

## 2018-06-04 NOTE — Telephone Encounter (Signed)
Stop amlodipine and metoprolol.  Will need to follow blood pressures and let us know how doing.  I would also like to schedule a f/u with cardiology.  Let me know if agreeable.

## 2018-06-04 NOTE — Telephone Encounter (Signed)
I remember you telling me that she needed to stop her metoprolol. Is there anything else you were wanting her to stop?

## 2018-06-04 NOTE — Telephone Encounter (Signed)
Tried to return patients call. Unable to leave message.

## 2018-06-04 NOTE — Telephone Encounter (Signed)
Patient is aware and is agreeable to cardiology appt

## 2018-06-05 ENCOUNTER — Other Ambulatory Visit: Payer: Self-pay | Admitting: Internal Medicine

## 2018-06-05 DIAGNOSIS — I447 Left bundle-branch block, unspecified: Secondary | ICD-10-CM

## 2018-06-05 DIAGNOSIS — R5383 Other fatigue: Secondary | ICD-10-CM

## 2018-06-05 NOTE — Progress Notes (Signed)
Order placed for cardiology referral.   

## 2018-06-05 NOTE — Telephone Encounter (Signed)
Order placed for cardiology referral.  Would like appt asap.  Thanks  See me if any problems or questions.

## 2018-06-05 NOTE — Telephone Encounter (Signed)
Pt does not have a preference who she sees

## 2018-06-06 DIAGNOSIS — E7800 Pure hypercholesterolemia, unspecified: Secondary | ICD-10-CM | POA: Diagnosis not present

## 2018-06-06 DIAGNOSIS — I10 Essential (primary) hypertension: Secondary | ICD-10-CM | POA: Diagnosis not present

## 2018-06-06 DIAGNOSIS — I700 Atherosclerosis of aorta: Secondary | ICD-10-CM | POA: Diagnosis not present

## 2018-06-06 DIAGNOSIS — R0602 Shortness of breath: Secondary | ICD-10-CM | POA: Diagnosis not present

## 2018-06-06 NOTE — Telephone Encounter (Signed)
Faxed to [facility]

## 2018-06-06 NOTE — Telephone Encounter (Signed)
Kim at [facility] called to get a copy of pt's EKG done 06/03/18 for referral.  They received notes, but no ekg. Can you please fax to: [phone]  Phone: [phone]

## 2018-06-12 ENCOUNTER — Ambulatory Visit: Payer: PPO | Admitting: Internal Medicine

## 2018-07-22 ENCOUNTER — Other Ambulatory Visit: Payer: Self-pay | Admitting: Internal Medicine

## 2018-07-22 ENCOUNTER — Other Ambulatory Visit: Payer: Self-pay | Admitting: Family Medicine

## 2018-07-22 DIAGNOSIS — E7800 Pure hypercholesterolemia, unspecified: Secondary | ICD-10-CM

## 2018-07-22 DIAGNOSIS — I10 Essential (primary) hypertension: Secondary | ICD-10-CM

## 2018-08-11 ENCOUNTER — Encounter: Payer: Self-pay | Admitting: *Deleted

## 2018-10-17 ENCOUNTER — Other Ambulatory Visit: Payer: Self-pay | Admitting: Internal Medicine

## 2018-10-17 DIAGNOSIS — E7800 Pure hypercholesterolemia, unspecified: Secondary | ICD-10-CM

## 2018-10-17 DIAGNOSIS — I10 Essential (primary) hypertension: Secondary | ICD-10-CM

## 2018-10-24 ENCOUNTER — Other Ambulatory Visit: Payer: Self-pay

## 2018-10-24 ENCOUNTER — Ambulatory Visit (INDEPENDENT_AMBULATORY_CARE_PROVIDER_SITE_OTHER): Payer: PPO

## 2018-10-24 DIAGNOSIS — Z0000 Encounter for general adult medical examination without abnormal findings: Secondary | ICD-10-CM

## 2018-10-24 NOTE — Patient Instructions (Addendum)
  Janet Moran , Thank you for taking time to come for your Medicare Wellness Visit. I appreciate your ongoing commitment to your health goals. Please review the following plan we discussed and let me know if I can assist you in the future.   These are the goals we discussed: Goals      Patient Stated   . Increase physical activity (pt-stated)     Do more standing and chair exercises per week       This is a list of the screening recommended for you and due dates:  Health Maintenance  Topic Date Due  . Tetanus Vaccine  05/22/1946  . Mammogram  02/15/2019*  . Flu Shot  11/08/2018  . DEXA scan (bone density measurement)  Completed  . Pneumonia vaccines  Completed  *Topic was postponed. The date shown is not the original due date.

## 2018-10-24 NOTE — Progress Notes (Addendum)
Subjective:   Janet Moran is a 85 y.o. female who presents for Medicare Annual (Subsequent) preventive examination.  Review of Systems:  No ROS.  Medicare Wellness Virtual Visit.  Visual/audio telehealth visit, UTA vital signs.   See social history for additional risk factors.   Cardiac Risk Factors include: advanced age (>26men, >14 women);hypertension     Objective:     Vitals: There were no vitals taken for this visit.  There is no height or weight on file to calculate BMI.  Advanced Directives 10/24/2018 06/03/2018 10/19/2016 01/30/2015 08/17/2014  Does Patient Have a Medical Advance Directive? Yes Yes Yes Yes Yes  Type of Advance Directive Out of facility DNR (pink MOST or yellow form) Out of facility DNR (pink MOST or yellow form) [facility];Living will - [place];Living will  Does patient want to make changes to medical advance directive? - - No - Patient declined - -    Tobacco Social History   Tobacco Use  Smoking Status Never Smoker  Smokeless Tobacco Never Used     Counseling given: Not Answered   Clinical Intake:  Pre-visit preparation completed: Yes        Diabetes: No  How often do you need to have someone help you when you read instructions, pamphlets, or other written materials from your doctor or pharmacy?: 1 - Never  Interpreter Needed?: No     Past Medical History:  Diagnosis Date  . Arthritis   . Chicken pox   . GERD (gastroesophageal reflux disease)   . Glaucoma   . Hypercholesterolemia   . Hypertension    Past Surgical History:  Procedure Laterality Date  . ABDOMINAL HYSTERECTOMY  1976  . TONSILLECTOMY AND ADENOIDECTOMY  1935   Family History  Problem Relation Age of Onset  . Lung cancer Sister        died 39  . Stroke Mother   . Stroke Father   . Diabetes Maternal Grandmother   . Diabetes Maternal Grandfather   . Breast cancer Maternal Aunt        50's  . Breast cancer Daughter     Social History   Socioeconomic History  . Marital status: Widowed    Spouse name: Not on file  . Number of children: 5  . Years of education: Not on file  . Highest education level: Not on file  Occupational History  . Not on file  Social Needs  . Financial resource strain: Not hard at all  . Food insecurity    Worry: Never true    Inability: Never true  . Transportation needs    Medical: No    Non-medical: No  Tobacco Use  . Smoking status: Never Smoker  . Smokeless tobacco: Never Used  Substance and Sexual Activity  . Alcohol use: No    Alcohol/week: 0.0 standard drinks  . Drug use: No  . Sexual activity: Never  Lifestyle  . Physical activity    Days per week: 0 days    Minutes per session: 0 min  . Stress: Not at all  Relationships  . Social Herbalist on phone: Not on file    Gets together: Not on file    Attends religious service: Not on file    Active member of club or organization: Not on file    Attends meetings of clubs or organizations: Not on file    Relationship status: Not on file  Other Topics Concern  .  Not on file  Social History Narrative  . Not on file    Outpatient Encounter Medications as of 10/24/2018  Medication Sig  . amLODipine (NORVASC) 2.5 MG tablet Take 1 tablet (2.5 mg total) by mouth daily.  Marland Kitchen. aspirin EC 81 MG tablet Take 81 mg by mouth daily.  Marland Kitchen. azelastine (ASTELIN) 0.1 % nasal spray Place 1 spray into both nostrils 2 (two) times daily. Use in each nostril as directed  . calcium carbonate (OS-CAL) 600 MG TABS Take 600 mg by mouth 2 (two) times daily with a meal. Take 1 tablet by mouth twice daily with food  . latanoprost (XALATAN) 0.005 % ophthalmic solution Place 1 drop into both eyes at bedtime.  Marland Kitchen. losartan (COZAAR) 50 MG tablet TAKE 1 TABLET BY MOUTH DAILY  . metoprolol succinate (TOPROL-XL) 25 MG 24 hr tablet TAKE 1 TABLET BY MOUTH DAILY  . naproxen sodium (ANAPROX) 220 MG tablet Take 220 mg by mouth 2 (two) times  daily with a meal.  . omeprazole (PRILOSEC) 20 MG capsule Take 20 mg by mouth daily.  . simvastatin (ZOCOR) 20 MG tablet TAKE ONE TABLET BY MOUTH AT BEDTIME  . timolol (TIMOPTIC) 0.5 % ophthalmic solution Place 1 drop into the right eye daily.    Facility-Administered Encounter Medications as of 10/24/2018  Medication  . cyanocobalamin ((VITAMIN B-12)) injection 1,000 mcg    Activities of Daily Living In your present state of health, do you have any difficulty performing the following activities: 10/24/2018  Hearing? N  Vision? N  Difficulty concentrating or making decisions? N  Walking or climbing stairs? Y  Comment Unsteady gait. Ambulates with walker  Dressing or bathing? N  Doing errands, shopping? Y  Comment She does not Engineer, manufacturingdrive  Preparing Food and eating ? Y  Comment Meals prepared by the facility  Using the Toilet? N  In the past six months, have you accidently leaked urine? N  Do you have problems with loss of bowel control? N  Managing your Medications? N  Managing your Finances? Y  Comment Son assists  Housekeeping or managing your Housekeeping? Y  Comment Maid assist  Some recent data might be hidden    Patient Care Team: Dale DurhamScott, Charlene, MD as PCP - General (Internal Medicine)    Assessment:   This is a routine wellness examination for [name].  I connected with patient 10/24/18 at 12:00 PM EDT by an audio enabled telemedicine application and verified that I am speaking with the correct person using two identifiers. Patient stated full name and DOB. Patient gave permission to continue with virtual visit. Patient's location was at home and Nurse's location was at [place].   Health Screenings  Mammogram - 05/2016 Colonoscopy - 02/2011 Bone Density - 04/2015 Glaucoma -yes, R eye only Hearing -demonstrates normal hearing during visit. TSH- 10/2017 Cholesterol - 05/2018 Dental- visits every 6 months Vision- visits within the last 12 months.  Social  Alcohol  intake - no     Smoking history- never    Smokers in home? none Illicit drug use? none Exercise - chair/standing exercises Diet - healthy Sexually Active -never BMI- discussed the importance of a healthy diet, water intake and the benefits of aerobic exercise.  Educational material provided.   Safety  Patient feels safe at home- yes Patient does have smoke detectors at home- yes Patient does wear sunscreen or protective clothing when in direct sunlight -yes Patient does wear seat belt when in a moving vehicle -yes Patient drives- no  Hallways free of clutter, extension cords, throw rugs etc- yes Adequate lightning- yes Handrails in use when available- yes Ambulates with walker- yes  Covid-19 precautions and sickness symptoms discussed.   Activities of Daily Living Patient denies needing assistance with:feeding themselves, getting from bed to chair, getting to the toilet, bathing/showering or dressing.  No new identified risk were noted.    Depression Screen Patient denies losing interest in daily life, feeling hopeless, or crying easily over simple problems.   Medication-taking as directed and without issues.   Fall Screen Patient denies being afraid of falling or falling in the last year. Ambulates with walker.  Memory Screen Patient is alert.  Correctly identified the president of the [place] and season. Patient likes to read and complete crossword puzzles for brain stimulation.  Immunizations The following Immunizations were discussed: Influenza, shingles, pneumonia, and tetanus.   Other Providers Patient Care Team: Dale DurhamScott, Charlene, MD as PCP - General (Internal Medicine)  Exercise Activities and Dietary recommendations Current Exercise Habits: Home exercise routine, Type of exercise: stretching(chair/standing exercise), Time (Minutes): 30, Frequency (Times/Week): 1, Weekly Exercise (Minutes/Week): 30, Intensity: Mild  Goals      Patient Stated   . Increase physical  activity (pt-stated)     Do more standing and chair exercises per week       Fall Risk Fall Risk  10/24/2018 04/18/2017 10/19/2016 09/12/2016 04/17/2016  Falls in the past year? 0 No No No No  Number falls in past yr: - - - - -  Injury with Fall? - - - - -  Risk for fall due to : - - - - History of fall(s)   Depression Screen PHQ 2/9 Scores 10/24/2018 04/18/2017 10/19/2016 09/12/2016  PHQ - 2 Score 0 0 0 0     Cognitive Function MMSE - Mini Mental State Exam 10/19/2016  Orientation to time 5  Orientation to Place 5  Registration 3  Attention/ Calculation 5  Recall 3  Language- name 2 objects 2  Language- repeat 1  Language- follow 3 step command 3  Language- read & follow direction 1  Write a sentence 1  Copy design 1  Total score 30     6CIT Screen 10/24/2018  What Year? 0 points  What month? 0 points  What time? 0 points  Count back from 20 0 points  Months in reverse 0 points  Repeat phrase 0 points  Total Score 0    Immunization History  Administered Date(s) Administered  . Influenza Split 01/14/2012, 12/15/2013  . Influenza, High Dose Seasonal PF 01/04/2016, 12/07/2016, 02/14/2018  . Influenza,inj,Quad PF,6+ Mos 02/24/2013, 03/15/2015  . Pneumococcal Conjugate-13 01/10/2017  . Pneumococcal Polysaccharide-23 02/14/2018   Screening Tests Health Maintenance  Topic Date Due  . TETANUS/TDAP  05/22/1946  . MAMMOGRAM  02/15/2019 (Originally 05/15/2017)  . INFLUENZA VACCINE  11/08/2018  . DEXA SCAN  Completed  . PNA vac Low Risk Adult  Completed       Plan:    End of life planning; Advance aging; Advanced directives discussed.  Copy of current DNR on file.   I have personally reviewed and noted the following in the patient's chart:   . Medical and social history . Use of alcohol, tobacco or illicit drugs  . Current medications and supplements . Functional ability and status . Nutritional status . Physical activity . Advanced directives . List of other  physicians . Hospitalizations, surgeries, and ER visits in previous 12 months . Vitals . Screenings to include cognitive, depression,  and falls . Referrals and appointments  In addition, I have reviewed and discussed with patient certain preventive protocols, quality metrics, and best practice recommendations. A written personalized care plan for preventive services as well as general preventive health recommendations were provided to patient.     Ashok PallOBrien-Blaney, Maree Ainley L, LPN  1/61/09607/17/2020   Reviewed above information.  Agree with assessment and plan.    Dr Lorin PicketScott

## 2018-10-27 DIAGNOSIS — Z961 Presence of intraocular lens: Secondary | ICD-10-CM | POA: Diagnosis not present

## 2018-11-06 ENCOUNTER — Other Ambulatory Visit: Payer: Self-pay

## 2019-02-05 ENCOUNTER — Other Ambulatory Visit: Payer: Self-pay | Admitting: Internal Medicine

## 2019-02-05 DIAGNOSIS — E7800 Pure hypercholesterolemia, unspecified: Secondary | ICD-10-CM

## 2019-02-11 ENCOUNTER — Other Ambulatory Visit: Payer: Self-pay | Admitting: Internal Medicine

## 2019-02-11 DIAGNOSIS — I10 Essential (primary) hypertension: Secondary | ICD-10-CM

## 2019-04-27 DIAGNOSIS — M3501 Sicca syndrome with keratoconjunctivitis: Secondary | ICD-10-CM | POA: Diagnosis not present

## 2019-08-12 ENCOUNTER — Other Ambulatory Visit: Payer: Self-pay | Admitting: Internal Medicine

## 2019-08-12 DIAGNOSIS — I10 Essential (primary) hypertension: Secondary | ICD-10-CM

## 2019-10-26 DIAGNOSIS — H40053 Ocular hypertension, bilateral: Secondary | ICD-10-CM | POA: Diagnosis not present

## 2019-10-27 ENCOUNTER — Telehealth: Payer: Self-pay | Admitting: Internal Medicine

## 2019-10-27 ENCOUNTER — Ambulatory Visit (INDEPENDENT_AMBULATORY_CARE_PROVIDER_SITE_OTHER): Payer: PPO

## 2019-10-27 VITALS — Ht 65.0 in | Wt 142.0 lb

## 2019-10-27 DIAGNOSIS — Z0000 Encounter for general adult medical examination without abnormal findings: Secondary | ICD-10-CM | POA: Diagnosis not present

## 2019-10-27 NOTE — Patient Instructions (Addendum)
Janet Moran , Thank you for taking time to come for your Medicare Wellness Visit. I appreciate your ongoing commitment to your health goals. Please review the following plan we discussed and let me know if I can assist you in the future.   These are the goals we discussed: Goals      Patient Stated   .  Follow up with primary care provider (pt-stated)      As needed       This is a list of the screening recommended for you and due dates:  Health Maintenance  Topic Date Due  . Tetanus Vaccine  10/26/2020*  . Flu Shot  11/08/2019  . DEXA scan (bone density measurement)  Completed  . COVID-19 Vaccine  Completed  . Pneumonia vaccines  Completed  . Mammogram  Discontinued  *Topic was postponed. The date shown is not the original due date.    Immunizations Immunization History  Administered Date(s) Administered  . Influenza Split 01/14/2012, 12/15/2013  . Influenza, High Dose Seasonal PF 01/04/2016, 12/07/2016, 02/14/2018  . Influenza,inj,Quad PF,6+ Mos 02/24/2013, 03/15/2015  . Pneumococcal Conjugate-13 01/10/2017  . Pneumococcal Polysaccharide-23 02/14/2018   Advanced directives: yes on file  Conditions/risks identified: none new  Follow up in one year for your annual wellness visit   Keep all routine maintenance appointments.   Follow up 11/10/19 @ 11:00   Preventive Care 65 Years and Older, Female Preventive care refers to lifestyle choices and visits with your health care provider that can promote health and wellness. What does preventive care include?  A yearly physical exam. This is also called an annual well check.  Dental exams once or twice a year.  Routine eye exams. Ask your health care provider how often you should have your eyes checked.  Personal lifestyle choices, including:  Daily care of your teeth and gums.  Regular physical activity.  Eating a healthy diet.  Avoiding tobacco and drug use.  Limiting alcohol use.  Practicing safe  sex.  Taking low-dose aspirin every day.  Taking vitamin and mineral supplements as recommended by your health care provider. What happens during an annual well check? The services and screenings done by your health care provider during your annual well check will depend on your age, overall health, lifestyle risk factors, and family history of disease. Counseling  Your health care provider may ask you questions about your:  Alcohol use.  Tobacco use.  Drug use.  Emotional well-being.  Home and relationship well-being.  Sexual activity.  Eating habits.  History of falls.  Memory and ability to understand (cognition).  Work and work Astronomer.  Reproductive health. Screening  You may have the following tests or measurements:  Height, weight, and BMI.  Blood pressure.  Lipid and cholesterol levels. These may be checked every 5 years, or more frequently if you are over 61 years old.  Skin check.  Lung cancer screening. You may have this screening every year starting at age 81 if you have a 30-pack-year history of smoking and currently smoke or have quit within the past 15 years.  Fecal occult blood test (FOBT) of the stool. You may have this test every year starting at age 20.  Flexible sigmoidoscopy or colonoscopy. You may have a sigmoidoscopy every 5 years or a colonoscopy every 10 years starting at age 8.  Hepatitis C blood test.  Hepatitis B blood test.  Sexually transmitted disease (STD) testing.  Diabetes screening. This is done by checking your blood sugar (glucose)  after you have not eaten for a while (fasting). You may have this done every 1-3 years.  Bone density scan. This is done to screen for osteoporosis. You may have this done starting at age 94.  Mammogram. This may be done every 1-2 years. Talk to your health care provider about how often you should have regular mammograms. Talk with your health care provider about your test results,  treatment options, and if necessary, the need for more tests. Vaccines  Your health care provider may recommend certain vaccines, such as:  Influenza vaccine. This is recommended every year.  Tetanus, diphtheria, and acellular pertussis (Tdap, Td) vaccine. You may need a Td booster every 10 years.  Zoster vaccine. You may need this after age 5.  Pneumococcal 13-valent conjugate (PCV13) vaccine. One dose is recommended after age 69.  Pneumococcal polysaccharide (PPSV23) vaccine. One dose is recommended after age 42. Talk to your health care provider about which screenings and vaccines you need and how often you need them. This information is not intended to replace advice given to you by your health care provider. Make sure you discuss any questions you have with your health care provider. Document Released: 04/22/2015 Document Revised: 12/14/2015 Document Reviewed: 01/25/2015 Elsevier Interactive Patient Education  2017 [place] Prevention in the Home Falls can cause injuries. They can happen to people of all ages. There are many things you can do to make your home safe and to help prevent falls. What can I do on the outside of my home?  Regularly fix the edges of walkways and driveways and fix any cracks.  Remove anything that might make you trip as you walk through a door, such as a raised step or threshold.  Trim any bushes or trees on the path to your home.  Use bright outdoor lighting.  Clear any walking paths of anything that might make someone trip, such as rocks or tools.  Regularly check to see if handrails are loose or broken. Make sure that both sides of any steps have handrails.  Any raised decks and porches should have guardrails on the edges.  Have any leaves, snow, or ice cleared regularly.  Use sand or salt on walking paths during winter.  Clean up any spills in your garage right away. This includes oil or grease spills. What can I do in the  bathroom?  Use night lights.  Install grab bars by the toilet and in the tub and shower. Do not use towel bars as grab bars.  Use non-skid mats or decals in the tub or shower.  If you need to sit down in the shower, use a plastic, non-slip stool.  Keep the floor dry. Clean up any water that spills on the floor as soon as it happens.  Remove soap buildup in the tub or shower regularly.  Attach bath mats securely with double-sided non-slip rug tape.  Do not have throw rugs and other things on the floor that can make you trip. What can I do in the bedroom?  Use night lights.  Make sure that you have a light by your bed that is easy to reach.  Do not use any sheets or blankets that are too big for your bed. They should not hang down onto the floor.  Have a firm chair that has side arms. You can use this for support while you get dressed.  Do not have throw rugs and other things on the floor that can make you  trip. What can I do in the kitchen?  Clean up any spills right away.  Avoid walking on wet floors.  Keep items that you use a lot in easy-to-reach places.  If you need to reach something above you, use a strong step stool that has a grab bar.  Keep electrical cords out of the way.  Do not use floor polish or wax that makes floors slippery. If you must use wax, use non-skid floor wax.  Do not have throw rugs and other things on the floor that can make you trip. What can I do with my stairs?  Do not leave any items on the stairs.  Make sure that there are handrails on both sides of the stairs and use them. Fix handrails that are broken or loose. Make sure that handrails are as long as the stairways.  Check any carpeting to make sure that it is firmly attached to the stairs. Fix any carpet that is loose or worn.  Avoid having throw rugs at the top or bottom of the stairs. If you do have throw rugs, attach them to the floor with carpet tape.  Make sure that you have a  light switch at the top of the stairs and the bottom of the stairs. If you do not have them, ask someone to add them for you. What else can I do to help prevent falls?  Wear shoes that:  Do not have high heels.  Have rubber bottoms.  Are comfortable and fit you well.  Are closed at the toe. Do not wear sandals.  If you use a stepladder:  Make sure that it is fully opened. Do not climb a closed stepladder.  Make sure that both sides of the stepladder are locked into place.  Ask someone to hold it for you, if possible.  Clearly mark and make sure that you can see:  Any grab bars or handrails.  First and last steps.  Where the edge of each step is.  Use tools that help you move around (mobility aids) if they are needed. These include:  Canes.  Walkers.  Scooters.  Crutches.  Turn on the lights when you go into a dark area. Replace any light bulbs as soon as they burn out.  Set up your furniture so you have a clear path. Avoid moving your furniture around.  If any of your floors are uneven, fix them.  If there are any pets around you, be aware of where they are.  Review your medicines with your doctor. Some medicines can make you feel dizzy. This can increase your chance of falling. Ask your doctor what other things that you can do to help prevent falls. This information is not intended to replace advice given to you by your health care provider. Make sure you discuss any questions you have with your health care provider. Document Released: 01/20/2009 Document Revised: 09/01/2015 Document Reviewed: 04/30/2014 Elsevier Interactive Patient Education  2017 [employer].

## 2019-10-27 NOTE — Telephone Encounter (Signed)
Reviewed Janet Moran's note.  Pt out of medication and blood pressure elevated.  Is currently scheduled an appt for 11/10/19.  I can work her in this week.  Please schedule appt and confirm pt doing ok.  No acute symptoms.

## 2019-10-27 NOTE — Telephone Encounter (Signed)
Left message to call back  

## 2019-10-27 NOTE — Progress Notes (Addendum)
Subjective:   Janet Moran is a 86 y.o. female who presents for Medicare Annual (Subsequent) preventive examination.  Review of Systems    No ROS.  Medicare Wellness Virtual Visit.       Objective:    Today's Vitals   10/27/19 1137  Weight: 142 lb (64.4 kg)  Height: 5\' 5"  (1.651 m)   Body mass index is 23.63 kg/m.  Advanced Directives 10/27/2019 10/24/2018 06/03/2018 10/19/2016 01/30/2015 08/17/2014  Does Patient Have a Medical Advance Directive? Yes Yes Yes Yes Yes Yes  Type of 10/17/2014 of [place];Living will Out of facility DNR (pink MOST or yellow form) Out of facility DNR (pink MOST or yellow form) Healthcare Power of [place];Living will - Healthcare Power of [place];Living will  Does patient want to make changes to medical advance directive? No - Patient declined - - No - Patient declined - -  Copy of Healthcare Power of Attorney in Chart? Yes - validated most recent copy scanned in chart (See row information) - - - - -    Current Medications (verified) Outpatient Encounter Medications as of 10/27/2019  Medication Sig  . aspirin EC 81 MG tablet Take 81 mg by mouth daily.  10/29/2019 azelastine (ASTELIN) 0.1 % nasal spray Place 1 spray into both nostrils 2 (two) times daily. Use in each nostril as directed  . calcium carbonate (OS-CAL) 600 MG TABS Take 600 mg by mouth 2 (two) times daily with a meal. Take 1 tablet by mouth twice daily with food  . latanoprost (XALATAN) 0.005 % ophthalmic solution Place 1 drop into both eyes at bedtime.  Marland Kitchen losartan (COZAAR) 50 MG tablet TAKE ONE TABLET BY MOUTH EVERY DAY  . naproxen sodium (ANAPROX) 220 MG tablet Take 220 mg by mouth 2 (two) times daily with a meal.  . omeprazole (PRILOSEC) 20 MG capsule Take 20 mg by mouth daily.  . simvastatin (ZOCOR) 20 MG tablet TAKE ONE TABLET BY MOUTH AT BEDTIME  . timolol (TIMOPTIC) 0.5 % ophthalmic solution Place 1 drop into the right eye daily.   Marland Kitchen amLODipine (NORVASC) 2.5 MG  tablet Take 1 tablet (2.5 mg total) by mouth daily. (Patient not taking: Reported on 10/27/2019)  . metoprolol succinate (TOPROL-XL) 25 MG 24 hr tablet TAKE 1 TABLET BY MOUTH DAILY (Patient not taking: Reported on 10/27/2019)   Facility-Administered Encounter Medications as of 10/27/2019  Medication  . cyanocobalamin ((VITAMIN B-12)) injection 1,000 mcg    Allergies (verified) Avelox [moxifloxacin], Crestor [rosuvastatin], Pravachol [pravastatin], and Vytorin [ezetimibe-simvastatin]   History: Past Medical History:  Diagnosis Date  . Arthritis   . Chicken pox   . GERD (gastroesophageal reflux disease)   . Glaucoma   . Hypercholesterolemia   . Hypertension    Past Surgical History:  Procedure Laterality Date  . ABDOMINAL HYSTERECTOMY  1976  . TONSILLECTOMY AND ADENOIDECTOMY  1935   Family History  Problem Relation Age of Onset  . Lung cancer Sister        died 67  . Stroke Mother   . Stroke Father   . Diabetes Maternal Grandmother   . Diabetes Maternal Grandfather   . Breast cancer Maternal Aunt        50's  . Breast cancer Daughter    Social History   Socioeconomic History  . Marital status: Widowed    Spouse name: Not on file  . Number of children: 5  . Years of education: Not on file  . Highest education level: Not on  file  Occupational History  . Not on file  Tobacco Use  . Smoking status: Never Smoker  . Smokeless tobacco: Never Used  Substance and Sexual Activity  . Alcohol use: No    Alcohol/week: 0.0 standard drinks  . Drug use: No  . Sexual activity: Never  Other Topics Concern  . Not on file  Social History Narrative  . Not on file   Social Determinants of Health   Financial Resource Strain:   . Difficulty of Paying Living Expenses:   Food Insecurity:   . Worried About Programme researcher, broadcasting/film/video in the Last Year:   . Barista in the Last Year:   Transportation Needs:   . Freight forwarder (Medical):   Marland Kitchen Lack of Transportation  (Non-Medical):   Physical Activity:   . Days of Exercise per Week:   . Minutes of Exercise per Session:   Stress:   . Feeling of Stress :   Social Connections:   . Frequency of Communication with Friends and Family:   . Frequency of Social Gatherings with Friends and Family:   . Attends Religious Services:   . Active Member of Clubs or Organizations:   . Attends Banker Meetings:   Marland Kitchen Marital Status:     Tobacco Counseling Counseling given: Not Answered   Clinical Intake:  Pre-visit preparation completed: Yes        Diabetes: No  How often do you need to have someone help you when you read instructions, pamphlets, or other written materials from your doctor or pharmacy?: 1 - Never  Interpreter Needed?: No      Activities of Daily Living In your present state of health, do you have any difficulty performing the following activities: 10/27/2019  Hearing? N  Vision? N  Difficulty concentrating or making decisions? N  Walking or climbing stairs? Y  Comment Walker in use when ambulating  Dressing or bathing? N  Doing errands, shopping? Y  Comment She does not Engineer, manufacturing and eating ? N  Using the Toilet? N  In the past six months, have you accidently leaked urine? Y  Comment Managed by daily liner  Do you have problems with loss of bowel control? N  Managing your Medications? N  Managing your Finances? Y  Comment Son assists  Housekeeping or managing your Housekeeping? Y  Comment Maid assist  Some recent data might be hidden    Patient Care Team: Dale [place], MD as PCP - General (Internal Medicine)  Indicate any recent Medical Services you may have received from other than [facility] providers in the past year (date may be approximate).     Assessment:   This is a routine wellness examination for [place].  I connected with Harriett Sine today by telephone and verified that I am speaking with the correct person using two identifiers. Location  patient: home Location provider: work Persons participating in the virtual visit: patient, Engineer, civil (consulting).    I discussed the limitations, risks, security and privacy concerns of performing an evaluation and management service by telephone and the availability of in person appointments. The patient expressed understanding and verbally consented to this telephonic visit.    Interactive audio and video telecommunications were attempted between this provider and patient, however failed, due to patient having technical difficulties OR patient did not have access to video capability.  We continued and completed visit with audio only.  Some vital signs may be absent or patient reported.   Hearing/Vision  screen  Hearing Screening   125Hz  250Hz  500Hz  1000Hz  2000Hz  3000Hz  4000Hz  6000Hz  8000Hz   Right ear:           Left ear:           Comments: Patient is able to hear conversational tones without difficulty.  No issues reported.  Vision Screening Comments: Followed by [facility] Wears corrective lenses Cataract extraction, bilateral Visual acuity not assessed, virtual visit.  They have seen their ophthalmologist in the last 12 months.     Dietary issues and exercise activities discussed: Current Exercise Habits: The patient does not participate in regular exercise at present  Goals      Patient Stated   .  Follow up with primary care provider (pt-stated)      As needed      Depression Screen PHQ 2/9 Scores 10/27/2019 10/24/2018 04/18/2017 10/19/2016 09/12/2016 04/17/2016 01/04/2016  PHQ - 2 Score 0 0 0 0 0 0 0    Fall Risk Fall Risk  10/27/2019 11/06/2018 10/24/2018 04/18/2017 10/19/2016  Falls in the past year? 0 0 0 No No  Comment - Emmi Telephone Survey: data to providers prior to load - - -  Number falls in past yr: 0 - - - -  Injury with Fall? - - - - -  Risk for fall due to : - - - - -  Follow up Falls evaluation completed - - - -    Handrails in use when climbing stairs? Yes  If so,  are there any without handrails? Yes  Home free of loose throw rugs in walkways, pet beds, electrical cords, etc? Yes  Adequate lighting in your home to reduce risk of falls? Yes   ASSISTIVE DEVICES UTILIZED TO PREVENT FALLS:  Life alert? Yes   Use of a cane, walker or w/c? Yes , walker in use Grab bars in the bathroom? Yes  Shower chair or bench in shower? Yes  Elevated toilet seat or a handicapped toilet? Yes   TIMED UP AND GO:  Was the test performed? No .   Cognitive Function: MMSE - Mini Mental State Exam 10/19/2016  Orientation to time 5  Orientation to Place 5  Registration 3  Attention/ Calculation 5  Recall 3  Language- name 2 objects 2  Language- repeat 1  Language- follow 3 step command 3  Language- read & follow direction 1  Write a sentence 1  Copy design 1  Total score 30     6CIT Screen 10/27/2019 10/24/2018  What Year? 0 points 0 points  What month? 0 points 0 points  What time? 0 points 0 points  Count back from 20 0 points 0 points  Months in reverse 0 points 0 points  Repeat phrase 0 points 0 points  Total Score 0 0    Immunizations Immunization History  Administered Date(s) Administered  . Influenza Split 01/14/2012, 12/15/2013  . Influenza, High Dose Seasonal PF 01/04/2016, 12/07/2016, 02/14/2018  . Influenza,inj,Quad PF,6+ Mos 02/24/2013, 03/15/2015  . PFIZER SARS-COV-2 Vaccination 04/15/2019, 05/13/2019  . Pneumococcal Conjugate-13 01/10/2017  . Pneumococcal Polysaccharide-23 02/14/2018    TDAP status: Due, Education has been provided regarding the importance of this vaccine. Advised may receive this vaccine at local pharmacy or Health Dept. Aware to provide a copy of the vaccination record if obtained from local pharmacy or Health Dept. Verbalized acceptance and understanding. Deferred.  Mammogram- discontinued. Aged out and patient preference.   Health Maintenance Health Maintenance  Topic Date Due  .  TETANUS/TDAP  10/26/2020  (Originally 05/22/1944)  . INFLUENZA VACCINE  11/08/2019  . DEXA SCAN  Completed  . COVID-19 Vaccine  Completed  . PNA vac Low Risk Adult  Completed  . MAMMOGRAM  Discontinued   Dental Screening: Recommended annual dental exams for proper oral hygiene.   Community Resource Referral / Chronic Care Management: CRR required this visit?  No   CCM required this visit?  No      Plan:   Keep all routine maintenance appointments.   Follow up 11/10/19 @ 11:00  I have personally reviewed and noted the following in the patient's chart:   . Medical and social history . Use of alcohol, tobacco or illicit drugs  . Current medications and supplements . Functional ability and status . Nutritional status . Physical activity . Advanced directives . List of other physicians . Hospitalizations, surgeries, and ER visits in previous 12 months . Vitals . Screenings to include cognitive, depression, and falls . Referrals and appointments  In addition, I have reviewed and discussed with patient certain preventive protocols, quality metrics, and best practice recommendations. A written personalized care plan for preventive services as well as general preventive health recommendations were provided to patient.     Ashok Pall, LPN   [id number]   Nurse Notes: Patient states she misplaced amlodipine and metoprolol over a year ago and has not taken since 04/2018. Notes she thought she felt fine all this time and now questions if she should restart the medications. BP taken this morning 158/83, yesterday 168/71. Denies any accompanying symptoms. Last office visit 05/2018. Followed up scheduled 11/10/19 @ 11:00. Deferred to pcp for follow up.  Reviewed above information.  Reviewed above blood pressure readings.  Needs a f/u appt with me.  I can see her this week for assessment of her blood pressure and medication adjustment.  Confirm doing ok.    Dr Lorin Picket

## 2019-11-03 ENCOUNTER — Other Ambulatory Visit: Payer: Self-pay | Admitting: Internal Medicine

## 2019-11-03 DIAGNOSIS — E7800 Pure hypercholesterolemia, unspecified: Secondary | ICD-10-CM

## 2019-11-04 NOTE — Telephone Encounter (Signed)
Left message to call back  

## 2019-11-10 ENCOUNTER — Other Ambulatory Visit: Payer: Self-pay

## 2019-11-10 ENCOUNTER — Ambulatory Visit (INDEPENDENT_AMBULATORY_CARE_PROVIDER_SITE_OTHER): Payer: PPO | Admitting: Internal Medicine

## 2019-11-10 ENCOUNTER — Encounter: Payer: Self-pay | Admitting: Internal Medicine

## 2019-11-10 DIAGNOSIS — E871 Hypo-osmolality and hyponatremia: Secondary | ICD-10-CM

## 2019-11-10 DIAGNOSIS — R0602 Shortness of breath: Secondary | ICD-10-CM | POA: Diagnosis not present

## 2019-11-10 DIAGNOSIS — I10 Essential (primary) hypertension: Secondary | ICD-10-CM | POA: Diagnosis not present

## 2019-11-10 DIAGNOSIS — E7800 Pure hypercholesterolemia, unspecified: Secondary | ICD-10-CM | POA: Diagnosis not present

## 2019-11-10 DIAGNOSIS — K219 Gastro-esophageal reflux disease without esophagitis: Secondary | ICD-10-CM | POA: Diagnosis not present

## 2019-11-10 DIAGNOSIS — I700 Atherosclerosis of aorta: Secondary | ICD-10-CM | POA: Diagnosis not present

## 2019-11-10 LAB — LIPID PANEL
Cholesterol: 182 mg/dL (ref 0–200)
HDL: 59.2 mg/dL (ref >39.00)
NonHDL: 123.02
Total CHOL/HDL Ratio: 3
Triglycerides: 208 mg/dL — ABNORMAL HIGH (ref 0.0–149.0)
VLDL: 41.6 mg/dL — ABNORMAL HIGH (ref 0.0–40.0)

## 2019-11-10 LAB — LDL CHOLESTEROL, DIRECT: Direct LDL: 99 mg/dL

## 2019-11-10 LAB — CBC WITH DIFFERENTIAL/PLATELET
Basophils Absolute: 0.1 10*3/uL (ref 0.0–0.1)
Basophils Relative: 1 % (ref 0.0–3.0)
Eosinophils Absolute: 0.2 10*3/uL (ref 0.0–0.7)
Eosinophils Relative: 3 % (ref 0.0–5.0)
HCT: 42.1 % (ref 36.0–46.0)
Hemoglobin: 14.2 g/dL (ref 12.0–15.0)
Lymphocytes Relative: 22 % (ref 12.0–46.0)
Lymphs Abs: 1.4 10*3/uL (ref 0.7–4.0)
MCHC: 33.7 g/dL (ref 30.0–36.0)
MCV: 94.3 fl (ref 78.0–100.0)
Monocytes Absolute: 0.7 10*3/uL (ref 0.1–1.0)
Monocytes Relative: 10.7 % (ref 3.0–12.0)
Neutro Abs: 3.9 10*3/uL (ref 1.4–7.7)
Neutrophils Relative %: 63.3 % (ref 43.0–77.0)
Platelets: 185 10*3/uL (ref 150.0–400.0)
RBC: 4.46 Mil/uL (ref 3.87–5.11)
RDW: 13.2 % (ref 11.5–15.5)
WBC: 6.2 10*3/uL (ref 4.0–10.5)

## 2019-11-10 LAB — BASIC METABOLIC PANEL
BUN: 13 mg/dL (ref 6–23)
CO2: 29 mEq/L (ref 19–32)
Calcium: 9.9 mg/dL (ref 8.4–10.5)
Chloride: 103 mEq/L (ref 96–112)
Creatinine, Ser: 0.56 mg/dL (ref 0.40–1.20)
GFR: 101.14 mL/min (ref >60.00)
Glucose, Bld: 83 mg/dL (ref 70–99)
Potassium: 4.1 mEq/L (ref 3.5–5.1)
Sodium: 135 mEq/L (ref 135–145)

## 2019-11-10 LAB — HEPATIC FUNCTION PANEL
ALT: 18 U/L (ref 0–35)
AST: 17 U/L (ref 0–37)
Albumin: 3.9 g/dL (ref 3.5–5.2)
Alkaline Phosphatase: 70 U/L (ref 39–117)
Bilirubin, Direct: 0.1 mg/dL (ref 0.0–0.3)
Total Bilirubin: 0.5 mg/dL (ref 0.2–1.2)
Total Protein: 6.2 g/dL (ref 6.0–8.3)

## 2019-11-10 LAB — TSH: TSH: 2.52 u[IU]/mL (ref 0.35–4.50)

## 2019-11-10 NOTE — Progress Notes (Signed)
Patient ID: Janet Moran, female   DOB: September 14, 1925, 86 y.o.   MRN: [id number]   Subjective:    Patient ID: Janet Moran, female    DOB: [birth year], 86 y.o.   MRN: [id number]  HPI This visit occurred during the SARS-CoV-2 public health emergency.  Safety protocols were in place, including screening questions prior to the visit, additional usage of staff PPE, and extensive cleaning of exam room while observing appropriate contact time as indicated for disinfecting solutions.  Patient here for a scheduled follow up.  Have not seen her since 05/2018.  Living at [facility] now.  Walks to get her evening meal.  No chest pain.  Noticed some increased sob.  No significant cough.  No abdominal pain or bowel change.  Handling stress.  Sleeping is better.  Only taking losartan now.  Off metoprolol and amlodipine.  Handling stress.    Past Medical History:  Diagnosis Date  . Arthritis   . Chicken pox   . GERD (gastroesophageal reflux disease)   . Glaucoma   . Hypercholesterolemia   . Hypertension    Past Surgical History:  Procedure Laterality Date  . ABDOMINAL HYSTERECTOMY  1976  . TONSILLECTOMY AND ADENOIDECTOMY  1935   Family History  Problem Relation Age of Onset  . Lung cancer Sister        died 17  . Stroke Mother   . Stroke Father   . Diabetes Maternal Grandmother   . Diabetes Maternal Grandfather   . Breast cancer Maternal Aunt        50's  . Breast cancer Daughter    Social History   Socioeconomic History  . Marital status: Widowed    Spouse name: Not on file  . Number of children: 5  . Years of education: Not on file  . Highest education level: Not on file  Occupational History  . Not on file  Tobacco Use  . Smoking status: Never Smoker  . Smokeless tobacco: Never Used  Substance and Sexual Activity  . Alcohol use: No    Alcohol/week: 0.0 standard drinks  . Drug use: No  . Sexual activity: Never  Other Topics Concern  . Not on file  Social History  Narrative  . Not on file   Social Determinants of Health   Financial Resource Strain:   . Difficulty of Paying Living Expenses: Not on file  Food Insecurity:   . Worried About Programme researcher, broadcasting/film/video in the Last Year: Not on file  . Ran Out of Food in the Last Year: Not on file  Transportation Needs:   . Lack of Transportation (Medical): Not on file  . Lack of Transportation (Non-Medical): Not on file  Physical Activity:   . Days of Exercise per Week: Not on file  . Minutes of Exercise per Session: Not on file  Stress:   . Feeling of Stress : Not on file  Social Connections:   . Frequency of Communication with Friends and Family: Not on file  . Frequency of Social Gatherings with Friends and Family: Not on file  . Attends Religious Services: Not on file  . Active Member of Clubs or Organizations: Not on file  . Attends Banker Meetings: Not on file  . Marital Status: Not on file    Outpatient Encounter Medications as of 11/10/2019  Medication Sig  . amLODipine (NORVASC) 2.5 MG tablet Take 1 tablet (2.5 mg total) by mouth daily.  Marland Kitchen aspirin EC 81  MG tablet Take 81 mg by mouth daily.  Marland Kitchen azelastine (ASTELIN) 0.1 % nasal spray Place 1 spray into both nostrils 2 (two) times daily. Use in each nostril as directed  . CALCIUM 500/D 500-400 MG-UNIT tablet SMARTSIG:By Mouth  . latanoprost (XALATAN) 0.005 % ophthalmic solution Place 1 drop into both eyes at bedtime.  Marland Kitchen losartan (COZAAR) 50 MG tablet TAKE ONE TABLET BY MOUTH EVERY DAY  . metoprolol succinate (TOPROL-XL) 25 MG 24 hr tablet TAKE 1 TABLET BY MOUTH DAILY (Patient not taking: Reported on 10/27/2019)  . Multiple Vitamins-Minerals (PRESERVISION AREDS 2) CAPS Take 1 capsule by mouth 2 (two) times daily.  . naproxen sodium (ANAPROX) 220 MG tablet Take 220 mg by mouth 2 (two) times daily with a meal.  . omeprazole (PRILOSEC) 20 MG capsule Take 20 mg by mouth daily.  . simvastatin (ZOCOR) 20 MG tablet TAKE ONE TABLET BY MOUTH AT  BEDTIME  . timolol (TIMOPTIC) 0.5 % ophthalmic solution Place 1 drop into the right eye daily.   Marland Kitchen ZYRTEC ALLERGY 10 MG tablet Take by mouth.  . [DISCONTINUED] amLODipine (NORVASC) 2.5 MG tablet Take 1 tablet (2.5 mg total) by mouth daily. (Patient not taking: Reported on 10/27/2019)  . [DISCONTINUED] calcium carbonate (OS-CAL) 600 MG TABS Take 600 mg by mouth 2 (two) times daily with a meal. Take 1 tablet by mouth twice daily with food   Facility-Administered Encounter Medications as of 11/10/2019  Medication  . cyanocobalamin ((VITAMIN B-12)) injection 1,000 mcg    Review of Systems  Constitutional: Positive for fatigue. Negative for appetite change and unexpected weight change.       Has gained some weight.   HENT: Negative for congestion and sinus pressure.   Respiratory: Negative for cough and chest tightness.        She denies any increased sob, but has noticed some sob with exertion.   Cardiovascular: Negative for chest pain, palpitations and leg swelling.  Gastrointestinal: Negative for abdominal pain, diarrhea, nausea and vomiting.  Genitourinary: Negative for difficulty urinating and dysuria.  Musculoskeletal: Negative for joint swelling and myalgias.  Skin: Negative for color change and rash.  Neurological: Negative for dizziness, light-headedness and headaches.  Psychiatric/Behavioral: Negative for agitation and dysphoric mood.       Objective:    Physical Exam Vitals reviewed.  Constitutional:      General: She is not in acute distress.    Appearance: Normal appearance.  HENT:     Head: Normocephalic and atraumatic.     Right Ear: External ear normal.     Left Ear: External ear normal.  Eyes:     General: No scleral icterus.       Right eye: No discharge.        Left eye: No discharge.     Conjunctiva/sclera: Conjunctivae normal.  Neck:     Thyroid: No thyromegaly.  Cardiovascular:     Rate and Rhythm: Normal rate and regular rhythm.  Pulmonary:     Effort:  No respiratory distress.     Breath sounds: Normal breath sounds. No wheezing.  Abdominal:     General: Bowel sounds are normal.     Palpations: Abdomen is soft.     Tenderness: There is no abdominal tenderness.  Musculoskeletal:        General: No swelling or tenderness.     Cervical back: Neck supple. No tenderness.  Lymphadenopathy:     Cervical: No cervical adenopathy.  Skin:    Findings: No erythema or  rash.  Neurological:     Mental Status: She is alert.  Psychiatric:        Mood and Affect: Mood normal.        Behavior: Behavior normal.     BP (!) 166/72   Pulse 73   Temp 98.2 F (36.8 C) (Oral)   Resp 16   Ht 5\' 5"  (1.651 m)   Wt 148 lb 3.2 oz (67.2 kg)   SpO2 98%   BMI 24.66 kg/m  Wt Readings from Last 3 Encounters:  11/10/19 148 lb 3.2 oz (67.2 kg)  10/27/19 142 lb (64.4 kg)  06/03/18 142 lb 3.2 oz (64.5 kg)  `   Lab Results  Component Value Date   WBC 6.2 11/10/2019   HGB 14.2 11/10/2019   HCT 42.1 11/10/2019   PLT 185.0 11/10/2019   GLUCOSE 83 11/10/2019   CHOL 182 11/10/2019   TRIG 208.0 (H) 11/10/2019   HDL 59.20 11/10/2019   LDLDIRECT 99.0 11/10/2019   LDLCALC 94 06/02/2018   ALT 18 11/10/2019   AST 17 11/10/2019   NA 135 11/10/2019   K 4.1 11/10/2019   CL 103 11/10/2019   CREATININE 0.56 11/10/2019   BUN 13 11/10/2019   CO2 29 11/10/2019   TSH 2.52 11/10/2019       Assessment & Plan:   Problem List Items Addressed This Visit    SOB (shortness of breath)    Given sob with exertion, EKG - SR with some ST depression in I and AVL.  Discussed further cardiac w/up.  Discussed with cardiology and they offered appt today.  She declined.  Desires no further cardiac w/up.  Also discussed checking cxr.  She declines.  Desires no further w/up.        Relevant Orders   EKG 12-Lead (Completed)   DG Chest 2 View   Hyponatremia   Hypertension    Blood pressure elevated.  On losartan.  Add amlodipine 2.5mg  q day.  Follow pressures.  Follow  metabolic panel.       Relevant Medications   amLODipine (NORVASC) 2.5 MG tablet   Other Relevant Orders   CBC with Differential/Platelet (Completed)   TSH (Completed)   Basic metabolic panel (Completed)   Hypercholesterolemia    On simvastatin.  Low cholesterol diet and exercise.  Follow lipid panel and liver function tests.        Relevant Medications   amLODipine (NORVASC) 2.5 MG tablet   Other Relevant Orders   Hepatic function panel (Completed)   Lipid panel (Completed)   GERD (gastroesophageal reflux disease)    Controlled on prilosec.        Aortic atherosclerosis (HCC)    On simvastatin.       Relevant Medications   amLODipine (NORVASC) 2.5 MG tablet       01/10/2020, MD

## 2019-11-11 MED ORDER — AMLODIPINE BESYLATE 2.5 MG PO TABS: 2.5000 mg | ORAL_TABLET | Freq: Every day | ORAL | 2 refills | Status: DC

## 2019-11-28 ENCOUNTER — Encounter: Payer: Self-pay | Admitting: Internal Medicine

## 2019-11-28 NOTE — Assessment & Plan Note (Signed)
Blood pressure elevated.  On losartan.  Add amlodipine 2.5mg  q day.  Follow pressures.  Follow metabolic panel.

## 2019-11-28 NOTE — Assessment & Plan Note (Signed)
Given sob with exertion, EKG - SR with some ST depression in I and AVL.  Discussed further cardiac w/up.  Discussed with cardiology and they offered appt today.  She declined.  Desires no further cardiac w/up.  Also discussed checking cxr.  She declines.  Desires no further w/up.

## 2019-11-28 NOTE — Assessment & Plan Note (Signed)
On simvastatin.   

## 2019-11-28 NOTE — Assessment & Plan Note (Signed)
Controlled on prilosec.   

## 2019-11-28 NOTE — Assessment & Plan Note (Signed)
On simvastatin.  Low cholesterol diet and exercise.  Follow lipid panel and liver function tests.   

## 2019-12-18 DIAGNOSIS — Z20828 Contact with and (suspected) exposure to other viral communicable diseases: Secondary | ICD-10-CM | POA: Diagnosis not present

## 2020-01-30 ENCOUNTER — Other Ambulatory Visit: Payer: Self-pay | Admitting: Internal Medicine

## 2020-01-30 DIAGNOSIS — I10 Essential (primary) hypertension: Secondary | ICD-10-CM

## 2020-02-01 ENCOUNTER — Other Ambulatory Visit: Payer: Self-pay | Admitting: Internal Medicine

## 2020-02-01 DIAGNOSIS — I10 Essential (primary) hypertension: Secondary | ICD-10-CM

## 2020-02-04 ENCOUNTER — Other Ambulatory Visit: Payer: Self-pay | Admitting: Internal Medicine

## 2020-02-04 DIAGNOSIS — E7800 Pure hypercholesterolemia, unspecified: Secondary | ICD-10-CM

## 2020-02-12 ENCOUNTER — Encounter: Payer: PPO | Admitting: Internal Medicine

## 2020-03-23 ENCOUNTER — Other Ambulatory Visit: Payer: Self-pay | Admitting: Internal Medicine

## 2020-03-23 DIAGNOSIS — I10 Essential (primary) hypertension: Secondary | ICD-10-CM

## 2020-05-04 DIAGNOSIS — H40053 Ocular hypertension, bilateral: Secondary | ICD-10-CM | POA: Diagnosis not present

## 2020-05-12 ENCOUNTER — Encounter: Payer: Self-pay | Admitting: *Deleted

## 2020-05-16 ENCOUNTER — Encounter: Payer: PPO | Admitting: Internal Medicine

## 2020-06-15 ENCOUNTER — Other Ambulatory Visit: Payer: Self-pay | Admitting: Internal Medicine

## 2020-06-15 DIAGNOSIS — I10 Essential (primary) hypertension: Secondary | ICD-10-CM

## 2020-07-12 ENCOUNTER — Ambulatory Visit (INDEPENDENT_AMBULATORY_CARE_PROVIDER_SITE_OTHER): Payer: PPO | Admitting: Internal Medicine

## 2020-07-12 ENCOUNTER — Other Ambulatory Visit: Payer: Self-pay

## 2020-07-12 ENCOUNTER — Encounter: Payer: Self-pay | Admitting: Internal Medicine

## 2020-07-12 VITALS — BP 126/66 | HR 76 | Temp 97.0°F | Resp 16 | Ht 65.0 in | Wt 161.0 lb

## 2020-07-12 DIAGNOSIS — R0602 Shortness of breath: Secondary | ICD-10-CM | POA: Diagnosis not present

## 2020-07-12 DIAGNOSIS — I700 Atherosclerosis of aorta: Secondary | ICD-10-CM | POA: Diagnosis not present

## 2020-07-12 DIAGNOSIS — K219 Gastro-esophageal reflux disease without esophagitis: Secondary | ICD-10-CM

## 2020-07-12 DIAGNOSIS — I10 Essential (primary) hypertension: Secondary | ICD-10-CM | POA: Diagnosis not present

## 2020-07-12 DIAGNOSIS — E871 Hypo-osmolality and hyponatremia: Secondary | ICD-10-CM | POA: Diagnosis not present

## 2020-07-12 DIAGNOSIS — M549 Dorsalgia, unspecified: Secondary | ICD-10-CM

## 2020-07-12 DIAGNOSIS — R200 Anesthesia of skin: Secondary | ICD-10-CM

## 2020-07-12 DIAGNOSIS — R202 Paresthesia of skin: Secondary | ICD-10-CM | POA: Diagnosis not present

## 2020-07-12 DIAGNOSIS — Z0000 Encounter for general adult medical examination without abnormal findings: Secondary | ICD-10-CM | POA: Diagnosis not present

## 2020-07-12 DIAGNOSIS — E7800 Pure hypercholesterolemia, unspecified: Secondary | ICD-10-CM | POA: Diagnosis not present

## 2020-07-12 LAB — CBC WITH DIFFERENTIAL/PLATELET
Basophils Absolute: 0.1 10*3/uL (ref 0.0–0.1)
Basophils Relative: 1.7 % (ref 0.0–3.0)
Eosinophils Absolute: 0.2 10*3/uL (ref 0.0–0.7)
Eosinophils Relative: 4 % (ref 0.0–5.0)
HCT: 43.3 % (ref 36.0–46.0)
Hemoglobin: 14.6 g/dL (ref 12.0–15.0)
Lymphocytes Relative: 21.8 % (ref 12.0–46.0)
Lymphs Abs: 1.2 10*3/uL (ref 0.7–4.0)
MCHC: 33.8 g/dL (ref 30.0–36.0)
MCV: 92.5 fl (ref 78.0–100.0)
Monocytes Absolute: 0.5 10*3/uL (ref 0.1–1.0)
Monocytes Relative: 9.8 % (ref 3.0–12.0)
Neutro Abs: 3.5 10*3/uL (ref 1.4–7.7)
Neutrophils Relative %: 62.7 % (ref 43.0–77.0)
Platelets: 199 10*3/uL (ref 150.0–400.0)
RBC: 4.68 Mil/uL (ref 3.87–5.11)
RDW: 13.2 % (ref 11.5–15.5)
WBC: 5.5 10*3/uL (ref 4.0–10.5)

## 2020-07-12 LAB — BASIC METABOLIC PANEL
BUN: 13 mg/dL (ref 6–23)
CO2: 27 mEq/L (ref 19–32)
Calcium: 9.8 mg/dL (ref 8.4–10.5)
Chloride: 101 mEq/L (ref 96–112)
Creatinine, Ser: 0.64 mg/dL (ref 0.40–1.20)
GFR: 76.33 mL/min (ref >60.00)
Glucose, Bld: 91 mg/dL (ref 70–99)
Potassium: 4.1 mEq/L (ref 3.5–5.1)
Sodium: 135 mEq/L (ref 135–145)

## 2020-07-12 LAB — HEPATIC FUNCTION PANEL
ALT: 14 U/L (ref 0–35)
AST: 16 U/L (ref 0–37)
Albumin: 4 g/dL (ref 3.5–5.2)
Alkaline Phosphatase: 81 U/L (ref 39–117)
Bilirubin, Direct: 0.1 mg/dL (ref 0.0–0.3)
Total Bilirubin: 0.5 mg/dL (ref 0.2–1.2)
Total Protein: 6.2 g/dL (ref 6.0–8.3)

## 2020-07-12 LAB — LDL CHOLESTEROL, DIRECT: Direct LDL: 121 mg/dL

## 2020-07-12 LAB — VITAMIN B12: Vitamin B-12: 272 pg/mL (ref 211–911)

## 2020-07-12 LAB — LIPID PANEL
Cholesterol: 201 mg/dL — ABNORMAL HIGH (ref 0–200)
HDL: 56.2 mg/dL (ref >39.00)
NonHDL: 144.91
Total CHOL/HDL Ratio: 4
Triglycerides: 226 mg/dL — ABNORMAL HIGH (ref 0.0–149.0)
VLDL: 45.2 mg/dL — ABNORMAL HIGH (ref 0.0–40.0)

## 2020-07-12 NOTE — Progress Notes (Signed)
Patient ID: Janet Moran, female   DOB: [birth year], 87 y.o.   MRN: [id number]   Subjective:    Patient ID: Janet Moran, female    DOB: 1926-02-12, 87 y.o.   MRN: [id number]  HPI This visit occurred during the SARS-CoV-2 public health emergency.  Safety protocols were in place, including screening questions prior to the visit, additional usage of staff PPE, and extensive cleaning of exam room while observing appropriate contact time as indicated for disinfecting solutions.  Patient here for her physical exam.  She is accompanied by her daughter.  History obtained from both of them. Eating well.  Able to get around her house.  Discussed using a walker.  Walks down to get daily meal.  No chest pain reported.  Breathing stable.  Some pain in shoulders and back.  Stable.  Takes tylenol.  No abdominal pain.  Bowels moving.  Overall feels things are stable.    Past Medical History:  Diagnosis Date  . Arthritis   . Chicken pox   . GERD (gastroesophageal reflux disease)   . Glaucoma   . Hypercholesterolemia   . Hypertension    Past Surgical History:  Procedure Laterality Date  . ABDOMINAL HYSTERECTOMY  1976  . TONSILLECTOMY AND ADENOIDECTOMY  1935   Family History  Problem Relation Age of Onset  . Lung cancer Sister        died 74  . Stroke Mother   . Stroke Father   . Diabetes Maternal Grandmother   . Diabetes Maternal Grandfather   . Breast cancer Maternal Aunt        50's  . Breast cancer Daughter    Social History   Socioeconomic History  . Marital status: Widowed    Spouse name: Not on file  . Number of children: 5  . Years of education: Not on file  . Highest education level: Not on file  Occupational History  . Not on file  Tobacco Use  . Smoking status: Never Smoker  . Smokeless tobacco: Never Used  Substance and Sexual Activity  . Alcohol use: No    Alcohol/week: 0.0 standard drinks  . Drug use: No  . Sexual activity: Never  Other Topics Concern  . Not on  file  Social History Narrative  . Not on file   Social Determinants of Health   Financial Resource Strain: Not on file  Food Insecurity: Not on file  Transportation Needs: Not on file  Physical Activity: Not on file  Stress: Not on file  Social Connections: Not on file    Outpatient Encounter Medications as of 07/12/2020  Medication Sig  . amLODipine (NORVASC) 2.5 MG tablet TAKE 1 TABLET BY MOUTH DAILY  . aspirin EC 81 MG tablet Take 81 mg by mouth daily.  Marland Kitchen azelastine (ASTELIN) 0.1 % nasal spray Place 1 spray into both nostrils 2 (two) times daily. Use in each nostril as directed  . CALCIUM 500/D 500-400 MG-UNIT tablet SMARTSIG:By Mouth  . latanoprost (XALATAN) 0.005 % ophthalmic solution Place 1 drop into both eyes at bedtime.  Marland Kitchen losartan (COZAAR) 50 MG tablet TAKE ONE TABLET BY MOUTH EVERY DAY  . metoprolol succinate (TOPROL-XL) 25 MG 24 hr tablet TAKE 1 TABLET BY MOUTH DAILY (Patient not taking: Reported on 10/27/2019)  . Multiple Vitamins-Minerals (PRESERVISION AREDS 2) CAPS Take 1 capsule by mouth 2 (two) times daily.  . naproxen sodium (ANAPROX) 220 MG tablet Take 220 mg by mouth 2 (two) times daily with a meal.  .  omeprazole (PRILOSEC) 20 MG capsule Take 20 mg by mouth daily.  . simvastatin (ZOCOR) 20 MG tablet TAKE ONE TABLET BY MOUTH AT BEDTIME  . timolol (TIMOPTIC) 0.5 % ophthalmic solution Place 1 drop into the right eye daily.   Marland Kitchen ZYRTEC ALLERGY 10 MG tablet Take by mouth.   Facility-Administered Encounter Medications as of 07/12/2020  Medication  . cyanocobalamin ((VITAMIN B-12)) injection 1,000 mcg    Review of Systems  Constitutional: Negative for appetite change and unexpected weight change.  HENT: Negative for congestion, sinus pressure and sore throat.   Eyes: Negative for pain and visual disturbance.  Respiratory: Negative for cough and chest tightness.        Breathing stable.   Cardiovascular: Negative for chest pain, palpitations and leg swelling.   Gastrointestinal: Negative for abdominal pain, diarrhea, nausea and vomiting.  Genitourinary: Negative for difficulty urinating and dysuria.  Musculoskeletal: Negative for back pain and joint swelling.  Skin: Negative for color change and rash.  Neurological: Negative for dizziness, light-headedness and headaches.  Hematological: Negative for adenopathy. Does not bruise/bleed easily.  Psychiatric/Behavioral: Negative for agitation and dysphoric mood.       Objective:    Physical Exam Vitals reviewed.  Constitutional:      General: She is not in acute distress.    Appearance: Normal appearance. She is well-developed.  HENT:     Head: Normocephalic and atraumatic.     Right Ear: External ear normal.     Left Ear: External ear normal.  Eyes:     General: No scleral icterus.       Right eye: No discharge.        Left eye: No discharge.     Conjunctiva/sclera: Conjunctivae normal.  Neck:     Thyroid: No thyromegaly.  Cardiovascular:     Rate and Rhythm: Normal rate and regular rhythm.  Pulmonary:     Effort: No tachypnea, accessory muscle usage or respiratory distress.     Breath sounds: Normal breath sounds. No decreased breath sounds or wheezing.  Chest:  Breasts:     Right: No inverted nipple, mass, nipple discharge or tenderness (no axillary adenopathy).     Left: No inverted nipple, mass, nipple discharge or tenderness (no axilarry adenopathy).    Abdominal:     General: Bowel sounds are normal.     Palpations: Abdomen is soft.     Tenderness: There is no abdominal tenderness.  Musculoskeletal:        General: No swelling or tenderness.     Cervical back: Neck supple. No tenderness.  Lymphadenopathy:     Cervical: No cervical adenopathy.  Skin:    Findings: No erythema or rash.  Neurological:     Mental Status: She is alert and oriented to person, place, and time.  Psychiatric:        Mood and Affect: Mood normal.        Behavior: Behavior normal.     BP  126/66   Pulse 76   Temp (!) 97 F (36.1 C) (Temporal)   Resp 16   Ht 5\' 5"  (1.651 m)   Wt 161 lb (73 kg)   SpO2 98%   BMI 26.79 kg/m  Wt Readings from Last 3 Encounters:  07/12/20 161 lb (73 kg)  11/10/19 148 lb 3.2 oz (67.2 kg)  10/27/19 142 lb (64.4 kg)     Lab Results  Component Value Date   WBC 5.5 07/12/2020   HGB 14.6 07/12/2020   HCT  43.3 07/12/2020   PLT 199.0 07/12/2020   GLUCOSE 91 07/12/2020   CHOL 201 (H) 07/12/2020   TRIG 226.0 (H) 07/12/2020   HDL 56.20 07/12/2020   LDLDIRECT 121.0 07/12/2020   LDLCALC 94 06/02/2018   ALT 14 07/12/2020   AST 16 07/12/2020   NA 135 07/12/2020   K 4.1 07/12/2020   CL 101 07/12/2020   CREATININE 0.64 07/12/2020   BUN 13 07/12/2020   CO2 27 07/12/2020   TSH 2.52 11/10/2019       Assessment & Plan:   Problem List Items Addressed This Visit    Aortic atherosclerosis (HCC)    Continues simvastatin.       Back pain - Primary    Overall stable.  Use walker for added support.  Desires no further intervention.       GERD (gastroesophageal reflux disease)    No upper symptoms.  Controlled on prilosec.       Health care maintenance    Physical today 07/12/20.  Declines mammogram.       Hypercholesterolemia    Continue simvastatin.  Follow lipid panel and liver function tests.        Relevant Orders   CBC with Differential/Platelet (Completed)   Hepatic function panel (Completed)   Lipid panel (Completed)   Hypertension    On losartan and amlodipine.  Blood pressure doing well.  Follow pressures.  Follow metabolic panel.       Relevant Orders   Basic metabolic panel (Completed)   Hyponatremia    Recheck sodium today.       Numbness and tingling of both feet    Reports numbness and tingling in her feet.  Check B12 level.  Walker.  Follow.       Relevant Orders   Vitamin B12 (Completed)   SOB (shortness of breath)    Feels her breathing is stable.  Had previously discussed cardiac reevaluation.  She  previously declined and continues to decline.  Breathing stable.  Will notify me if change.           Dale Lawton, MD

## 2020-07-12 NOTE — Assessment & Plan Note (Addendum)
Physical today 07/12/20.  Declines mammogram.

## 2020-07-15 ENCOUNTER — Telehealth: Payer: Self-pay

## 2020-07-15 NOTE — Telephone Encounter (Signed)
Left detailed message for Janet Moran about order for B12 injections

## 2020-07-15 NOTE — Telephone Encounter (Signed)
Denise with [employer] at [place] returned your call. She said that she leaves at 3 so please leave a detailed message on her vm.

## 2020-07-16 ENCOUNTER — Encounter: Payer: Self-pay | Admitting: Internal Medicine

## 2020-07-17 ENCOUNTER — Encounter: Payer: Self-pay | Admitting: Internal Medicine

## 2020-07-17 NOTE — Assessment & Plan Note (Signed)
Overall stable.  Use walker for added support.  Desires no further intervention.

## 2020-07-17 NOTE — Assessment & Plan Note (Signed)
Continues simvastatin.   

## 2020-07-17 NOTE — Assessment & Plan Note (Signed)
Reports numbness and tingling in her feet.  Check B12 level.  Walker.  Follow.

## 2020-07-17 NOTE — Assessment & Plan Note (Signed)
Feels her breathing is stable.  Had previously discussed cardiac reevaluation.  She previously declined and continues to decline.  Breathing stable.  Will notify me if change.

## 2020-07-17 NOTE — Assessment & Plan Note (Signed)
On losartan and amlodipine.  Blood pressure doing well.  Follow pressures.  Follow metabolic panel.  

## 2020-07-17 NOTE — Assessment & Plan Note (Signed)
No upper symptoms.  Controlled on prilosec.  

## 2020-07-17 NOTE — Assessment & Plan Note (Signed)
Continue simvastatin.  Follow lipid panel and liver function tests.   

## 2020-07-17 NOTE — Assessment & Plan Note (Signed)
Recheck sodium today.  

## 2020-07-18 MED ORDER — CYANOCOBALAMIN 1000 MCG/ML IJ SOLN: INTRAMUSCULAR | 0 refills | Status: DC

## 2020-07-18 MED ORDER — "BD ECLIPSE SYRINGE 25G X 1"" 3 ML MISC": 0 refills | Status: DC

## 2020-07-18 NOTE — Telephone Encounter (Signed)
B12 injections ordered, rx sent to [facility] and patient has been notified

## 2020-07-18 NOTE — Addendum Note (Signed)
Addended by: Larry Sierras on: 07/18/2020 01:49 PM   Modules accepted: Orders

## 2020-08-04 ENCOUNTER — Other Ambulatory Visit: Payer: Self-pay | Admitting: Internal Medicine

## 2020-08-04 DIAGNOSIS — E7800 Pure hypercholesterolemia, unspecified: Secondary | ICD-10-CM

## 2020-08-31 ENCOUNTER — Telehealth: Payer: Self-pay | Admitting: Internal Medicine

## 2020-08-31 NOTE — Telephone Encounter (Signed)
Patient's son called and stated that patient has tested positive for covid. [facility] is recommending the patient get the antivirus medication. Gabriel Rung, her sons  phone number [phone]. Patient's son spoke to her over the phone, he said, she is not eating much, sleeps a lot, sounds like she has a cold, light headed, not sure if she is running a fever. Patient had 2nd covid booster injection last week.

## 2020-09-01 NOTE — Telephone Encounter (Signed)
error 

## 2020-09-01 NOTE — Telephone Encounter (Signed)
Called son to get update. Patient is not awake yet. She sleeps late. Advised that given her symptoms noted yesterday and request for antiviral medication she will need to go to an urgent care to be evaluated in person. Son was agreeable and thanked me for the call back.

## 2020-09-01 NOTE — Telephone Encounter (Signed)
Spoke with daughter in Social worker. Advised urgent care or ED again depending if symptoms have worsened. May need fluids. Daughter in law just finished radiation. Her nor her husband have been around her since testing positive. The nurse at [facility] has been checking on patient per daughter in law. Called and left message for nurse at the [facility] to call me back to get more information. Patient needs to be seen.

## 2020-09-02 NOTE — Telephone Encounter (Signed)
Spoke with Janet Moran at [facility]. 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 Could not give me any info outside of the quarantine policy and that if antiviral is prescribed it would be up to PCP because they are not responsible. 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 She is off until Tuesday. Was given her cell phone number. Left message for her to return my call. Number is [phone]. Called and spoke with son to see if he has talked to his mother. He says that the update he got today seemed like she was much better. Was up, eating, drinking, working on a cross word puzzle. He did not think the antiviral medication is necessary. No visit is needed. They will let us know if anything further is needed.

## 2020-09-02 NOTE — Telephone Encounter (Signed)
Called patient for update. Tested positive for COVID on 08/30/20. Was advised to go to urgent care yesterday but did not go. Denies any congestion, SOB, cough, fever etc. She states she is eating and drinking like she normally does. Was a little swimmy headed a couple of days ago but that has resolved. She has a nurse named Tabitha checking on her daily. The only complaint that she had was that she is more tired than she normally is but other wise she feels okay.

## 2020-10-27 ENCOUNTER — Ambulatory Visit (INDEPENDENT_AMBULATORY_CARE_PROVIDER_SITE_OTHER): Payer: PPO

## 2020-10-27 VITALS — Ht 65.0 in | Wt 161.0 lb

## 2020-10-27 DIAGNOSIS — Z0000 Encounter for general adult medical examination without abnormal findings: Secondary | ICD-10-CM

## 2020-10-27 NOTE — Progress Notes (Signed)
Subjective:   Janet Moran is a 87 y.o. female who presents for Medicare Annual (Subsequent) preventive examination.  Review of Systems    No ROS.  Medicare Wellness Virtual Visit.  Visual/audio telehealth visit, UTA vital signs.   See social history for additional risk factors.   Cardiac Risk Factors include: advanced age (>6men, >92 women);hypertension     Objective:    Today's Vitals   10/27/20 1118  Weight: 161 lb (73 kg)  Height: 5\' 5"  (1.651 m)   Body mass index is 26.79 kg/m.  Advanced Directives 10/27/2020 10/27/2019 10/24/2018 06/03/2018 10/19/2016 01/30/2015 08/17/2014  Does Patient Have a Medical Advance Directive? Yes Yes Yes Yes Yes Yes Yes  Type of 10/17/2014 of [place];Living will Healthcare Power of [place];Living will Out of facility DNR (pink MOST or yellow form) Out of facility DNR (pink MOST or yellow form) Healthcare Power of [place];Living will - Healthcare Power of [place];Living will  Does patient want to make changes to medical advance directive? No - Patient declined No - Patient declined - - No - Patient declined - -  Copy of Healthcare Power of Attorney in Chart? Yes - validated most recent copy scanned in chart (See row information) Yes - validated most recent copy scanned in chart (See row information) - - - - -    Current Medications (verified) Outpatient Encounter Medications as of 10/27/2020  Medication Sig   amLODipine (NORVASC) 2.5 MG tablet TAKE 1 TABLET BY MOUTH DAILY   aspirin EC 81 MG tablet Take 81 mg by mouth daily.   azelastine (ASTELIN) 0.1 % nasal spray Place 1 spray into both nostrils 2 (two) times daily. Use in each nostril as directed   CALCIUM 500/D 500-400 MG-UNIT tablet SMARTSIG:By Mouth   cyanocobalamin (,VITAMIN B-12,) 1000 MCG/ML injection Inject 27mL (1,000 mcg) into the skin once a week for 4 weeks and then once every thirty days.   latanoprost (XALATAN) 0.005 % ophthalmic solution Place 1 drop into  both eyes at bedtime.   losartan (COZAAR) 50 MG tablet TAKE ONE TABLET BY MOUTH EVERY DAY   metoprolol succinate (TOPROL-XL) 25 MG 24 hr tablet TAKE 1 TABLET BY MOUTH DAILY (Patient not taking: Reported on 10/27/2019)   Multiple Vitamins-Minerals (PRESERVISION AREDS 2) CAPS Take 1 capsule by mouth 2 (two) times daily.   naproxen sodium (ANAPROX) 220 MG tablet Take 220 mg by mouth 2 (two) times daily with a meal.   omeprazole (PRILOSEC) 20 MG capsule Take 20 mg by mouth daily.   simvastatin (ZOCOR) 20 MG tablet TAKE 1 TABLET BY MOUTH AT BEDTIME   SYRINGE-NEEDLE, DISP, 3 ML (BD ECLIPSE SYRINGE) 25G X 1" 3 ML MISC Use as directed with b12 injections.   timolol (TIMOPTIC) 0.5 % ophthalmic solution Place 1 drop into the right eye daily.    ZYRTEC ALLERGY 10 MG tablet Take by mouth.   Facility-Administered Encounter Medications as of 10/27/2020  Medication   cyanocobalamin ((VITAMIN B-12)) injection 1,000 mcg    Allergies (verified) Avelox [moxifloxacin], Crestor [rosuvastatin], Pravachol [pravastatin], and Vytorin [ezetimibe-simvastatin]   History: Past Medical History:  Diagnosis Date   Arthritis    Chicken pox    GERD (gastroesophageal reflux disease)    Glaucoma    Hypercholesterolemia    Hypertension    Past Surgical History:  Procedure Laterality Date   ABDOMINAL HYSTERECTOMY  1976   TONSILLECTOMY AND ADENOIDECTOMY  1935   Family History  Problem Relation Age of Onset   Lung cancer  Sister        died 5   Stroke Mother    Stroke Father    Diabetes Maternal Grandmother    Diabetes Maternal Grandfather    Breast cancer Maternal Aunt        52's   Breast cancer Daughter    Social History   Socioeconomic History   Marital status: Widowed    Spouse name: Not on file   Number of children: 5   Years of education: Not on file   Highest education level: Not on file  Occupational History   Not on file  Tobacco Use   Smoking status: Never   Smokeless tobacco: Never   Substance and Sexual Activity   Alcohol use: No    Alcohol/week: 0.0 standard drinks   Drug use: No   Sexual activity: Never  Other Topics Concern   Not on file  Social History Narrative   Not on file   Social Determinants of Health   Financial Resource Strain: Low Risk    Difficulty of Paying Living Expenses: Not hard at all  Food Insecurity: No Food Insecurity   Worried About Programme researcher, broadcasting/film/video in the Last Year: Never true   Ran Out of Food in the Last Year: Never true  Transportation Needs: No Transportation Needs   Lack of Transportation (Medical): No   Lack of Transportation (Non-Medical): No  Physical Activity: Not on file  Stress: No Stress Concern Present   Feeling of Stress : Not at all  Social Connections: Unknown   Frequency of Communication with Friends and Family: More than three times a week   Frequency of Social Gatherings with Friends and Family: Not on file   Attends Religious Services: Not on file   Active Member of Clubs or Organizations: Not on file   Attends Banker Meetings: Not on file   Marital Status: Widowed    Tobacco Counseling Counseling given: Not Answered   Clinical Intake:  Pre-visit preparation completed: Yes        Diabetes: No  How often do you need to have someone help you when you read instructions, pamphlets, or other written materials from your doctor or pharmacy?: 1 - Never    Interpreter Needed?: No      Activities of Daily Living In your present state of health, do you have any difficulty performing the following activities: 10/27/2020  Hearing? N  Vision? N  Difficulty concentrating or making decisions? Y  Comment SOBOE. Paces self.  Walking or climbing stairs? N  Dressing or bathing? N  Doing errands, shopping? Y  Preparing Food and eating ? Y  Comment Food prepared by staff. Self feeds.  Using the Toilet? N  In the past six months, have you accidently leaked urine? N  Do you have problems  with loss of bowel control? N  Managing your Medications? N  Managing your Finances? N  Housekeeping or managing your Housekeeping? Y  Some recent data might be hidden    Patient Care Team: Dale Stonington, MD as PCP - General (Internal Medicine)  Indicate any recent Medical Services you may have received from other than [facility] providers in the past year (date may be approximate).     Assessment:   This is a routine wellness examination for Janet Moran.  I connected with Janet Moran today by telephone and verified that I am speaking with the correct person using two identifiers. Location patient: home Location provider: work Persons participating in the virtual visit:  patient, nurse.    I discussed the limitations, risks, security and privacy concerns of performing an evaluation and management service by telephone and the availability of in person appointments. The patient expressed understanding and verbally consented to this telephonic visit.    Interactive audio and video telecommunications were attempted between this provider and patient, however failed, due to patient having technical difficulties OR patient did not have access to video capability.  We continued and completed visit with audio only.  Some vital signs may be absent or patient reported.   Hearing/Vision screen Hearing Screening - Comments:: Patient is able to hear conversational tones without difficulty.  No issues reported.  Vision Screening - Comments:: Wears corrective lenses They have seen their ophthalmologist in the last 12 months.    Dietary issues and exercise activities discussed: Current Exercise Habits: Home exercise routine, Intensity: Mild Regular diet Good water intake   Goals Addressed               This Visit's Progress     Patient Stated     Follow up with primary care provider (pt-stated)        As needed and keep routine visits        Depression Screen PHQ 2/9 Scores 10/27/2020 10/27/2019  10/24/2018 04/18/2017 10/19/2016 09/12/2016 04/17/2016  PHQ - 2 Score 0 0 0 0 0 0 0    Fall Risk Fall Risk  10/27/2020 10/27/2019 11/06/2018 10/24/2018 04/18/2017  Falls in the past year? 0 0 0 0 No  Comment - - Emmi Telephone Survey: data to providers prior to load - -  Number falls in past yr: 0 0 - - -  Injury with Fall? 0 - - - -  Risk for fall due to : - - - - -  Follow up Falls evaluation completed Falls evaluation completed - - -    FALL RISK PREVENTION PERTAINING TO THE HOME: Adequate lighting in your home to reduce risk of falls? Yes   ASSISTIVE DEVICES UTILIZED TO PREVENT FALLS:  Life alert? Yes  Use of a cane, walker or w/c? Yes , walker in use as needed. Grab bars in the bathroom? Yes   TIMED UP AND GO: Was the test performed? No .   Cognitive Function: Patient is alert and oriented x3.  Denies difficulty focusing, making decisions, memory loss. Enjoys brain health games, manages her own medications and finance.  MMSE/6CIT deferred. Normal by communication/observation.  MMSE - Mini Mental State Exam 10/19/2016  Orientation to time 5  Orientation to Place 5  Registration 3  Attention/ Calculation 5  Recall 3  Language- name 2 objects 2  Language- repeat 1  Language- follow 3 step command 3  Language- read & follow direction 1  Write a sentence 1  Copy design 1  Total score 30     6CIT Screen 10/27/2019 10/24/2018  What Year? 0 points 0 points  What month? 0 points 0 points  What time? 0 points 0 points  Count back from 20 0 points 0 points  Months in reverse 0 points 0 points  Repeat phrase 0 points 0 points  Total Score 0 0    Immunizations Immunization History  Administered Date(s) Administered   Influenza Split 01/14/2012, 12/15/2013   Influenza, High Dose Seasonal PF 01/04/2016, 12/07/2016, 02/14/2018   Influenza,inj,Quad PF,6+ Mos 02/24/2013, 03/15/2015   Influenza-Unspecified 01/08/2020   PFIZER(Purple Top)SARS-COV-2 Vaccination 04/15/2019, 05/13/2019,  01/09/2020, 08/24/2020   Pneumococcal Conjugate-13 01/10/2017   Pneumococcal Polysaccharide-23 02/14/2018  TDAP status: Due, Education has been provided regarding the importance of this vaccine. Advised may receive this vaccine at local pharmacy or Health Dept. Aware to provide a copy of the vaccination record if obtained from local pharmacy or Health Dept. Verbalized acceptance and understanding. Deferred.   Health Maintenance Health Maintenance  Topic Date Due   Zoster Vaccines- Shingrix (1 of 2) 01/27/2021 (Originally 05/22/1975)   TETANUS/TDAP  10/27/2021 (Originally 05/22/1944)   INFLUENZA VACCINE  11/07/2020   DEXA SCAN  Completed   COVID-19 Vaccine  Completed   PNA vac Low Risk Adult  Completed   HPV VACCINES  Aged Out   MAMMOGRAM  Discontinued   Colorectal cancer screening: No longer required.   Mammogram status: No longer required due to aged out.  Lung Cancer Screening: (Low Dose CT Chest recommended if Age 81-80 years, 30 pack-year currently smoking OR have quit w/in 15years.) does not qualify.   Hepatitis C Screening: does not qualify  Vision Screening: Recommended annual ophthalmology exams for early detection of glaucoma and other disorders of the eye.  Dental Screening: Recommended annual dental exams for proper oral hygiene  Community Resource Referral / Chronic Care Management: CRR required this visit?  No   CCM required this visit?  No      Plan:   Keep all routine maintenance appointments.   I have personally reviewed and noted the following in the patient's chart:   Medical and social history Use of alcohol, tobacco or illicit drugs  Current medications and supplements including opioid prescriptions. Patient is not currently taking opioid.  Functional ability and status Nutritional status Physical activity Advanced directives List of other physicians Hospitalizations, surgeries, and ER visits in previous 12 months Vitals Screenings to include  cognitive, depression, and falls Referrals and appointments  In addition, I have reviewed and discussed with patient certain preventive protocols, quality metrics, and best practice recommendations. A written personalized care plan for preventive services as well as general preventive health recommendations were provided to patient via mychart.     Ashok Pall, LPN   2/70/3500

## 2020-10-27 NOTE — Patient Instructions (Addendum)
[name] , Thank you for taking time to come for your Medicare Wellness Visit. I appreciate your ongoing commitment to your health goals. Please review the following plan we discussed and let me know if I can assist you in the future.   These are the goals we discussed:  Goals       Patient Stated     Follow up with primary care provider (pt-stated)      As needed and keep routine visits         This is a list of the screening recommended for you and due dates:  Health Maintenance  Topic Date Due   Zoster (Shingles) Vaccine (1 of 2) 01/27/2021*   Tetanus Vaccine  10/27/2021*   Flu Shot  11/07/2020   DEXA scan (bone density measurement)  Completed   COVID-19 Vaccine  Completed   Pneumonia vaccines  Completed   HPV Vaccine  Aged Out   Mammogram  Discontinued  *Topic was postponed. The date shown is not the original due date.    Advanced directive: on file  Conditions/risks identified: none new  Follow up in one year for your annual wellness visit    Preventive Care 65 Years and Older, Female Preventive care refers to lifestyle choices and visits with your health care provider that can promote health and wellness. What does preventive care include? A yearly physical exam. This is also called an annual well check. Dental exams once or twice a year. Routine eye exams. Ask your health care provider how often you should have your eyes checked. Personal lifestyle choices, including: Daily care of your teeth and gums. Regular physical activity. Eating a healthy diet. Avoiding tobacco and drug use. Limiting alcohol use. Practicing safe sex. Taking low-dose aspirin every day. Taking vitamin and mineral supplements as recommended by your health care provider. What happens during an annual well check? The services and screenings done by your health care provider during your annual well check will depend on your age, overall health, lifestyle risk factors, and family history  of disease. Counseling  Your health care provider may ask you questions about your: Alcohol use. Tobacco use. Drug use. Emotional well-being. Home and relationship well-being. Sexual activity. Eating habits. History of falls. Memory and ability to understand (cognition). Work and work Astronomer. Reproductive health. Screening  You may have the following tests or measurements: Height, weight, and BMI. Blood pressure. Lipid and cholesterol levels. These may be checked every 5 years, or more frequently if you are over 19 years old. Skin check. Lung cancer screening. You may have this screening every year starting at age 58 if you have a 30-pack-year history of smoking and currently smoke or have quit within the past 15 years. Fecal occult blood test (FOBT) of the stool. You may have this test every year starting at age 25. Flexible sigmoidoscopy or colonoscopy. You may have a sigmoidoscopy every 5 years or a colonoscopy every 10 years starting at age 102. Hepatitis C blood test. Hepatitis B blood test. Sexually transmitted disease (STD) testing. Diabetes screening. This is done by checking your blood sugar (glucose) after you have not eaten for a while (fasting). You may have this done every 1-3 years. Bone density scan. This is done to screen for osteoporosis. You may have this done starting at age 19. Mammogram. This may be done every 1-2 years. Talk to your health care provider about how often you should have regular mammograms. Talk with your health care provider about your  test results, treatment options, and if necessary, the need for more tests. Vaccines  Your health care provider may recommend certain vaccines, such as: Influenza vaccine. This is recommended every year. Tetanus, diphtheria, and acellular pertussis (Tdap, Td) vaccine. You may need a Td booster every 10 years. Zoster vaccine. You may need this after age 64. Pneumococcal 13-valent conjugate (PCV13) vaccine. One  dose is recommended after age 74. Pneumococcal polysaccharide (PPSV23) vaccine. One dose is recommended after age 77. Talk to your health care provider about which screenings and vaccines you need and how often you need them. This information is not intended to replace advice given to you by your health care provider. Make sure you discuss any questions you have with your health care provider. Document Released: 04/22/2015 Document Revised: 12/14/2015 Document Reviewed: 01/25/2015 Elsevier Interactive Patient Education  2017 [employer] Prevention in the Home Falls can cause injuries. They can happen to people of all ages. There are many things you can do to make your home safe and to help prevent falls. What can I do on the outside of my home? Regularly fix the edges of walkways and driveways and fix any cracks. Remove anything that might make you trip as you walk through a door, such as a raised step or threshold. Trim any bushes or trees on the path to your home. Use bright outdoor lighting. Clear any walking paths of anything that might make someone trip, such as rocks or tools. Regularly check to see if handrails are loose or broken. Make sure that both sides of any steps have handrails. Any raised decks and porches should have guardrails on the edges. Have any leaves, snow, or ice cleared regularly. Use sand or salt on walking paths during winter. Clean up any spills in your garage right away. This includes oil or grease spills. What can I do in the bathroom? Use night lights. Install grab bars by the toilet and in the tub and shower. Do not use towel bars as grab bars. Use non-skid mats or decals in the tub or shower. If you need to sit down in the shower, use a plastic, non-slip stool. Keep the floor dry. Clean up any water that spills on the floor as soon as it happens. Remove soap buildup in the tub or shower regularly. Attach bath mats securely with double-sided  non-slip rug tape. Do not have throw rugs and other things on the floor that can make you trip. What can I do in the bedroom? Use night lights. Make sure that you have a light by your bed that is easy to reach. Do not use any sheets or blankets that are too big for your bed. They should not hang down onto the floor. Have a firm chair that has side arms. You can use this for support while you get dressed. Do not have throw rugs and other things on the floor that can make you trip. What can I do in the kitchen? Clean up any spills right away. Avoid walking on wet floors. Keep items that you use a lot in easy-to-reach places. If you need to reach something above you, use a strong step stool that has a grab bar. Keep electrical cords out of the way. Do not use floor polish or wax that makes floors slippery. If you must use wax, use non-skid floor wax. Do not have throw rugs and other things on the floor that can make you trip. What can I do with my  stairs? Do not leave any items on the stairs. Make sure that there are handrails on both sides of the stairs and use them. Fix handrails that are broken or loose. Make sure that handrails are as long as the stairways. Check any carpeting to make sure that it is firmly attached to the stairs. Fix any carpet that is loose or worn. Avoid having throw rugs at the top or bottom of the stairs. If you do have throw rugs, attach them to the floor with carpet tape. Make sure that you have a light switch at the top of the stairs and the bottom of the stairs. If you do not have them, ask someone to add them for you. What else can I do to help prevent falls? Wear shoes that: Do not have high heels. Have rubber bottoms. Are comfortable and fit you well. Are closed at the toe. Do not wear sandals. If you use a stepladder: Make sure that it is fully opened. Do not climb a closed stepladder. Make sure that both sides of the stepladder are locked into place. Ask  someone to hold it for you, if possible. Clearly mark and make sure that you can see: Any grab bars or handrails. First and last steps. Where the edge of each step is. Use tools that help you move around (mobility aids) if they are needed. These include: Canes. Walkers. Scooters. Crutches. Turn on the lights when you go into a dark area. Replace any light bulbs as soon as they burn out. Set up your furniture so you have a clear path. Avoid moving your furniture around. If any of your floors are uneven, fix them. If there are any pets around you, be aware of where they are. Review your medicines with your doctor. Some medicines can make you feel dizzy. This can increase your chance of falling. Ask your doctor what other things that you can do to help prevent falls. This information is not intended to replace advice given to you by your health care provider. Make sure you discuss any questions you have with your health care provider. Document Released: 01/20/2009 Document Revised: 09/01/2015 Document Reviewed: 04/30/2014 Elsevier Interactive Patient Education  2017 [employer].

## 2020-10-31 DIAGNOSIS — H353131 Nonexudative age-related macular degeneration, bilateral, early dry stage: Secondary | ICD-10-CM | POA: Diagnosis not present

## 2020-11-08 ENCOUNTER — Other Ambulatory Visit: Payer: Self-pay | Admitting: Internal Medicine

## 2020-11-08 DIAGNOSIS — I10 Essential (primary) hypertension: Secondary | ICD-10-CM

## 2020-12-06 ENCOUNTER — Other Ambulatory Visit: Payer: Self-pay | Admitting: Internal Medicine

## 2020-12-06 DIAGNOSIS — I10 Essential (primary) hypertension: Secondary | ICD-10-CM

## 2021-01-12 ENCOUNTER — Ambulatory Visit: Payer: PPO | Admitting: Internal Medicine

## 2021-01-16 ENCOUNTER — Other Ambulatory Visit: Payer: Self-pay

## 2021-01-16 ENCOUNTER — Ambulatory Visit (INDEPENDENT_AMBULATORY_CARE_PROVIDER_SITE_OTHER): Payer: PPO | Admitting: Internal Medicine

## 2021-01-16 VITALS — BP 128/72 | HR 72 | Temp 97.9°F | Resp 16 | Ht 65.0 in | Wt 163.0 lb

## 2021-01-16 DIAGNOSIS — M549 Dorsalgia, unspecified: Secondary | ICD-10-CM

## 2021-01-16 DIAGNOSIS — M810 Age-related osteoporosis without current pathological fracture: Secondary | ICD-10-CM

## 2021-01-16 DIAGNOSIS — I10 Essential (primary) hypertension: Secondary | ICD-10-CM

## 2021-01-16 DIAGNOSIS — K219 Gastro-esophageal reflux disease without esophagitis: Secondary | ICD-10-CM | POA: Diagnosis not present

## 2021-01-16 DIAGNOSIS — E538 Deficiency of other specified B group vitamins: Secondary | ICD-10-CM | POA: Diagnosis not present

## 2021-01-16 DIAGNOSIS — R200 Anesthesia of skin: Secondary | ICD-10-CM

## 2021-01-16 DIAGNOSIS — R202 Paresthesia of skin: Secondary | ICD-10-CM | POA: Diagnosis not present

## 2021-01-16 DIAGNOSIS — I700 Atherosclerosis of aorta: Secondary | ICD-10-CM

## 2021-01-16 DIAGNOSIS — E871 Hypo-osmolality and hyponatremia: Secondary | ICD-10-CM | POA: Diagnosis not present

## 2021-01-16 DIAGNOSIS — E7800 Pure hypercholesterolemia, unspecified: Secondary | ICD-10-CM | POA: Diagnosis not present

## 2021-01-16 LAB — HEPATIC FUNCTION PANEL
ALT: 14 U/L (ref 0–35)
AST: 15 U/L (ref 0–37)
Albumin: 3.9 g/dL (ref 3.5–5.2)
Alkaline Phosphatase: 65 U/L (ref 39–117)
Bilirubin, Direct: 0.1 mg/dL (ref 0.0–0.3)
Total Bilirubin: 0.6 mg/dL (ref 0.2–1.2)
Total Protein: 6.1 g/dL (ref 6.0–8.3)

## 2021-01-16 LAB — BASIC METABOLIC PANEL
BUN: 9 mg/dL (ref 6–23)
CO2: 26 mEq/L (ref 19–32)
Calcium: 9.6 mg/dL (ref 8.4–10.5)
Chloride: 102 mEq/L (ref 96–112)
Creatinine, Ser: 0.55 mg/dL (ref 0.40–1.20)
GFR: 78.88 mL/min (ref >60.00)
Glucose, Bld: 82 mg/dL (ref 70–99)
Potassium: 4.3 mEq/L (ref 3.5–5.1)
Sodium: 135 mEq/L (ref 135–145)

## 2021-01-16 LAB — LIPID PANEL
Cholesterol: 171 mg/dL (ref 0–200)
HDL: 55.6 mg/dL (ref >39.00)
NonHDL: 115.13
Total CHOL/HDL Ratio: 3
Triglycerides: 219 mg/dL — ABNORMAL HIGH (ref 0.0–149.0)
VLDL: 43.8 mg/dL — ABNORMAL HIGH (ref 0.0–40.0)

## 2021-01-16 LAB — VITAMIN B12: Vitamin B-12: 604 pg/mL (ref 211–911)

## 2021-01-16 LAB — TSH: TSH: 1.98 u[IU]/mL (ref 0.35–5.50)

## 2021-01-16 LAB — VITAMIN D 25 HYDROXY (VIT D DEFICIENCY, FRACTURES): VITD: 20.59 ng/mL — ABNORMAL LOW (ref 30.00–100.00)

## 2021-01-16 LAB — LDL CHOLESTEROL, DIRECT: Direct LDL: 95 mg/dL

## 2021-01-16 NOTE — Patient Instructions (Signed)
Alpha lipoic acid 600mg  - one per day.

## 2021-01-16 NOTE — Progress Notes (Signed)
Patient ID: Janet Moran, female   DOB: 1926-03-29, 87 y.o.   MRN: [id number]   Subjective:    Patient ID: Janet Moran, female    DOB: 1925/07/26, 87 y.o.   MRN: [id number]  This visit occurred during the SARS-CoV-2 public health emergency.  Safety protocols were in place, including screening questions prior to the visit, additional usage of staff PPE, and extensive cleaning of exam room while observing appropriate contact time as indicated for disinfecting solutions.   Patient here for a scheduled follow up.   HPI She is accompanied by her daughter.  History obtained from both of them.  Here to follow up regarding her blood pressure and cholesterol.  Had covid 08/2020.  No residual problems.  Breathing stable.  No increased cough or congestion.  No chest pain.  No acid reflux reported.  No abdominal pain.  Drinking prune juice to keep bowels regular.  Does report back pain.  Has been a chronic issue.  Taking tylenol and otc antiinflammatory.  Discussed scheduled dosing.     Past Medical History:  Diagnosis Date   Arthritis    Chicken pox    GERD (gastroesophageal reflux disease)    Glaucoma    Hypercholesterolemia    Hypertension    Past Surgical History:  Procedure Laterality Date   ABDOMINAL HYSTERECTOMY  1976   TONSILLECTOMY AND ADENOIDECTOMY  1935   Family History  Problem Relation Age of Onset   Lung cancer Sister        died 40   Stroke Mother    Stroke Father    Diabetes Maternal Grandmother    Diabetes Maternal Grandfather    Breast cancer Maternal Aunt        37's   Breast cancer Daughter    Social History   Socioeconomic History   Marital status: Widowed    Spouse name: Not on file   Number of children: 5   Years of education: Not on file   Highest education level: Not on file  Occupational History   Not on file  Tobacco Use   Smoking status: Never   Smokeless tobacco: Never  Substance and Sexual Activity   Alcohol use: No    Alcohol/week: 0.0  standard drinks   Drug use: No   Sexual activity: Never  Other Topics Concern   Not on file  Social History Narrative   Not on file   Social Determinants of Health   Financial Resource Strain: Low Risk    Difficulty of Paying Living Expenses: Not hard at all  Food Insecurity: No Food Insecurity   Worried About Programme researcher, broadcasting/film/video in the Last Year: Never true   Ran Out of Food in the Last Year: Never true  Transportation Needs: No Transportation Needs   Lack of Transportation (Medical): No   Lack of Transportation (Non-Medical): No  Physical Activity: Not on file  Stress: No Stress Concern Present   Feeling of Stress : Not at all  Social Connections: Unknown   Frequency of Communication with Friends and Family: More than three times a week   Frequency of Social Gatherings with Friends and Family: Not on file   Attends Religious Services: Not on file   Active Member of Clubs or Organizations: Not on file   Attends Banker Meetings: Not on file   Marital Status: Widowed    Review of Systems  Constitutional:  Negative for appetite change and unexpected weight change.  HENT:  Negative for  congestion and sinus pressure.   Respiratory:  Negative for cough and chest tightness.        Breathing stable.   Cardiovascular:  Negative for chest pain, palpitations and leg swelling.  Gastrointestinal:  Negative for abdominal pain, diarrhea, nausea and vomiting.  Genitourinary:  Negative for difficulty urinating and dysuria.  Musculoskeletal:  Positive for back pain. Negative for joint swelling and myalgias.  Skin:  Negative for color change and rash.  Neurological:  Negative for dizziness, light-headedness and headaches.  Psychiatric/Behavioral:  Negative for agitation and dysphoric mood.       Objective:     BP 128/72   Pulse 72   Temp 97.9 F (36.6 C)   Resp 16   Ht 5\' 5"  (1.651 m)   Wt 163 lb (73.9 kg)   SpO2 98%   BMI 27.12 kg/m  Wt Readings from Last 3  Encounters:  01/16/21 163 lb (73.9 kg)  10/27/20 161 lb (73 kg)  07/12/20 161 lb (73 kg)    Physical Exam Vitals reviewed.  Constitutional:      General: She is not in acute distress.    Appearance: Normal appearance.  HENT:     Head: Normocephalic and atraumatic.     Right Ear: External ear normal.     Left Ear: External ear normal.  Eyes:     General: No scleral icterus.       Right eye: No discharge.        Left eye: No discharge.     Conjunctiva/sclera: Conjunctivae normal.  Neck:     Thyroid: No thyromegaly.  Cardiovascular:     Rate and Rhythm: Normal rate and regular rhythm.  Pulmonary:     Effort: No respiratory distress.     Breath sounds: Normal breath sounds. No wheezing.  Abdominal:     General: Bowel sounds are normal.     Palpations: Abdomen is soft.     Tenderness: There is no abdominal tenderness.  Musculoskeletal:        General: No swelling or tenderness.     Cervical back: Neck supple. No tenderness.  Lymphadenopathy:     Cervical: No cervical adenopathy.  Skin:    Findings: No erythema or rash.  Neurological:     Mental Status: She is alert.  Psychiatric:        Mood and Affect: Mood normal.        Behavior: Behavior normal.     Outpatient Encounter Medications as of 01/16/2021  Medication Sig   amLODipine (NORVASC) 2.5 MG tablet TAKE 1 TABLET BY MOUTH DAILY   aspirin EC 81 MG tablet Take 81 mg by mouth daily.   azelastine (ASTELIN) 0.1 % nasal spray Place 1 spray into both nostrils 2 (two) times daily. Use in each nostril as directed   CALCIUM 500/D 500-400 MG-UNIT tablet SMARTSIG:By Mouth   cyanocobalamin (,VITAMIN B-12,) 1000 MCG/ML injection Inject 64mL (1,000 mcg) into the skin once a week for 4 weeks and then once every thirty days.   latanoprost (XALATAN) 0.005 % ophthalmic solution Place 1 drop into both eyes at bedtime.   losartan (COZAAR) 50 MG tablet TAKE ONE TABLET BY MOUTH EVERY DAY   Multiple Vitamins-Minerals (PRESERVISION AREDS  2) CAPS Take 1 capsule by mouth 2 (two) times daily.   naproxen sodium (ANAPROX) 220 MG tablet Take 220 mg by mouth 2 (two) times daily with a meal.   omeprazole (PRILOSEC) 20 MG capsule Take 20 mg by mouth daily.   simvastatin (  ZOCOR) 20 MG tablet TAKE 1 TABLET BY MOUTH AT BEDTIME   SYRINGE-NEEDLE, DISP, 3 ML (BD ECLIPSE SYRINGE) 25G X 1" 3 ML MISC Use as directed with b12 injections.   timolol (TIMOPTIC) 0.5 % ophthalmic solution Place 1 drop into the right eye daily.    ZYRTEC ALLERGY 10 MG tablet Take by mouth.   [DISCONTINUED] metoprolol succinate (TOPROL-XL) 25 MG 24 hr tablet TAKE 1 TABLET BY MOUTH DAILY (Patient not taking: Reported on 10/27/2019)   Facility-Administered Encounter Medications as of 01/16/2021  Medication   cyanocobalamin ((VITAMIN B-12)) injection 1,000 mcg     Lab Results  Component Value Date   WBC 5.5 07/12/2020   HGB 14.6 07/12/2020   HCT 43.3 07/12/2020   PLT 199.0 07/12/2020   GLUCOSE 82 01/16/2021   CHOL 171 01/16/2021   TRIG 219.0 (H) 01/16/2021   HDL 55.60 01/16/2021   LDLDIRECT 95.0 01/16/2021   LDLCALC 94 06/02/2018   ALT 14 01/16/2021   AST 15 01/16/2021   NA 135 01/16/2021   K 4.3 01/16/2021   CL 102 01/16/2021   CREATININE 0.55 01/16/2021   BUN 9 01/16/2021   CO2 26 01/16/2021   TSH 1.98 01/16/2021       Assessment & Plan:   Problem List Items Addressed This Visit     Aortic atherosclerosis (HCC)    Continue simvastatin.  Follow lipid panel and liver function tests.        B12 deficiency    Check B12 level.       Relevant Orders   Vitamin B12 (Completed)   Back pain    Discussed scheduled tylenol and antiinflammatory.  Discussed further evaluation.  She desires no further intervention at this time.  Follow.       GERD (gastroesophageal reflux disease)    No upper symptoms.  Controlled on prilosec.       Hypercholesterolemia    Continue simvastatin.  Follow lipid panel and liver function tests.        Relevant Orders    TSH (Completed)   Lipid panel (Completed)   Hepatic function panel (Completed)   Hypertension    On losartan and amlodipine.  Blood pressure doing well.  Follow pressures.  Follow metabolic panel.       Hyponatremia    Recheck sodium today to confirm wnl.       Relevant Orders   Basic metabolic panel (Completed)   Numbness and tingling of both feet    Recheck B12 level.  Discussed trial of alpha lipoic acid.  Follow.        Osteoporosis - Primary   Relevant Orders   VITAMIN D 25 Hydroxy (Vit-D Deficiency, Fractures) (Completed)     Dale [place], MD

## 2021-01-17 NOTE — Progress Notes (Signed)
Left message for patient to return call back for lab results.

## 2021-01-19 ENCOUNTER — Telehealth: Payer: Self-pay | Admitting: Internal Medicine

## 2021-01-19 NOTE — Telephone Encounter (Signed)
Patient returned office phone call for lab results. Patient will be leaving her house a little before 3pm and returning after 4pm

## 2021-01-21 ENCOUNTER — Encounter: Payer: Self-pay | Admitting: Internal Medicine

## 2021-01-21 NOTE — Assessment & Plan Note (Signed)
Continue simvastatin.  Follow lipid panel and liver function tests.   

## 2021-01-21 NOTE — Assessment & Plan Note (Signed)
On losartan and amlodipine.  Blood pressure doing well.  Follow pressures.  Follow metabolic panel.  

## 2021-01-21 NOTE — Assessment & Plan Note (Signed)
Discussed scheduled tylenol and antiinflammatory.  Discussed further evaluation.  She desires no further intervention at this time.  Follow.

## 2021-01-21 NOTE — Assessment & Plan Note (Addendum)
Recheck B12 level.  Discussed trial of alpha lipoic acid.  Follow.

## 2021-01-21 NOTE — Assessment & Plan Note (Signed)
No upper symptoms.  Controlled on prilosec.  

## 2021-01-21 NOTE — Assessment & Plan Note (Signed)
Recheck sodium today to confirm wnl  

## 2021-01-21 NOTE — Assessment & Plan Note (Signed)
Check B12 level. 

## 2021-02-01 ENCOUNTER — Other Ambulatory Visit: Payer: Self-pay | Admitting: Internal Medicine

## 2021-02-01 DIAGNOSIS — E7800 Pure hypercholesterolemia, unspecified: Secondary | ICD-10-CM

## 2021-05-01 DIAGNOSIS — H353131 Nonexudative age-related macular degeneration, bilateral, early dry stage: Secondary | ICD-10-CM | POA: Diagnosis not present

## 2021-05-12 ENCOUNTER — Other Ambulatory Visit: Payer: Self-pay | Admitting: Internal Medicine

## 2021-05-12 DIAGNOSIS — I10 Essential (primary) hypertension: Secondary | ICD-10-CM

## 2021-07-10 ENCOUNTER — Other Ambulatory Visit: Payer: Self-pay | Admitting: Internal Medicine

## 2021-07-10 DIAGNOSIS — I10 Essential (primary) hypertension: Secondary | ICD-10-CM

## 2021-07-17 ENCOUNTER — Ambulatory Visit: Payer: PPO | Admitting: Internal Medicine

## 2021-08-01 ENCOUNTER — Ambulatory Visit (INDEPENDENT_AMBULATORY_CARE_PROVIDER_SITE_OTHER): Payer: PPO | Admitting: Internal Medicine

## 2021-08-01 ENCOUNTER — Encounter: Payer: Self-pay | Admitting: Internal Medicine

## 2021-08-01 DIAGNOSIS — E871 Hypo-osmolality and hyponatremia: Secondary | ICD-10-CM | POA: Diagnosis not present

## 2021-08-01 DIAGNOSIS — R21 Rash and other nonspecific skin eruption: Secondary | ICD-10-CM | POA: Diagnosis not present

## 2021-08-01 DIAGNOSIS — M542 Cervicalgia: Secondary | ICD-10-CM

## 2021-08-01 DIAGNOSIS — K219 Gastro-esophageal reflux disease without esophagitis: Secondary | ICD-10-CM

## 2021-08-01 DIAGNOSIS — I10 Essential (primary) hypertension: Secondary | ICD-10-CM

## 2021-08-01 DIAGNOSIS — I700 Atherosclerosis of aorta: Secondary | ICD-10-CM | POA: Diagnosis not present

## 2021-08-01 DIAGNOSIS — R0602 Shortness of breath: Secondary | ICD-10-CM

## 2021-08-01 DIAGNOSIS — Z9109 Other allergy status, other than to drugs and biological substances: Secondary | ICD-10-CM

## 2021-08-01 DIAGNOSIS — E7800 Pure hypercholesterolemia, unspecified: Secondary | ICD-10-CM

## 2021-08-01 DIAGNOSIS — H6123 Impacted cerumen, bilateral: Secondary | ICD-10-CM | POA: Diagnosis not present

## 2021-08-01 MED ORDER — METHYLPREDNISOLONE 4 MG PO TBPK: ORAL_TABLET | ORAL | 0 refills | Status: DC

## 2021-08-01 MED ORDER — DEBROX 6.5 % OT SOLN: 4.0000 [drp] | Freq: Every day | OTIC | 0 refills | Status: DC

## 2021-08-01 NOTE — Progress Notes (Signed)
Patient ID: Caren Hazyancy J Bonano, female   DOB: [birth year], 88 y.o.   MRN: [id number] ? ? ?Subjective:  ? ? Patient ID: Caren Hazyancy J Linnemann, female    DOB: [birth year], 88 y.o.   MRN: [id number] ? ?This visit occurred during the SARS-CoV-2 public health emergency.  Safety protocols were in place, including screening questions prior to the visit, additional usage of staff PPE, and extensive cleaning of exam room while observing appropriate contact time as indicated for disinfecting solutions.  ? ?Patient here for  ?Chief Complaint  ?Patient presents with  ? Follow-up  ?  Follow up - pt c/o shoulder/neck/back pain. Also itchy, red, and raised area. Daughter checked last Wednesday when it started and nothing was there. Today daughter stated looks like a rash.  ? .  ? ?HPI ?She is accompanied by her daughter Andrey Campanile([place]).  History obtained from both of them.  Overall has been doing relatively well.  Eating.  Breathing overall stable.  No chest pain.  No abdominal pain or bowel change reported.  Did report some increased neck and shoulder pain.  Has noticed worsening recently - over the last few weeks.  Has been using biofreeze.  Noticed - increased itching and redness over this past week.  Has been applying cortisone.  No fever.   ? ? ?Past Medical History:  ?Diagnosis Date  ? Arthritis   ? Chicken pox   ? GERD (gastroesophageal reflux disease)   ? Glaucoma   ? Hypercholesterolemia   ? Hypertension   ? ?Past Surgical History:  ?Procedure Laterality Date  ? ABDOMINAL HYSTERECTOMY  1976  ? TONSILLECTOMY AND ADENOIDECTOMY  1935  ? ?Family History  ?Problem Relation Age of Onset  ? Lung cancer Sister   ?     died 2005  ? Stroke Mother   ? Stroke Father   ? Diabetes Maternal Grandmother   ? Diabetes Maternal Grandfather   ? Breast cancer Maternal Aunt   ?     7750's  ? Breast cancer Daughter   ? ?Social History  ? ?Socioeconomic History  ? Marital status: Widowed  ?  Spouse name: Not on file  ? Number of children: 5  ? Years of education: Not on  file  ? Highest education level: Not on file  ?Occupational History  ? Not on file  ?Tobacco Use  ? Smoking status: Never  ? Smokeless tobacco: Never  ?Substance and Sexual Activity  ? Alcohol use: No  ?  Alcohol/week: 0.0 standard drinks  ? Drug use: No  ? Sexual activity: Never  ?Other Topics Concern  ? Not on file  ?Social History Narrative  ? Not on file  ? ?Social Determinants of Health  ? ?Financial Resource Strain: Low Risk   ? Difficulty of Paying Living Expenses: Not hard at all  ?Food Insecurity: No Food Insecurity  ? Worried About Programme researcher, broadcasting/film/videounning Out of Food in the Last Year: Never true  ? Ran Out of Food in the Last Year: Never true  ?Transportation Needs: No Transportation Needs  ? Lack of Transportation (Medical): No  ? Lack of Transportation (Non-Medical): No  ?Physical Activity: Not on file  ?Stress: No Stress Concern Present  ? Feeling of Stress : Not at all  ?Social Connections: Unknown  ? Frequency of Communication with Friends and Family: More than three times a week  ? Frequency of Social Gatherings with Friends and Family: Not on file  ? Attends Religious Services: Not on file  ? Active Member of  Clubs or Organizations: Not on file  ? Attends Banker Meetings: Not on file  ? Marital Status: Widowed  ? ? ? ?Review of Systems  ?Constitutional:  Negative for appetite change, fever and unexpected weight change.  ?HENT:  Negative for congestion and sinus pressure.   ?Respiratory:  Negative for cough and chest tightness.   ?     Breathing stable.   ?Cardiovascular:  Negative for chest pain, palpitations and leg swelling.  ?Gastrointestinal:  Negative for abdominal pain, diarrhea, nausea and vomiting.  ?Genitourinary:  Negative for difficulty urinating and dysuria.  ?Musculoskeletal:  Positive for back pain and neck pain. Negative for myalgias.  ?Skin:  Positive for rash. Negative for wound.  ?Neurological:  Negative for dizziness and headaches.  ?Psychiatric/Behavioral:  Negative for agitation  and dysphoric mood.   ? ?   ?Objective:  ?  ? ?BP 126/84 (BP Location: Right Arm, Patient Position: Sitting, Cuff Size: Small)   Pulse 76   Temp 98.6 ?F (37 ?C) (Temporal)   Resp 17   Ht 5\' 5"  (1.651 m)   Wt 165 lb (74.8 kg)   SpO2 98%   BMI 27.46 kg/m?  ?Wt Readings from Last 3 Encounters:  ?08/01/21 165 lb (74.8 kg)  ?01/16/21 163 lb (73.9 kg)  ?10/27/20 161 lb (73 kg)  ? ? ?Physical Exam ?Vitals reviewed.  ?Constitutional:   ?   General: She is not in acute distress. ?   Appearance: Normal appearance.  ?HENT:  ?   Head: Normocephalic and atraumatic.  ?   Right Ear: External ear normal.  ?   Left Ear: External ear normal.  ?Eyes:  ?   General: No scleral icterus.    ?   Right eye: No discharge.     ?   Left eye: No discharge.  ?   Conjunctiva/sclera: Conjunctivae normal.  ?Neck:  ?   Thyroid: No thyromegaly.  ?Cardiovascular:  ?   Rate and Rhythm: Normal rate and regular rhythm.  ?Pulmonary:  ?   Effort: No respiratory distress.  ?   Breath sounds: Normal breath sounds. No wheezing.  ?Abdominal:  ?   General: Bowel sounds are normal.  ?   Palpations: Abdomen is soft.  ?   Tenderness: There is no abdominal tenderness.  ?Musculoskeletal:     ?   General: No swelling or tenderness.  ?   Cervical back: Neck supple. No tenderness.  ?Lymphadenopathy:  ?   Cervical: No cervical adenopathy.  ?Skin: ?   Comments: Some increased erythema with few scattered lesion.  Does not appear to be c/w shingles rash - neck/shoulder.    ?Neurological:  ?   Mental Status: She is alert.  ?Psychiatric:     ?   Mood and Affect: Mood normal.     ?   Behavior: Behavior normal.  ? ? ? ?Outpatient Encounter Medications as of 08/01/2021  ?Medication Sig  ? amLODipine (NORVASC) 2.5 MG tablet TAKE 1 TABLET BY MOUTH DAILY  ? aspirin EC 81 MG tablet Take 81 mg by mouth daily.  ? azelastine (ASTELIN) 0.1 % nasal spray Place 1 spray into both nostrils 2 (two) times daily. Use in each nostril as directed  ? CALCIUM 500/D 500-400 MG-UNIT tablet  SMARTSIG:By Mouth  ? cyanocobalamin (,VITAMIN B-12,) 1000 MCG/ML injection Inject 26mL (1,000 mcg) into the skin once a week for 4 weeks and then once every thirty days.  ? latanoprost (XALATAN) 0.005 % ophthalmic solution Place 1 drop into  both eyes at bedtime.  ? Multiple Vitamins-Minerals (PRESERVISION AREDS 2) CAPS Take 1 capsule by mouth 2 (two) times daily.  ? naproxen sodium (ANAPROX) 220 MG tablet Take 220 mg by mouth 2 (two) times daily with a meal.  ? omeprazole (PRILOSEC) 20 MG capsule Take 20 mg by mouth daily.  ? simvastatin (ZOCOR) 20 MG tablet TAKE 1 TABLET BY MOUTH AT BEDTIME  ? ZYRTEC ALLERGY 10 MG tablet Take by mouth.  ? [DISCONTINUED] carbamide peroxide (DEBROX) 6.5 % OTIC solution Place 4 drops into both ears daily. (Patient not taking: Reported on 08/10/2021)  ? [DISCONTINUED] losartan (COZAAR) 50 MG tablet TAKE ONE TABLET BY MOUTH EVERY DAY  ? [DISCONTINUED] methylPREDNISolone (MEDROL DOSEPAK) 4 MG TBPK tablet Medrol dosepack - 6 day taper.  Take as directe.d  ? [DISCONTINUED] SYRINGE-NEEDLE, DISP, 3 ML (BD ECLIPSE SYRINGE) 25G X 1" 3 ML MISC Use as directed with b12 injections.  ? [DISCONTINUED] timolol (TIMOPTIC) 0.5 % ophthalmic solution Place 1 drop into the right eye daily.   ? [DISCONTINUED] cyanocobalamin ((VITAMIN B-12)) injection 1,000 mcg   ? ?No facility-administered encounter medications on file as of 08/01/2021.  ?  ? ?Lab Results  ?Component Value Date  ? WBC 6.5 08/10/2021  ? HGB 14.2 08/10/2021  ? HCT 42.6 08/10/2021  ? PLT 203.0 08/10/2021  ? GLUCOSE 109 (H) 08/10/2021  ? CHOL 204 (H) 08/10/2021  ? TRIG 187.0 (H) 08/10/2021  ? HDL 60.10 08/10/2021  ? LDLDIRECT 95.0 01/16/2021  ? LDLCALC 107 (H) 08/10/2021  ? ALT 13 08/10/2021  ? AST 13 08/10/2021  ? NA 138 08/10/2021  ? K 4.3 08/10/2021  ? CL 104 08/10/2021  ? CREATININE 0.68 08/10/2021  ? BUN 15 08/10/2021  ? CO2 27 08/10/2021  ? TSH 1.98 01/16/2021  ? ? ?   ?Assessment & Plan:  ? ?Problem List Items Addressed This Visit   ? ?  Aortic atherosclerosis (HCC)  ?  Continue simvastatin.  Follow lipid panel and liver function tests.   ? ?  ?  ? Cerumen impaction  ?  Debrox.  Return for ear irrigation.  ? ?  ?  ? Environmental allergies  ?  C

## 2021-08-09 ENCOUNTER — Other Ambulatory Visit: Payer: Self-pay | Admitting: Internal Medicine

## 2021-08-09 DIAGNOSIS — I10 Essential (primary) hypertension: Secondary | ICD-10-CM

## 2021-08-10 ENCOUNTER — Telehealth: Payer: Self-pay | Admitting: Internal Medicine

## 2021-08-10 ENCOUNTER — Encounter: Payer: Self-pay | Admitting: Internal Medicine

## 2021-08-10 ENCOUNTER — Ambulatory Visit (INDEPENDENT_AMBULATORY_CARE_PROVIDER_SITE_OTHER): Payer: PPO

## 2021-08-10 ENCOUNTER — Other Ambulatory Visit: Payer: Self-pay

## 2021-08-10 ENCOUNTER — Ambulatory Visit (INDEPENDENT_AMBULATORY_CARE_PROVIDER_SITE_OTHER): Payer: PPO | Admitting: Internal Medicine

## 2021-08-10 DIAGNOSIS — E7800 Pure hypercholesterolemia, unspecified: Secondary | ICD-10-CM

## 2021-08-10 DIAGNOSIS — I10 Essential (primary) hypertension: Secondary | ICD-10-CM

## 2021-08-10 DIAGNOSIS — R21 Rash and other nonspecific skin eruption: Secondary | ICD-10-CM

## 2021-08-10 DIAGNOSIS — H6123 Impacted cerumen, bilateral: Secondary | ICD-10-CM | POA: Diagnosis not present

## 2021-08-10 LAB — BASIC METABOLIC PANEL
BUN: 15 mg/dL (ref 6–23)
CO2: 27 mEq/L (ref 19–32)
Calcium: 9.5 mg/dL (ref 8.4–10.5)
Chloride: 104 mEq/L (ref 96–112)
Creatinine, Ser: 0.68 mg/dL (ref 0.40–1.20)
GFR: 74.66 mL/min (ref >60.00)
Glucose, Bld: 109 mg/dL — ABNORMAL HIGH (ref 70–99)
Potassium: 4.3 mEq/L (ref 3.5–5.1)
Sodium: 138 mEq/L (ref 135–145)

## 2021-08-10 LAB — CBC WITH DIFFERENTIAL/PLATELET
Basophils Absolute: 0.1 10*3/uL (ref 0.0–0.1)
Basophils Relative: 0.9 % (ref 0.0–3.0)
Eosinophils Absolute: 0.3 10*3/uL (ref 0.0–0.7)
Eosinophils Relative: 5.1 % — ABNORMAL HIGH (ref 0.0–5.0)
HCT: 42.6 % (ref 36.0–46.0)
Hemoglobin: 14.2 g/dL (ref 12.0–15.0)
Lymphocytes Relative: 22.9 % (ref 12.0–46.0)
Lymphs Abs: 1.5 10*3/uL (ref 0.7–4.0)
MCHC: 33.3 g/dL (ref 30.0–36.0)
MCV: 95.4 fl (ref 78.0–100.0)
Monocytes Absolute: 0.5 10*3/uL (ref 0.1–1.0)
Monocytes Relative: 8.2 % (ref 3.0–12.0)
Neutro Abs: 4.1 10*3/uL (ref 1.4–7.7)
Neutrophils Relative %: 62.9 % (ref 43.0–77.0)
Platelets: 203 10*3/uL (ref 150.0–400.0)
RBC: 4.47 Mil/uL (ref 3.87–5.11)
RDW: 13.7 % (ref 11.5–15.5)
WBC: 6.5 10*3/uL (ref 4.0–10.5)

## 2021-08-10 LAB — LIPID PANEL
Cholesterol: 204 mg/dL — ABNORMAL HIGH (ref 0–200)
HDL: 60.1 mg/dL (ref >39.00)
LDL Cholesterol: 107 mg/dL — ABNORMAL HIGH (ref 0–99)
NonHDL: 143.94
Total CHOL/HDL Ratio: 3
Triglycerides: 187 mg/dL — ABNORMAL HIGH (ref 0.0–149.0)
VLDL: 37.4 mg/dL (ref 0.0–40.0)

## 2021-08-10 LAB — HEPATIC FUNCTION PANEL
ALT: 13 U/L (ref 0–35)
AST: 13 U/L (ref 0–37)
Albumin: 3.9 g/dL (ref 3.5–5.2)
Alkaline Phosphatase: 59 U/L (ref 39–117)
Bilirubin, Direct: 0.1 mg/dL (ref 0.0–0.3)
Total Bilirubin: 0.8 mg/dL (ref 0.2–1.2)
Total Protein: 6 g/dL (ref 6.0–8.3)

## 2021-08-10 MED ORDER — VALACYCLOVIR HCL 1 G PO TABS: 1000.0000 mg | ORAL_TABLET | Freq: Three times a day (TID) | ORAL | 0 refills | Status: AC

## 2021-08-10 MED ORDER — GABAPENTIN 100 MG PO CAPS: 100.0000 mg | ORAL_CAPSULE | Freq: Every day | ORAL | 1 refills | Status: DC

## 2021-08-10 NOTE — Progress Notes (Signed)
Patient came in today for ear irrigation of both ears. Patient had been using the debrox eardrops & wax was already softened. I was able to use warm water with peroxide in both ears & clean out a large amount of wax from both ears. Pt seemed to be relieved & tolerated process well.  ? ?Also as you are aware that patient's itchy rash on left shoulder did reappear after finishing prednisone on Sunday. Patient's daughter was concerned & wanted PCP aware.  ?

## 2021-08-10 NOTE — Telephone Encounter (Signed)
Patient's daughter called and said the her mother was seen by Dr Lorin Picket today. None of her medication that was prescribed at today's visit has been sent to [facility], [place]. Please send. ?

## 2021-08-10 NOTE — Progress Notes (Signed)
Patient ID: Janet Moran, female   DOB: 26-Sep-1925, 88 y.o.   MRN: [id number] ? ? ?Subjective:  ? ? Patient ID: Janet Hazyancy J Coletti, female    DOB: 26-Sep-1925, 88 y.o.   MRN: [id number] ? ?This visit occurred during the SARS-CoV-2 public health emergency.  Safety protocols were in place, including screening questions prior to the visit, additional usage of staff PPE, and extensive cleaning of exam room while observing appropriate contact time as indicated for disinfecting solutions.  ? ?Patient here for work in appt.   ? ?Chief Complaint  ?Patient presents with  ? Rash  ? .  ? ?HPI ?Work in for persistent / worsening rash.  Evaluated last week. Concern regarding possible reaction to biofreeze.  Has been having increased neck and shoulder pain.  Was placed on medrol dose pack.  Reports not significant improvement in pain.  Rash - more diffuse/confluent and localized to shoulder/neck (left).  No headache.  Eating.  No nausea or vomiting.  ? ? ?Past Medical History:  ?Diagnosis Date  ? Arthritis   ? Chicken pox   ? GERD (gastroesophageal reflux disease)   ? Glaucoma   ? Hypercholesterolemia   ? Hypertension   ? ?Past Surgical History:  ?Procedure Laterality Date  ? ABDOMINAL HYSTERECTOMY  1976  ? TONSILLECTOMY AND ADENOIDECTOMY  1935  ? ?Family History  ?Problem Relation Age of Onset  ? Lung cancer Sister   ?     died 2005  ? Stroke Mother   ? Stroke Father   ? Diabetes Maternal Grandmother   ? Diabetes Maternal Grandfather   ? Breast cancer Maternal Aunt   ?     4150's  ? Breast cancer Daughter   ? ?Social History  ? ?Socioeconomic History  ? Marital status: Widowed  ?  Spouse name: Not on file  ? Number of children: 5  ? Years of education: Not on file  ? Highest education level: Not on file  ?Occupational History  ? Not on file  ?Tobacco Use  ? Smoking status: Never  ? Smokeless tobacco: Never  ?Substance and Sexual Activity  ? Alcohol use: No  ?  Alcohol/week: 0.0 standard drinks  ? Drug use: No  ? Sexual activity: Never   ?Other Topics Concern  ? Not on file  ?Social History Narrative  ? Not on file  ? ?Social Determinants of Health  ? ?Financial Resource Strain: Low Risk   ? Difficulty of Paying Living Expenses: Not hard at all  ?Food Insecurity: No Food Insecurity  ? Worried About Programme researcher, broadcasting/film/videounning Out of Food in the Last Year: Never true  ? Ran Out of Food in the Last Year: Never true  ?Transportation Needs: No Transportation Needs  ? Lack of Transportation (Medical): No  ? Lack of Transportation (Non-Medical): No  ?Physical Activity: Not on file  ?Stress: No Stress Concern Present  ? Feeling of Stress : Not at all  ?Social Connections: Unknown  ? Frequency of Communication with Friends and Family: More than three times a week  ? Frequency of Social Gatherings with Friends and Family: Not on file  ? Attends Religious Services: Not on file  ? Active Member of Clubs or Organizations: Not on file  ? Attends BankerClub or Organization Meetings: Not on file  ? Marital Status: Widowed  ? ? ? ?Review of Systems  ?Constitutional:  Positive for fatigue. Negative for appetite change and fever.  ?HENT:  Negative for congestion and sinus pressure.   ?Respiratory:  Negative for cough and chest tightness.   ?     Breathing stable.   ?Cardiovascular:  Negative for chest pain, palpitations and leg swelling.  ?Gastrointestinal:  Negative for abdominal pain, diarrhea, nausea and vomiting.  ?Genitourinary:  Negative for difficulty urinating and dysuria.  ?Musculoskeletal:  Positive for neck pain. Negative for joint swelling.  ?Skin:  Positive for rash. Negative for wound.  ?Neurological:  Negative for dizziness and headaches.  ?Psychiatric/Behavioral:  Negative for agitation and dysphoric mood.   ? ?   ?Objective:  ?  ? ?BP 122/86 (BP Location: Right Arm, Patient Position: Sitting, Cuff Size: Large)   Pulse 88   Temp 98 ?F (36.7 ?C) (Oral)   Ht 5\' 5"  (1.651 m)   SpO2 98%   BMI 27.46 kg/m?  ?Wt Readings from Last 3 Encounters:  ?08/01/21 165 lb (74.8 kg)   ?01/16/21 163 lb (73.9 kg)  ?10/27/20 161 lb (73 kg)  ? ? ?Physical Exam ?Vitals reviewed.  ?Constitutional:   ?   General: She is not in acute distress. ?   Appearance: Normal appearance.  ?HENT:  ?   Head: Normocephalic and atraumatic.  ?   Right Ear: External ear normal.  ?   Left Ear: External ear normal.  ?Eyes:  ?   General: No scleral icterus.    ?   Right eye: No discharge.     ?   Left eye: No discharge.  ?   Conjunctiva/sclera: Conjunctivae normal.  ?Neck:  ?   Thyroid: No thyromegaly.  ?Cardiovascular:  ?   Rate and Rhythm: Normal rate and regular rhythm.  ?Pulmonary:  ?   Effort: No respiratory distress.  ?   Breath sounds: Normal breath sounds. No wheezing.  ?Abdominal:  ?   General: Bowel sounds are normal.  ?   Palpations: Abdomen is soft.  ?   Tenderness: There is no abdominal tenderness.  ?Musculoskeletal:     ?   General: No swelling or tenderness.  ?   Cervical back: Neck supple. No tenderness.  ?Lymphadenopathy:  ?   Cervical: No cervical adenopathy.  ?Skin: ?   Comments: Erythematous based rash - neck and upper shoulder.  Appears to be c/w shingles rash.  No eye involvement.   ?Neurological:  ?   Mental Status: She is alert.  ?Psychiatric:     ?   Mood and Affect: Mood normal.     ?   Behavior: Behavior normal.  ? ? ? ?Outpatient Encounter Medications as of 08/10/2021  ?Medication Sig  ? amLODipine (NORVASC) 2.5 MG tablet TAKE 1 TABLET BY MOUTH DAILY  ? aspirin EC 81 MG tablet Take 81 mg by mouth daily.  ? azelastine (ASTELIN) 0.1 % nasal spray Place 1 spray into both nostrils 2 (two) times daily. Use in each nostril as directed  ? CALCIUM 500/D 500-400 MG-UNIT tablet SMARTSIG:By Mouth  ? cyanocobalamin (,VITAMIN B-12,) 1000 MCG/ML injection Inject 64mL (1,000 mcg) into the skin once a week for 4 weeks and then once every thirty days.  ? gabapentin (NEURONTIN) 100 MG capsule Take 1 capsule (100 mg total) by mouth at bedtime.  ? latanoprost (XALATAN) 0.005 % ophthalmic solution Place 1 drop into  both eyes at bedtime.  ? losartan (COZAAR) 50 MG tablet TAKE ONE TABLET BY MOUTH EVERY DAY  ? Multiple Vitamins-Minerals (PRESERVISION AREDS 2) CAPS Take 1 capsule by mouth 2 (two) times daily.  ? naproxen sodium (ANAPROX) 220 MG tablet Take 220 mg by mouth 2 (  two) times daily with a meal.  ? omeprazole (PRILOSEC) 20 MG capsule Take 20 mg by mouth daily.  ? simvastatin (ZOCOR) 20 MG tablet TAKE 1 TABLET BY MOUTH AT BEDTIME  ? [EXPIRED] valACYclovir (VALTREX) 1000 MG tablet Take 1 tablet (1,000 mg total) by mouth 3 (three) times daily for 7 days.  ? ZYRTEC ALLERGY 10 MG tablet Take by mouth.  ? [DISCONTINUED] methylPREDNISolone (MEDROL DOSEPAK) 4 MG TBPK tablet Medrol dosepack - 6 day taper.  Take as directe.d  ? [DISCONTINUED] carbamide peroxide (DEBROX) 6.5 % OTIC solution Place 4 drops into both ears daily. (Patient not taking: Reported on 08/10/2021)  ? ?No facility-administered encounter medications on file as of 08/10/2021.  ?  ? ?Lab Results  ?Component Value Date  ? WBC 6.5 08/10/2021  ? HGB 14.2 08/10/2021  ? HCT 42.6 08/10/2021  ? PLT 203.0 08/10/2021  ? GLUCOSE 109 (H) 08/10/2021  ? CHOL 204 (H) 08/10/2021  ? TRIG 187.0 (H) 08/10/2021  ? HDL 60.10 08/10/2021  ? LDLDIRECT 95.0 01/16/2021  ? LDLCALC 107 (H) 08/10/2021  ? ALT 13 08/10/2021  ? AST 13 08/10/2021  ? NA 138 08/10/2021  ? K 4.3 08/10/2021  ? CL 104 08/10/2021  ? CREATININE 0.68 08/10/2021  ? BUN 15 08/10/2021  ? CO2 27 08/10/2021  ? TSH 1.98 01/16/2021  ? ? ?   ?Assessment & Plan:  ? ?Problem List Items Addressed This Visit   ? ? Hypertension  ?  On losartan and amlodipine.  Blood pressure doing well.  Follow pressures.  Follow metabolic panel. Labs checked today.  ? ?  ?  ? Rash  ?  Persistent rash.  Increased erythema - localized to shoulder/neck - left.  Appears to be more c/w shingles rash.  Discussed shingles.  Discussed treatment.  With increased neck and shoulder pain.  Will prescribe valtrex as directed.  Discussed possible decrease benefit -  giving time of start of rash. She is taking tylenol.  Add low dose gabapentin.  Follow.  Call with update.  ? ?  ?  ? ? ? ?Dale Parc, MD  ?

## 2021-08-10 NOTE — Telephone Encounter (Signed)
Called and discussed medication.  Rx sent in for gabapentin and valtrex.  Call [facility] and clarify medication ?

## 2021-08-10 NOTE — Telephone Encounter (Signed)
Okey Regal from [facility] called stating a family member want to know if there was any prescriptions coming from the provider. Pt has shingles and she was thinking it may be in regards to that ?

## 2021-08-11 NOTE — Telephone Encounter (Signed)
Late entry.  Called and spoke to pts daughter Andrey Campanile. Discussed.  Medications sent in for gabapentin and valtrex.  [facility] notified and clarified medications for this pt.   ?

## 2021-08-20 ENCOUNTER — Encounter: Payer: Self-pay | Admitting: Internal Medicine

## 2021-08-20 DIAGNOSIS — R21 Rash and other nonspecific skin eruption: Secondary | ICD-10-CM | POA: Insufficient documentation

## 2021-08-20 DIAGNOSIS — M542 Cervicalgia: Secondary | ICD-10-CM | POA: Insufficient documentation

## 2021-08-20 DIAGNOSIS — H6120 Impacted cerumen, unspecified ear: Secondary | ICD-10-CM | POA: Insufficient documentation

## 2021-08-20 NOTE — Assessment & Plan Note (Signed)
Continue antihistamine.  Stable.  

## 2021-08-20 NOTE — Assessment & Plan Note (Addendum)
Neck and shoulder pain as outlined.  Apparently worse over the last few weeks.  Rash present now.  Does not appear to be c/w shingles rash.  Scheduled tylenol for pain.  Given increased itching and rash, medrol dose pack as directed.  See if improves pain.  Follow.  ?

## 2021-08-20 NOTE — Assessment & Plan Note (Signed)
Persistent rash.  Increased erythema - localized to shoulder/neck - left.  Appears to be more c/w shingles rash.  Discussed shingles.  Discussed treatment.  With increased neck and shoulder pain.  Will prescribe valtrex as directed.  Discussed possible decrease benefit - giving time of start of rash. She is taking tylenol.  Add low dose gabapentin.  Follow.  Call with update.  ?

## 2021-08-20 NOTE — Assessment & Plan Note (Signed)
Debrox.  Return for ear irrigation.  ?

## 2021-08-20 NOTE — Assessment & Plan Note (Signed)
Continue simvastatin.  Follow lipid panel and liver function tests.   

## 2021-08-20 NOTE — Assessment & Plan Note (Signed)
Feels her breathing is stable.  Had previously discussed cardiac reevaluation.  She previously declined and continues to decline.  Breathing stable.  Will notify me if change.  ?

## 2021-08-20 NOTE — Assessment & Plan Note (Signed)
On losartan and amlodipine.  Blood pressure doing well.  Follow pressures.  Follow metabolic panel. Labs checked today.  ?

## 2021-08-20 NOTE — Assessment & Plan Note (Signed)
Concern over possible reaction to biofreeze.  Discussed shingles.  Rash doe not appear to be c/w shingles.  Has been applying topical steroid.  Medrol dose pack as directed.  Follow.  Call with update.   ?

## 2021-08-20 NOTE — Assessment & Plan Note (Signed)
On losartan and amlodipine.  Blood pressure doing well.  Follow pressures.  Follow metabolic panel.  

## 2021-08-20 NOTE — Assessment & Plan Note (Signed)
No upper symptoms.  Controlled on prilosec.  

## 2021-08-20 NOTE — Assessment & Plan Note (Signed)
Check sodium next labs to confirm wnl.  ?

## 2021-08-22 ENCOUNTER — Telehealth: Payer: Self-pay

## 2021-08-22 NOTE — Telephone Encounter (Signed)
Lm for pt to cb re : results ? ?I reviewed your recent lab results and your cholesterol levels are ok.  Your hemoglobin, kidney function tests and liver function tests are within normal limits.  Let us know if you need anything.   ?  ?Dr Lorin Picket ?

## 2021-10-27 ENCOUNTER — Telehealth: Payer: Self-pay | Admitting: Internal Medicine

## 2021-10-27 NOTE — Telephone Encounter (Signed)
Copied from CRM [id number]. Topic: Medicare AWV >> Oct 27, 2021 11:11 AM Payton Doughty wrote: Reason for CRM: Attempted to schedule AWV. Unable to LVM.  Will try at later time.

## 2021-10-30 DIAGNOSIS — H353131 Nonexudative age-related macular degeneration, bilateral, early dry stage: Secondary | ICD-10-CM | POA: Diagnosis not present

## 2021-10-30 DIAGNOSIS — H40053 Ocular hypertension, bilateral: Secondary | ICD-10-CM | POA: Diagnosis not present

## 2021-10-31 ENCOUNTER — Other Ambulatory Visit: Payer: Self-pay | Admitting: Internal Medicine

## 2021-10-31 DIAGNOSIS — E7800 Pure hypercholesterolemia, unspecified: Secondary | ICD-10-CM

## 2021-11-01 ENCOUNTER — Ambulatory Visit (INDEPENDENT_AMBULATORY_CARE_PROVIDER_SITE_OTHER): Payer: PPO | Admitting: Internal Medicine

## 2021-11-01 ENCOUNTER — Encounter: Payer: Self-pay | Admitting: Internal Medicine

## 2021-11-01 DIAGNOSIS — E871 Hypo-osmolality and hyponatremia: Secondary | ICD-10-CM | POA: Diagnosis not present

## 2021-11-01 DIAGNOSIS — I10 Essential (primary) hypertension: Secondary | ICD-10-CM | POA: Diagnosis not present

## 2021-11-01 DIAGNOSIS — I700 Atherosclerosis of aorta: Secondary | ICD-10-CM

## 2021-11-01 DIAGNOSIS — K219 Gastro-esophageal reflux disease without esophagitis: Secondary | ICD-10-CM | POA: Diagnosis not present

## 2021-11-01 DIAGNOSIS — M549 Dorsalgia, unspecified: Secondary | ICD-10-CM

## 2021-11-01 DIAGNOSIS — M542 Cervicalgia: Secondary | ICD-10-CM

## 2021-11-01 DIAGNOSIS — R0602 Shortness of breath: Secondary | ICD-10-CM

## 2021-11-01 DIAGNOSIS — E7800 Pure hypercholesterolemia, unspecified: Secondary | ICD-10-CM | POA: Diagnosis not present

## 2021-11-01 DIAGNOSIS — Z9109 Other allergy status, other than to drugs and biological substances: Secondary | ICD-10-CM | POA: Diagnosis not present

## 2021-11-01 NOTE — Progress Notes (Signed)
Patient ID: Janet Moran, female   DOB: May 13, 1925, 88 y.o.   MRN: [id number]   Subjective:    Patient ID: Janet Moran, female    DOB: [birth year], 88 y.o.   MRN: [id number]   Patient here for a scheduled follow up.   Chief Complaint  Patient presents with   Hypertension   .   HPI Here to follow up regarding her blood pressure and cholesterol.  She is accompanied by her daughter Andrey Campanile.  History obtained from both of them.  Reports she is doing better.  Recently diagnosed with shingles.  Resolved.  Pain resolved.  No chest pain.  Breathing stable.  Feeling better.  No abdominal pain.  Bowels moving.     Past Medical History:  Diagnosis Date   Arthritis    Chicken pox    GERD (gastroesophageal reflux disease)    Glaucoma    Hypercholesterolemia    Hypertension    Past Surgical History:  Procedure Laterality Date   ABDOMINAL HYSTERECTOMY  1976   TONSILLECTOMY AND ADENOIDECTOMY  1935   Family History  Problem Relation Age of Onset   Lung cancer Sister        died 2   Stroke Mother    Stroke Father    Diabetes Maternal Grandmother    Diabetes Maternal Grandfather    Breast cancer Maternal Aunt        63's   Breast cancer Daughter    Social History   Socioeconomic History   Marital status: Widowed    Spouse name: Not on file   Number of children: 5   Years of education: Not on file   Highest education level: Not on file  Occupational History   Not on file  Tobacco Use   Smoking status: Never   Smokeless tobacco: Never  Substance and Sexual Activity   Alcohol use: No    Alcohol/week: 0.0 standard drinks of alcohol   Drug use: No   Sexual activity: Never  Other Topics Concern   Not on file  Social History Narrative   Not on file   Social Determinants of Health   Financial Resource Strain: Low Risk  (10/27/2020)   Overall Financial Resource Strain (CARDIA)    Difficulty of Paying Living Expenses: Not hard at all  Food Insecurity: No Food Insecurity  (10/27/2020)   Hunger Vital Sign    Worried About Running Out of Food in the Last Year: Never true    Ran Out of Food in the Last Year: Never true  Transportation Needs: No Transportation Needs (10/27/2020)   PRAPARE - Administrator, Civil Service (Medical): No    Lack of Transportation (Non-Medical): No  Physical Activity: Inactive (10/24/2018)   Exercise Vital Sign    Days of Exercise per Week: 0 days    Minutes of Exercise per Session: 0 min  Stress: No Stress Concern Present (10/27/2020)   [employer] of Occupational Health - Occupational Stress Questionnaire    Feeling of Stress : Not at all  Social Connections: Unknown (10/27/2020)   Social Connection and Isolation Panel [NHANES]    Frequency of Communication with Friends and Family: More than three times a week    Frequency of Social Gatherings with Friends and Family: Not on file    Attends Religious Services: Not on file    Active Member of Clubs or Organizations: Not on file    Attends Banker Meetings: Not on file    Marital  Status: Widowed     Review of Systems  Constitutional:  Negative for appetite change and unexpected weight change.  HENT:  Negative for congestion and sinus pressure.   Respiratory:  Negative for cough and chest tightness.        Breathing stable.   Cardiovascular:  Negative for chest pain, palpitations and leg swelling.  Gastrointestinal:  Negative for abdominal pain, diarrhea, nausea and vomiting.  Genitourinary:  Negative for difficulty urinating and dysuria.  Musculoskeletal:  Negative for joint swelling and myalgias.  Skin:  Negative for color change and rash.  Neurological:  Negative for dizziness, light-headedness and headaches.  Psychiatric/Behavioral:  Negative for agitation and dysphoric mood.        Objective:     BP 126/70 (BP Location: Left Arm, Patient Position: Sitting, Cuff Size: Small)   Pulse 83   Temp 98.6 F (37 C) (Temporal)   Resp 17   Ht  5\' 5"  (1.651 m)   Wt 160 lb (72.6 kg)   SpO2 98%   BMI 26.63 kg/m  Wt Readings from Last 3 Encounters:  11/01/21 160 lb (72.6 kg)  08/10/21 165 lb (74.8 kg)  08/01/21 165 lb (74.8 kg)    Physical Exam Vitals reviewed.  Constitutional:      General: She is not in acute distress.    Appearance: Normal appearance.  HENT:     Head: Normocephalic and atraumatic.     Right Ear: External ear normal.     Left Ear: External ear normal.  Eyes:     General: No scleral icterus.       Right eye: No discharge.        Left eye: No discharge.     Conjunctiva/sclera: Conjunctivae normal.  Neck:     Thyroid: No thyromegaly.  Cardiovascular:     Rate and Rhythm: Normal rate and regular rhythm.  Pulmonary:     Effort: No respiratory distress.     Breath sounds: Normal breath sounds. No wheezing.  Abdominal:     General: Bowel sounds are normal.     Palpations: Abdomen is soft.     Tenderness: There is no abdominal tenderness.  Musculoskeletal:        General: No swelling or tenderness.     Cervical back: Neck supple. No tenderness.  Lymphadenopathy:     Cervical: No cervical adenopathy.  Skin:    Findings: No erythema or rash.  Neurological:     Mental Status: She is alert.  Psychiatric:        Mood and Affect: Mood normal.        Behavior: Behavior normal.      Outpatient Encounter Medications as of 11/01/2021  Medication Sig   amLODipine (NORVASC) 2.5 MG tablet TAKE 1 TABLET BY MOUTH DAILY   aspirin EC 81 MG tablet Take 81 mg by mouth daily.   azelastine (ASTELIN) 0.1 % nasal spray Place 1 spray into both nostrils 2 (two) times daily. Use in each nostril as directed   CALCIUM 500/D 500-400 MG-UNIT tablet SMARTSIG:By Mouth   cyanocobalamin (,VITAMIN B-12,) 1000 MCG/ML injection Inject 17mL (1,000 mcg) into the skin once a week for 4 weeks and then once every thirty days.   gabapentin (NEURONTIN) 100 MG capsule Take 1 capsule (100 mg total) by mouth at bedtime.   latanoprost  (XALATAN) 0.005 % ophthalmic solution Place 1 drop into both eyes at bedtime.   losartan (COZAAR) 50 MG tablet TAKE ONE TABLET BY MOUTH EVERY DAY  Multiple Vitamins-Minerals (PRESERVISION AREDS 2) CAPS Take 1 capsule by mouth 2 (two) times daily.   naproxen sodium (ANAPROX) 220 MG tablet Take 220 mg by mouth 2 (two) times daily with a meal.   omeprazole (PRILOSEC) 20 MG capsule Take 20 mg by mouth daily.   simvastatin (ZOCOR) 20 MG tablet TAKE 1 TABLET BY MOUTH AT BEDTIME   ZYRTEC ALLERGY 10 MG tablet Take by mouth.   No facility-administered encounter medications on file as of 11/01/2021.     Lab Results  Component Value Date   WBC 6.5 08/10/2021   HGB 14.2 08/10/2021   HCT 42.6 08/10/2021   PLT 203.0 08/10/2021   GLUCOSE 109 (H) 08/10/2021   CHOL 204 (H) 08/10/2021   TRIG 187.0 (H) 08/10/2021   HDL 60.10 08/10/2021   LDLDIRECT 95.0 01/16/2021   LDLCALC 107 (H) 08/10/2021   ALT 13 08/10/2021   AST 13 08/10/2021   NA 138 08/10/2021   K 4.3 08/10/2021   CL 104 08/10/2021   CREATININE 0.68 08/10/2021   BUN 15 08/10/2021   CO2 27 08/10/2021   TSH 1.98 01/16/2021       Assessment & Plan:   Problem List Items Addressed This Visit     Aortic atherosclerosis (HCC)    Continue simvastatin.  Follow lipid panel and liver function tests.        Back pain    Stable.  Have discussed further w/up/evaluation.  Has declined. Wants to monitor.       Environmental allergies    Continue antihistamine.  Stable.       GERD (gastroesophageal reflux disease)    No upper symptoms.  Controlled on prilosec.       Hypercholesterolemia    Continue simvastatin.  Follow lipid panel and liver function tests.        Hypertension    On losartan and amlodipine.  Blood pressure doing well.  Follow pressures.  Follow metabolic panel.       Hyponatremia    Previous issue with decreased sodium.  Recent sodium wnl.  Follow.       Neck pain    Better.  Follow.        SOB (shortness of  breath)    Breathing stable.  Follow.         Dale Reno, MD

## 2021-11-12 ENCOUNTER — Encounter: Payer: Self-pay | Admitting: Internal Medicine

## 2021-11-12 NOTE — Assessment & Plan Note (Signed)
Previous issue with decreased sodium.  Recent sodium wnl.  Follow.

## 2021-11-12 NOTE — Assessment & Plan Note (Signed)
Continue simvastatin.  Follow lipid panel and liver function tests.   

## 2021-11-12 NOTE — Assessment & Plan Note (Signed)
Continue antihistamine.  Stable.

## 2021-11-12 NOTE — Assessment & Plan Note (Signed)
On losartan and amlodipine.  Blood pressure doing well.  Follow pressures.  Follow metabolic panel.  

## 2021-11-12 NOTE — Assessment & Plan Note (Signed)
Stable.  Have discussed further w/up/evaluation.  Has declined. Wants to monitor.  

## 2021-11-12 NOTE — Assessment & Plan Note (Signed)
Breathing stable.  Follow.    

## 2021-11-12 NOTE — Assessment & Plan Note (Signed)
Better.  Follow.  

## 2021-11-12 NOTE — Assessment & Plan Note (Signed)
No upper symptoms.  Controlled on prilosec.  

## 2021-12-21 ENCOUNTER — Ambulatory Visit (INDEPENDENT_AMBULATORY_CARE_PROVIDER_SITE_OTHER): Payer: PPO

## 2021-12-21 VITALS — Ht 65.0 in | Wt 160.0 lb

## 2021-12-21 DIAGNOSIS — Z0000 Encounter for general adult medical examination without abnormal findings: Secondary | ICD-10-CM | POA: Diagnosis not present

## 2021-12-21 NOTE — Progress Notes (Signed)
Subjective:   Janet Moran is a 88 y.o. female who presents for Medicare Annual (Subsequent) preventive examination.  Review of Systems    No ROS.  Medicare Wellness Virtual Visit.  Visual/audio telehealth visit, UTA vital signs.   See social history for additional risk factors.   Cardiac Risk Factors include: advanced age (>40men, >37 women);hypertension     Objective:    Today's Vitals   12/21/21 1138  Weight: 160 lb (72.6 kg)  Height: 5\' 5"  (1.651 m)   Body mass index is 26.63 kg/m.     12/21/2021   12:16 PM 10/27/2020   11:30 AM 10/27/2019   11:42 AM 10/24/2018   12:15 PM 06/03/2018   12:56 PM 10/19/2016    2:08 PM 01/30/2015    8:04 AM  Advanced Directives  Does Patient Have a Medical Advance Directive? Yes Yes Yes Yes Yes Yes Yes  Type of 02/01/2015 of [place];Living will Healthcare Power of [place];Living will Healthcare Power of [place];Living will Out of facility DNR (pink MOST or yellow form) Out of facility DNR (pink MOST or yellow form) Healthcare Power of [place];Living will   Does patient want to make changes to medical advance directive? No - Patient declined No - Patient declined No - Patient declined   No - Patient declined   Copy of Healthcare Power of Attorney in Chart? Yes - validated most recent copy scanned in chart (See row information) Yes - validated most recent copy scanned in chart (See row information) Yes - validated most recent copy scanned in chart (See row information)        Current Medications (verified) Outpatient Encounter Medications as of 12/21/2021  Medication Sig   amLODipine (NORVASC) 2.5 MG tablet TAKE 1 TABLET BY MOUTH DAILY   aspirin EC 81 MG tablet Take 81 mg by mouth daily.   azelastine (ASTELIN) 0.1 % nasal spray Place 1 spray into both nostrils 2 (two) times daily. Use in each nostril as directed   CALCIUM 500/D 500-400 MG-UNIT tablet SMARTSIG:By Mouth   cyanocobalamin (,VITAMIN B-12,) 1000 MCG/ML  injection Inject 40mL (1,000 mcg) into the skin once a week for 4 weeks and then once every thirty days.   gabapentin (NEURONTIN) 100 MG capsule Take 1 capsule (100 mg total) by mouth at bedtime.   latanoprost (XALATAN) 0.005 % ophthalmic solution Place 1 drop into both eyes at bedtime.   losartan (COZAAR) 50 MG tablet TAKE ONE TABLET BY MOUTH EVERY DAY   Multiple Vitamins-Minerals (PRESERVISION AREDS 2) CAPS Take 1 capsule by mouth 2 (two) times daily.   naproxen sodium (ANAPROX) 220 MG tablet Take 220 mg by mouth 2 (two) times daily with a meal.   omeprazole (PRILOSEC) 20 MG capsule Take 20 mg by mouth daily.   simvastatin (ZOCOR) 20 MG tablet TAKE 1 TABLET BY MOUTH AT BEDTIME   ZYRTEC ALLERGY 10 MG tablet Take by mouth.   No facility-administered encounter medications on file as of 12/21/2021.    Allergies (verified) Avelox [moxifloxacin], Crestor [rosuvastatin], Pravachol [pravastatin], and Vytorin [ezetimibe-simvastatin]   History: Past Medical History:  Diagnosis Date   Arthritis    Chicken pox    GERD (gastroesophageal reflux disease)    Glaucoma    Hypercholesterolemia    Hypertension    Past Surgical History:  Procedure Laterality Date   ABDOMINAL HYSTERECTOMY  1976   TONSILLECTOMY AND ADENOIDECTOMY  1935   Family History  Problem Relation Age of Onset   Lung cancer Sister  died 2005   Stroke Mother    Stroke Father    Diabetes Maternal Grandmother    Diabetes Maternal Grandfather    Breast cancer Maternal Aunt        41's   Breast cancer Daughter    Social History   Socioeconomic History   Marital status: Widowed    Spouse name: Not on file   Number of children: 5   Years of education: Not on file   Highest education level: Not on file  Occupational History   Not on file  Tobacco Use   Smoking status: Never   Smokeless tobacco: Never  Substance and Sexual Activity   Alcohol use: No    Alcohol/week: 0.0 standard drinks of alcohol   Drug use: No    Sexual activity: Never  Other Topics Concern   Not on file  Social History Narrative   Not on file   Social Determinants of Health   Financial Resource Strain: Low Risk  (12/21/2021)   Overall Financial Resource Strain (CARDIA)    Difficulty of Paying Living Expenses: Not hard at all  Food Insecurity: No Food Insecurity (12/21/2021)   Hunger Vital Sign    Worried About Running Out of Food in the Last Year: Never true    Ran Out of Food in the Last Year: Never true  Transportation Needs: No Transportation Needs (12/21/2021)   PRAPARE - Administrator, Civil Service (Medical): No    Lack of Transportation (Non-Medical): No  Physical Activity: Inactive (10/24/2018)   Exercise Vital Sign    Days of Exercise per Week: 0 days    Minutes of Exercise per Session: 0 min  Stress: No Stress Concern Present (12/21/2021)   [employer] of Occupational Health - Occupational Stress Questionnaire    Feeling of Stress : Not at all  Social Connections: Unknown (12/21/2021)   Social Connection and Isolation Panel [NHANES]    Frequency of Communication with Friends and Family: More than three times a week    Frequency of Social Gatherings with Friends and Family: More than three times a week    Attends Religious Services: Not on Marketing executive or Organizations: Not on file    Attends Banker Meetings: Not on file    Marital Status: Not on file    Tobacco Counseling Counseling given: Not Answered   Clinical Intake:  Pre-visit preparation completed: Yes        Diabetes: No  How often do you need to have someone help you when you read instructions, pamphlets, or other written materials from your doctor or pharmacy?: 1 - Never    Interpreter Needed?: No      Activities of Daily Living    12/21/2021   11:43 AM  In your present state of health, do you have any difficulty performing the following activities:  Hearing? 0  Vision? 0   Difficulty concentrating or making decisions? 0  Walking or climbing stairs? 1  Comment Walker in use when ambulating. Pace yourself with activity.  Dressing or bathing? 0  Doing errands, shopping? 1  Preparing Food and eating ? N  Comment Staff assist with meal prep. Self feeds.  Using the Toilet? N  In the past six months, have you accidently leaked urine? N  Do you have problems with loss of bowel control? N  Managing your Medications? N  Managing your Finances? Y  Comment Son assist  Housekeeping or managing your  Housekeeping? N    Patient Care Team: Einar Pheasant, MD as PCP - General (Internal Medicine)  Indicate any recent Medical Services you may have received from other than [facility] providers in the past year (date may be approximate).     Assessment:   This is a routine wellness examination for [place].  Virtual Visit via Telephone Note  I connected with  Johnnette Barrios on 12/21/21 at 11:30 AM EDT by telephone and verified that I am speaking with the correct person using two identifiers.  Location: Patient: home Provider: office Persons participating in the virtual visit: patient/Nurse Health Advisor   I discussed the limitations of performing an evaluation and management service by telehealth. We continued and completed visit with audio only. Some vital signs may be absent or patient reported.     Hearing/Vision screen Hearing Screening - Comments:: Patient is able to hear conversational tones without difficulty. No issues reported.  Vision Screening - Comments:: Followed by [facility], Dr. George Ina Wears corrective lenses when reading Cataract extraction, bilateral  Glaucoma; drops in use They have seen their ophthalmologist in the last 6-12 months.   Dietary issues and exercise activities discussed: Current Exercise Habits: Home exercise routine, Type of exercise: walking, Intensity: Mild Regular diet  Good water intake   Goals Addressed              This Visit's Progress    Increase physical activity       Stretch and continue walking for exercise       Depression Screen    12/21/2021   11:43 AM 11/01/2021   10:37 AM 01/16/2021   12:38 PM 10/27/2020   11:28 AM 10/27/2019   11:43 AM 10/24/2018   12:17 PM 04/18/2017   10:03 AM  PHQ 2/9 Scores  PHQ - 2 Score 0 0 0 0 0 0 0    Fall Risk    12/21/2021   11:43 AM 11/01/2021   10:37 AM 01/16/2021   12:38 PM 10/27/2020   11:30 AM 10/27/2019   11:43 AM  Fall Risk   Falls in the past year? 0 0 1 0 0  Number falls in past yr: 0 0 0 0 0  Injury with Fall? 0 0 0 0   Risk for fall due to :  No Fall Risks Impaired balance/gait    Follow up Falls evaluation completed Falls evaluation completed Falls evaluation completed Falls evaluation completed Falls evaluation completed    [place]: Home free of loose throw rugs in walkways, pet beds, electrical cords, etc? Yes  Adequate lighting in your home to reduce risk of falls? Yes   ASSISTIVE DEVICES UTILIZED TO PREVENT FALLS: Life alert? Yes  Use of a cane, walker or w/c? Yes  Grab bars in the bathroom? Yes  Shower chair or bench in shower? Yes  Elevated toilet seat or a handicapped toilet? Yes   TIMED UP AND GO: Was the test performed? No .   Cognitive Function:    10/19/2016    2:30 PM  MMSE - Mini Mental State Exam  Orientation to time 5  Orientation to Place 5  Registration 3  Attention/ Calculation 5  Recall 3  Language- name 2 objects 2  Language- repeat 1  Language- follow 3 step command 3  Language- read & follow direction 1  Write a sentence 1  Copy design 1  Total score 30        12/21/2021  11:48 AM 10/27/2019   11:59 AM 10/24/2018   12:23 PM  6CIT Screen  What Year? 0 points 0 points 0 points  What month? 0 points 0 points 0 points  What time? 0 points 0 points 0 points  Count back from 20 0 points 0 points 0 points  Months in reverse 0 points 0 points 0 points   Repeat phrase 0 points 0 points 0 points  Total Score 0 points 0 points 0 points    Immunizations Immunization History  Administered Date(s) Administered   Influenza Split 01/14/2012, 12/15/2013   Influenza, High Dose Seasonal PF 01/04/2016, 12/07/2016, 02/14/2018   Influenza,inj,Quad PF,6+ Mos 02/24/2013, 03/15/2015   Influenza-Unspecified 01/08/2020   PFIZER(Purple Top)SARS-COV-2 Vaccination 04/15/2019, 05/13/2019, 01/09/2020, 08/24/2020   Pneumococcal Conjugate-13 01/10/2017   Pneumococcal Polysaccharide-23 02/14/2018   Covid-19 vaccine status: Completed vaccines x4.  Screening Tests Health Maintenance  Topic Date Due   TETANUS/TDAP  01/05/2022 (Originally 05/22/1944)   Zoster Vaccines- Shingrix (1 of 2) 01/05/2022 (Originally 05/22/1975)   COVID-19 Vaccine (5 - Pfizer series) 01/06/2022 (Originally 10/19/2020)   INFLUENZA VACCINE  07/08/2022 (Originally 11/07/2021)   Pneumonia Vaccine 36+ Years old  Completed   DEXA SCAN  Completed   HPV VACCINES  Aged Out   MAMMOGRAM  Discontinued   Health Maintenance There are no preventive care reminders to display for this patient.  Lung Cancer Screening: (Low Dose CT Chest recommended if Age 44-80 years, 30 pack-year currently smoking OR have quit w/in 15years.) does not qualify.   Hepatitis C Screening: does not qualify  Vision Screening: Recommended annual ophthalmology exams for early detection of glaucoma and other disorders of the eye.  Dental Screening: Recommended annual dental exams for proper oral hygiene  Community Resource Referral / Chronic Care Management: CRR required this visit?  No   CCM required this visit?  No      Plan:     I have personally reviewed and noted the following in the patient's chart:   Medical and social history Use of alcohol, tobacco or illicit drugs  Current medications and supplements including opioid prescriptions. Patient is not currently taking opioid prescriptions. Functional  ability and status Nutritional status Physical activity Advanced directives List of other physicians Hospitalizations, surgeries, and ER visits in previous 12 months Vitals Screenings to include cognitive, depression, and falls Referrals and appointments  In addition, I have reviewed and discussed with patient certain preventive protocols, quality metrics, and best practice recommendations. A written personalized care plan for preventive services as well as general preventive health recommendations were provided to patient.     Varney Biles, LPN   [id number]

## 2021-12-21 NOTE — Patient Instructions (Addendum)
Janet Moran , Thank you for taking time to come for your Medicare Wellness Visit. I appreciate your ongoing commitment to your health goals. Please review the following plan we discussed and let me know if I can assist you in the future.   These are the goals we discussed:  Goals       Patient Stated     Follow up with primary care provider (pt-stated)      As needed and keep routine visits       Other     Increase physical activity      Stretch and continue walking for exercise        This is a list of the screening recommended for you and due dates:  Health Maintenance  Topic Date Due   Tetanus Vaccine  01/05/2022*   Zoster (Shingles) Vaccine (1 of 2) 01/05/2022*   COVID-19 Vaccine (5 - Pfizer series) 01/06/2022*   Flu Shot  07/08/2022*   Pneumonia Vaccine  Completed   DEXA scan (bone density measurement)  Completed   HPV Vaccine  Aged Out   Mammogram  Discontinued  *Topic was postponed. The date shown is not the original due date.     Next appointment: Follow up in one year for your annual wellness visit    Preventive Care 65 Years and Older, Female Preventive care refers to lifestyle choices and visits with your health care provider that can promote health and wellness. What does preventive care include? A yearly physical exam. This is also called an annual well check. Dental exams once or twice a year. Routine eye exams. Ask your health care provider how often you should have your eyes checked. Personal lifestyle choices, including: Daily care of your teeth and gums. Regular physical activity. Eating a healthy diet. Avoiding tobacco and drug use. Limiting alcohol use. Practicing safe sex. Taking low-dose aspirin every day. Taking vitamin and mineral supplements as recommended by your health care provider. What happens during an annual well check? The services and screenings done by your health care provider during your annual well check will depend on your  age, overall health, lifestyle risk factors, and family history of disease. Counseling  Your health care provider may ask you questions about your: Alcohol use. Tobacco use. Drug use. Emotional well-being. Home and relationship well-being. Sexual activity. Eating habits. History of falls. Memory and ability to understand (cognition). Work and work Astronomer. Reproductive health. Screening  You may have the following tests or measurements: Height, weight, and BMI. Blood pressure. Lipid and cholesterol levels. These may be checked every 5 years, or more frequently if you are over 59 years old. Skin check. Lung cancer screening. You may have this screening every year starting at age 31 if you have a 30-pack-year history of smoking and currently smoke or have quit within the past 15 years. Fecal occult blood test (FOBT) of the stool. You may have this test every year starting at age 46. Flexible sigmoidoscopy or colonoscopy. You may have a sigmoidoscopy every 5 years or a colonoscopy every 10 years starting at age 65. Hepatitis C blood test. Hepatitis B blood test. Sexually transmitted disease (STD) testing. Diabetes screening. This is done by checking your blood sugar (glucose) after you have not eaten for a while (fasting). You may have this done every 1-3 years. Bone density scan. This is done to screen for osteoporosis. You may have this done starting at age 41. Mammogram. This may be done every 1-2 years. Talk to  your health care provider about how often you should have regular mammograms. Talk with your health care provider about your test results, treatment options, and if necessary, the need for more tests. Vaccines  Your health care provider may recommend certain vaccines, such as: Influenza vaccine. This is recommended every year. Tetanus, diphtheria, and acellular pertussis (Tdap, Td) vaccine. You may need a Td booster every 10 years. Zoster vaccine. You may need this after  age 32. Pneumococcal 13-valent conjugate (PCV13) vaccine. One dose is recommended after age 4. Pneumococcal polysaccharide (PPSV23) vaccine. One dose is recommended after age 12. Talk to your health care provider about which screenings and vaccines you need and how often you need them. This information is not intended to replace advice given to you by your health care provider. Make sure you discuss any questions you have with your health care provider. Document Released: 04/22/2015 Document Revised: 12/14/2015 Document Reviewed: 01/25/2015 Elsevier Interactive Patient Education  2017 [place] Prevention in the Home Falls can cause injuries. They can happen to people of all ages. There are many things you can do to make your home safe and to help prevent falls. What can I do on the outside of my home? Regularly fix the edges of walkways and driveways and fix any cracks. Remove anything that might make you trip as you walk through a door, such as a raised step or threshold. Trim any bushes or trees on the path to your home. Use bright outdoor lighting. Clear any walking paths of anything that might make someone trip, such as rocks or tools. Regularly check to see if handrails are loose or broken. Make sure that both sides of any steps have handrails. Any raised decks and porches should have guardrails on the edges. Have any leaves, snow, or ice cleared regularly. Use sand or salt on walking paths during winter. Clean up any spills in your garage right away. This includes oil or grease spills. What can I do in the bathroom? Use night lights. Install grab bars by the toilet and in the tub and shower. Do not use towel bars as grab bars. Use non-skid mats or decals in the tub or shower. If you need to sit down in the shower, use a plastic, non-slip stool. Keep the floor dry. Clean up any water that spills on the floor as soon as it happens. Remove soap buildup in the tub or shower  regularly. Attach bath mats securely with double-sided non-slip rug tape. Do not have throw rugs and other things on the floor that can make you trip. What can I do in the bedroom? Use night lights. Make sure that you have a light by your bed that is easy to reach. Do not use any sheets or blankets that are too big for your bed. They should not hang down onto the floor. Have a firm chair that has side arms. You can use this for support while you get dressed. Do not have throw rugs and other things on the floor that can make you trip. What can I do in the kitchen? Clean up any spills right away. Avoid walking on wet floors. Keep items that you use a lot in easy-to-reach places. If you need to reach something above you, use a strong step stool that has a grab bar. Keep electrical cords out of the way. Do not use floor polish or wax that makes floors slippery. If you must use wax, use non-skid floor wax. Do not  have throw rugs and other things on the floor that can make you trip. What can I do with my stairs? Do not leave any items on the stairs. Make sure that there are handrails on both sides of the stairs and use them. Fix handrails that are broken or loose. Make sure that handrails are as long as the stairways. Check any carpeting to make sure that it is firmly attached to the stairs. Fix any carpet that is loose or worn. Avoid having throw rugs at the top or bottom of the stairs. If you do have throw rugs, attach them to the floor with carpet tape. Make sure that you have a light switch at the top of the stairs and the bottom of the stairs. If you do not have them, ask someone to add them for you. What else can I do to help prevent falls? Wear shoes that: Do not have high heels. Have rubber bottoms. Are comfortable and fit you well. Are closed at the toe. Do not wear sandals. If you use a stepladder: Make sure that it is fully opened. Do not climb a closed stepladder. Make sure that  both sides of the stepladder are locked into place. Ask someone to hold it for you, if possible. Clearly mark and make sure that you can see: Any grab bars or handrails. First and last steps. Where the edge of each step is. Use tools that help you move around (mobility aids) if they are needed. These include: Canes. Walkers. Scooters. Crutches. Turn on the lights when you go into a dark area. Replace any light bulbs as soon as they burn out. Set up your furniture so you have a clear path. Avoid moving your furniture around. If any of your floors are uneven, fix them. If there are any pets around you, be aware of where they are. Review your medicines with your doctor. Some medicines can make you feel dizzy. This can increase your chance of falling. Ask your doctor what other things that you can do to help prevent falls. This information is not intended to replace advice given to you by your health care provider. Make sure you discuss any questions you have with your health care provider. Document Released: 01/20/2009 Document Revised: 09/01/2015 Document Reviewed: 04/30/2014 Elsevier Interactive Patient Education  2017 [employer].

## 2022-01-10 ENCOUNTER — Other Ambulatory Visit: Payer: Self-pay | Admitting: Internal Medicine

## 2022-01-10 DIAGNOSIS — I10 Essential (primary) hypertension: Secondary | ICD-10-CM

## 2022-02-01 ENCOUNTER — Other Ambulatory Visit: Payer: Self-pay | Admitting: Internal Medicine

## 2022-02-01 DIAGNOSIS — E7800 Pure hypercholesterolemia, unspecified: Secondary | ICD-10-CM

## 2022-02-08 ENCOUNTER — Other Ambulatory Visit: Payer: Self-pay | Admitting: Internal Medicine

## 2022-02-08 DIAGNOSIS — I10 Essential (primary) hypertension: Secondary | ICD-10-CM

## 2022-05-03 DIAGNOSIS — H43813 Vitreous degeneration, bilateral: Secondary | ICD-10-CM | POA: Diagnosis not present

## 2022-05-03 DIAGNOSIS — H353131 Nonexudative age-related macular degeneration, bilateral, early dry stage: Secondary | ICD-10-CM | POA: Diagnosis not present

## 2022-05-03 DIAGNOSIS — M3501 Sicca syndrome with keratoconjunctivitis: Secondary | ICD-10-CM | POA: Diagnosis not present

## 2022-05-03 DIAGNOSIS — H40053 Ocular hypertension, bilateral: Secondary | ICD-10-CM | POA: Diagnosis not present

## 2022-05-07 ENCOUNTER — Ambulatory Visit (INDEPENDENT_AMBULATORY_CARE_PROVIDER_SITE_OTHER): Payer: PPO | Admitting: Internal Medicine

## 2022-05-07 ENCOUNTER — Encounter: Payer: Self-pay | Admitting: Internal Medicine

## 2022-05-07 VITALS — BP 132/76 | HR 79 | Temp 98.0°F | Resp 15 | Ht 65.0 in | Wt 160.0 lb

## 2022-05-07 DIAGNOSIS — E7800 Pure hypercholesterolemia, unspecified: Secondary | ICD-10-CM | POA: Diagnosis not present

## 2022-05-07 DIAGNOSIS — H6123 Impacted cerumen, bilateral: Secondary | ICD-10-CM | POA: Diagnosis not present

## 2022-05-07 DIAGNOSIS — E871 Hypo-osmolality and hyponatremia: Secondary | ICD-10-CM | POA: Diagnosis not present

## 2022-05-07 DIAGNOSIS — I10 Essential (primary) hypertension: Secondary | ICD-10-CM | POA: Diagnosis not present

## 2022-05-07 DIAGNOSIS — I700 Atherosclerosis of aorta: Secondary | ICD-10-CM

## 2022-05-07 DIAGNOSIS — K219 Gastro-esophageal reflux disease without esophagitis: Secondary | ICD-10-CM

## 2022-05-07 DIAGNOSIS — M549 Dorsalgia, unspecified: Secondary | ICD-10-CM

## 2022-05-07 NOTE — Progress Notes (Signed)
Patient ID: Janet Moran, female   DOB: 12-05-1925, 89 y.o.   MRN: [id number]   Subjective:    Patient ID: Janet Moran, female    DOB: 04-29-1925, 89 y.o.   MRN: [id number]   Patient here for a scheduled follow up.   Chief Complaint  Patient presents with   Medical Management of Chronic Issues   Hypertension   .   Hypertension Pertinent negatives include no chest pain, headaches or palpitations.   Here to follow up regarding her blood pressure and cholesterol.  She is accompanied by her daughter Lovey Newcomer.  History obtained from both of them.  No chest pain.  Breathing stable.  Feeling better.  No abdominal pain.  Bowels moving.  Wants ears checked.  Concern regarding cerumen impaction.     Past Medical History:  Diagnosis Date   Arthritis    Chicken pox    GERD (gastroesophageal reflux disease)    Glaucoma    Hypercholesterolemia    Hypertension    Past Surgical History:  Procedure Laterality Date   ABDOMINAL HYSTERECTOMY  1976   TONSILLECTOMY AND ADENOIDECTOMY  1935   Family History  Problem Relation Age of Onset   Lung cancer Sister        died 58   Stroke Mother    Stroke Father    Diabetes Maternal Grandmother    Diabetes Maternal Grandfather    Breast cancer Maternal Aunt        50's   Breast cancer Daughter    Social History   Socioeconomic History   Marital status: Widowed    Spouse name: Not on file   Number of children: 5   Years of education: Not on file   Highest education level: Not on file  Occupational History   Not on file  Tobacco Use   Smoking status: Never   Smokeless tobacco: Never  Substance and Sexual Activity   Alcohol use: No    Alcohol/week: 0.0 standard drinks of alcohol   Drug use: No   Sexual activity: Never  Other Topics Concern   Not on file  Social History Narrative   Not on file   Social Determinants of Health   Financial Resource Strain: Low Risk  (12/21/2021)   Overall Financial Resource Strain (CARDIA)     Difficulty of Paying Living Expenses: Not hard at all  Food Insecurity: No Food Insecurity (12/21/2021)   Hunger Vital Sign    Worried About Running Out of Food in the Last Year: Never true    Ran Out of Food in the Last Year: Never true  Transportation Needs: No Transportation Needs (12/21/2021)   PRAPARE - Hydrologist (Medical): No    Lack of Transportation (Non-Medical): No  Physical Activity: Inactive (10/24/2018)   Exercise Vital Sign    Days of Exercise per Week: 0 days    Minutes of Exercise per Session: 0 min  Stress: No Stress Concern Present (12/21/2021)   [place]    Feeling of Stress : Not at all  Social Connections: Unknown (12/21/2021)   Social Connection and Isolation Panel [NHANES]    Frequency of Communication with Friends and Family: More than three times a week    Frequency of Social Gatherings with Friends and Family: More than three times a week    Attends Religious Services: Not on file    Active Member of Clubs or Organizations: Not on file  Attends Archivist Meetings: Not on file    Marital Status: Not on file     Review of Systems  Constitutional:  Negative for appetite change and unexpected weight change.  HENT:  Negative for congestion and sinus pressure.   Respiratory:  Negative for cough and chest tightness.        Breathing stable.   Cardiovascular:  Negative for chest pain, palpitations and leg swelling.  Gastrointestinal:  Negative for abdominal pain, diarrhea, nausea and vomiting.  Genitourinary:  Negative for difficulty urinating and dysuria.  Musculoskeletal:  Negative for joint swelling and myalgias.  Skin:  Negative for color change and rash.  Neurological:  Negative for dizziness, light-headedness and headaches.  Psychiatric/Behavioral:  Negative for agitation and dysphoric mood.        Objective:     BP 132/76   Pulse 79   Temp  98 F (36.7 C) (Temporal)   Resp 15   Ht 5' 5"$  (1.651 m)   Wt 160 lb (72.6 kg)   SpO2 98%   BMI 26.63 kg/m  Wt Readings from Last 3 Encounters:  05/07/22 160 lb (72.6 kg)  12/21/21 160 lb (72.6 kg)  11/01/21 160 lb (72.6 kg)    Physical Exam Vitals reviewed.  Constitutional:      General: She is not in acute distress.    Appearance: Normal appearance.  HENT:     Head: Normocephalic and atraumatic.     Right Ear: External ear normal.     Left Ear: External ear normal.  Eyes:     General: No scleral icterus.       Right eye: No discharge.        Left eye: No discharge.     Conjunctiva/sclera: Conjunctivae normal.  Neck:     Thyroid: No thyromegaly.  Cardiovascular:     Rate and Rhythm: Normal rate and regular rhythm.  Pulmonary:     Effort: No respiratory distress.     Breath sounds: Normal breath sounds. No wheezing.  Abdominal:     General: Bowel sounds are normal.     Palpations: Abdomen is soft.     Tenderness: There is no abdominal tenderness.  Musculoskeletal:        General: No swelling or tenderness.     Cervical back: Neck supple. No tenderness.  Lymphadenopathy:     Cervical: No cervical adenopathy.  Skin:    Findings: No erythema or rash.  Neurological:     Mental Status: She is alert.  Psychiatric:        Mood and Affect: Mood normal.        Behavior: Behavior normal.      Outpatient Encounter Medications as of 05/07/2022  Medication Sig   amLODipine (NORVASC) 2.5 MG tablet TAKE 1 TABLET BY MOUTH DAILY   aspirin EC 81 MG tablet Take 81 mg by mouth daily.   CALCIUM 500/D 500-400 MG-UNIT tablet SMARTSIG:By Mouth   Cholecalciferol (VITAMIN D3) 25 MCG (1000 UT) CAPS Take by mouth.   cyanocobalamin (,VITAMIN B-12,) 1000 MCG/ML injection Inject 75m (1,000 mcg) into the skin once a week for 4 weeks and then once every thirty days.   Cyanocobalamin (VITAMIN B-12) 3000 MCG SUBL Place under the tongue.   latanoprost (XALATAN) 0.005 % ophthalmic solution 1  drop at bedtime.   losartan (COZAAR) 50 MG tablet TAKE ONE TABLET BY MOUTH EVERY DAY   Multiple Vitamins-Minerals (PRESERVISION AREDS 2) CAPS Take 1 capsule by mouth 2 (two) times daily.  omeprazole (PRILOSEC) 20 MG capsule Take 20 mg by mouth daily.   simvastatin (ZOCOR) 20 MG tablet TAKE 1 TABLET BY MOUTH AT BEDTIME   [DISCONTINUED] azelastine (ASTELIN) 0.1 % nasal spray Place 1 spray into both nostrils 2 (two) times daily. Use in each nostril as directed   [DISCONTINUED] gabapentin (NEURONTIN) 100 MG capsule Take 1 capsule (100 mg total) by mouth at bedtime.   [DISCONTINUED] latanoprost (XALATAN) 0.005 % ophthalmic solution Place 1 drop into both eyes at bedtime.   [DISCONTINUED] naproxen sodium (ANAPROX) 220 MG tablet Take 220 mg by mouth 2 (two) times daily with a meal.   [DISCONTINUED] ZYRTEC ALLERGY 10 MG tablet Take by mouth.   No facility-administered encounter medications on file as of 05/07/2022.     Lab Results  Component Value Date   WBC 6.5 08/10/2021   HGB 14.2 08/10/2021   HCT 42.6 08/10/2021   PLT 203.0 08/10/2021   GLUCOSE 109 (H) 08/10/2021   CHOL 204 (H) 08/10/2021   TRIG 187.0 (H) 08/10/2021   HDL 60.10 08/10/2021   LDLDIRECT 95.0 01/16/2021   LDLCALC 107 (H) 08/10/2021   ALT 13 08/10/2021   AST 13 08/10/2021   NA 138 08/10/2021   K 4.3 08/10/2021   CL 104 08/10/2021   CREATININE 0.68 08/10/2021   BUN 15 08/10/2021   CO2 27 08/10/2021   TSH 1.98 01/16/2021       Assessment & Plan:   Problem List Items Addressed This Visit     Aortic atherosclerosis ([place])    Continue simvastatin.         Back pain    Stable.  Have discussed further w/up/evaluation.  Has declined. Wants to monitor.       Cerumen impaction    Bilateral cerumen impaction - left > right.  Debrox. Call for irrigation.       GERD (gastroesophageal reflux disease)    No upper symptoms.  Controlled on prilosec.       Hypercholesterolemia    Continue simvastatin.  Follow lipid  panel and liver function tests.        Relevant Orders   Lipid Profile   Hepatic function panel   TSH   Hypertension - Primary    On losartan and amlodipine.  Blood pressure doing well.  Follow pressures.  Follow metabolic panel.       Relevant Orders   Basic Metabolic Panel (BMET)   CBC w/Diff   Hyponatremia    Previous issue with decreased sodium.  Follow sodium level.         Einar Pheasant, MD

## 2022-05-20 ENCOUNTER — Encounter: Payer: Self-pay | Admitting: Internal Medicine

## 2022-05-20 NOTE — Assessment & Plan Note (Signed)
Continue simvastatin.  Follow lipid panel and liver function tests.

## 2022-05-20 NOTE — Assessment & Plan Note (Signed)
Continue simvastatin.

## 2022-05-20 NOTE — Assessment & Plan Note (Signed)
No upper symptoms.  Controlled on prilosec.

## 2022-05-20 NOTE — Assessment & Plan Note (Signed)
On losartan and amlodipine.  Blood pressure doing well.  Follow pressures.  Follow metabolic panel.

## 2022-05-20 NOTE — Assessment & Plan Note (Signed)
Stable.  Have discussed further w/up/evaluation.  Has declined. Wants to monitor.

## 2022-05-20 NOTE — Assessment & Plan Note (Signed)
Bilateral cerumen impaction - left > right.  Debrox. Call for irrigation.

## 2022-05-20 NOTE — Assessment & Plan Note (Signed)
Previous issue with decreased sodium.  Follow sodium level.

## 2022-07-10 ENCOUNTER — Other Ambulatory Visit: Payer: Self-pay | Admitting: Internal Medicine

## 2022-07-10 DIAGNOSIS — I10 Essential (primary) hypertension: Secondary | ICD-10-CM

## 2022-08-08 ENCOUNTER — Other Ambulatory Visit: Payer: Self-pay | Admitting: Internal Medicine

## 2022-08-08 DIAGNOSIS — E7800 Pure hypercholesterolemia, unspecified: Secondary | ICD-10-CM

## 2022-08-08 DIAGNOSIS — I10 Essential (primary) hypertension: Secondary | ICD-10-CM

## 2022-10-19 DIAGNOSIS — H353131 Nonexudative age-related macular degeneration, bilateral, early dry stage: Secondary | ICD-10-CM | POA: Diagnosis not present

## 2022-10-26 DIAGNOSIS — L814 Other melanin hyperpigmentation: Secondary | ICD-10-CM | POA: Diagnosis not present

## 2022-10-26 DIAGNOSIS — L820 Inflamed seborrheic keratosis: Secondary | ICD-10-CM | POA: Diagnosis not present

## 2022-10-26 DIAGNOSIS — L570 Actinic keratosis: Secondary | ICD-10-CM | POA: Diagnosis not present

## 2022-10-26 DIAGNOSIS — L821 Other seborrheic keratosis: Secondary | ICD-10-CM | POA: Diagnosis not present

## 2022-11-08 ENCOUNTER — Other Ambulatory Visit: Payer: Self-pay | Admitting: Internal Medicine

## 2022-11-08 DIAGNOSIS — I10 Essential (primary) hypertension: Secondary | ICD-10-CM

## 2022-12-26 ENCOUNTER — Ambulatory Visit (INDEPENDENT_AMBULATORY_CARE_PROVIDER_SITE_OTHER): Payer: PPO | Admitting: *Deleted

## 2022-12-26 VITALS — Ht 65.0 in | Wt 160.0 lb

## 2022-12-26 DIAGNOSIS — Z0000 Encounter for general adult medical examination without abnormal findings: Secondary | ICD-10-CM | POA: Diagnosis not present

## 2022-12-26 NOTE — Progress Notes (Signed)
Subjective:   Janet Moran is a 89 y.o. female who presents for Medicare Annual (Subsequent) preventive examination.  Visit Complete: Virtual  I connected with  Janet Moran on 12/26/22 by a audio enabled telemedicine application and verified that I am speaking with the correct person using two identifiers.  Patient Location: Home  Provider Location: Office/Clinic  I discussed the limitations of evaluation and management by telemedicine. The patient expressed understanding and agreed to proceed.  .Vital Signs: Unable to obtain new vitals due to this being a telehealth visit.  Cardiac Risk Factors include: advanced age (>26men, >72 women);dyslipidemia;hypertension     Objective:    Today's Vitals   12/26/22 1338  Weight: 160 lb (72.6 kg)  Height: 5\' 5"  (1.651 m)   Body mass index is 26.63 kg/m.     12/26/2022    1:58 PM 12/21/2021   12:16 PM 10/27/2020   11:30 AM 10/27/2019   11:42 AM 10/24/2018   12:15 PM 06/03/2018   12:56 PM 10/19/2016    2:08 PM  Advanced Directives  Does Patient Have a Medical Advance Directive? Yes Yes Yes Yes Yes Yes Yes  Type of Estate agent of [place];Living will Healthcare Power of [place];Living will Healthcare Power of [place];Living will Healthcare Power of [place];Living will Out of facility DNR (pink MOST or yellow form) Out of facility DNR (pink MOST or yellow form) Healthcare Power of [place];Living will  Does patient want to make changes to medical advance directive? No - Patient declined No - Patient declined No - Patient declined No - Patient declined   No - Patient declined  Copy of Healthcare Power of Attorney in Chart? Yes - validated most recent copy scanned in chart (See row information) Yes - validated most recent copy scanned in chart (See row information) Yes - validated most recent copy scanned in chart (See row information) Yes - validated most recent copy scanned in chart (See row information)        Current Medications (verified) Outpatient Encounter Medications as of 12/26/2022  Medication Sig   acetaminophen (TYLENOL) 650 MG CR tablet Take 650 mg by mouth every 8 (eight) hours as needed for pain.   amLODipine (NORVASC) 2.5 MG tablet TAKE 1 TABLET BY MOUTH DAILY   aspirin EC 81 MG tablet Take 81 mg by mouth daily.   CALCIUM 500/D 500-400 MG-UNIT tablet SMARTSIG:By Mouth   cetirizine (ZYRTEC) 10 MG chewable tablet Chew 10 mg by mouth daily.   Cholecalciferol (VITAMIN D3) 25 MCG (1000 UT) CAPS Take by mouth.   cyanocobalamin (,VITAMIN B-12,) 1000 MCG/ML injection Inject 1mL (1,000 mcg) into the skin once a week for 4 weeks and then once every thirty days.   Cyanocobalamin (VITAMIN B-12) 3000 MCG SUBL Place under the tongue.   latanoprost (XALATAN) 0.005 % ophthalmic solution 1 drop at bedtime.   losartan (COZAAR) 50 MG tablet TAKE ONE TABLET BY MOUTH EVERY DAY   Multiple Vitamins-Minerals (PRESERVISION AREDS 2) CAPS Take 1 capsule by mouth 2 (two) times daily.   omeprazole (PRILOSEC) 20 MG capsule Take 20 mg by mouth daily.   simvastatin (ZOCOR) 20 MG tablet TAKE 1 TABLET BY MOUTH AT BEDTIME   No facility-administered encounter medications on file as of 12/26/2022.    Allergies (verified) Avelox [moxifloxacin], Crestor [rosuvastatin], Pravachol [pravastatin], and Vytorin [ezetimibe-simvastatin]   History: Past Medical History:  Diagnosis Date   Arthritis    Chicken pox    GERD (gastroesophageal reflux disease)    Glaucoma  Hypercholesterolemia    Hypertension    Past Surgical History:  Procedure Laterality Date   ABDOMINAL HYSTERECTOMY  1976   TONSILLECTOMY AND ADENOIDECTOMY  1935   Family History  Problem Relation Age of Onset   Lung cancer Sister        died 62   Stroke Mother    Stroke Father    Diabetes Maternal Grandmother    Diabetes Maternal Grandfather    Breast cancer Maternal Aunt        57's   Breast cancer Daughter    Social History    Socioeconomic History   Marital status: Widowed    Spouse name: Not on file   Number of children: 5   Years of education: Not on file   Highest education level: Not on file  Occupational History   Not on file  Tobacco Use   Smoking status: Never   Smokeless tobacco: Never  Substance and Sexual Activity   Alcohol use: No    Alcohol/week: 0.0 standard drinks of alcohol   Drug use: No   Sexual activity: Never  Other Topics Concern   Not on file  Social History Narrative   widow   Social Determinants of Health   Financial Resource Strain: Low Risk  (12/26/2022)   Overall Financial Resource Strain (CARDIA)    Difficulty of Paying Living Expenses: Not hard at all  Food Insecurity: No Food Insecurity (12/26/2022)   Hunger Vital Sign    Worried About Running Out of Food in the Last Year: Never true    Ran Out of Food in the Last Year: Never true  Transportation Needs: No Transportation Needs (12/26/2022)   PRAPARE - Administrator, Civil Service (Medical): No    Lack of Transportation (Non-Medical): No  Physical Activity: Inactive (12/26/2022)   Exercise Vital Sign    Days of Exercise per Week: 0 days    Minutes of Exercise per Session: 0 min  Stress: No Stress Concern Present (12/26/2022)   [employer] of Occupational Health - Occupational Stress Questionnaire    Feeling of Stress : Not at all  Social Connections: Moderately Isolated (12/26/2022)   Social Connection and Isolation Panel [NHANES]    Frequency of Communication with Friends and Family: More than three times a week    Frequency of Social Gatherings with Friends and Family: More than three times a week    Attends Religious Services: Never    Database administrator or Organizations: Yes    Attends Engineer, structural: More than 4 times per year    Marital Status: Widowed    Tobacco Counseling Counseling given: Not Answered   Clinical Intake:  Pre-visit preparation completed:  Yes  Pain : No/denies pain     BMI - recorded: 26.63 Nutritional Status: BMI 25 -29 Overweight Nutritional Risks: None Diabetes: No  How often do you need to have someone help you when you read instructions, pamphlets, or other written materials from your doctor or pharmacy?: 1 - Never  Interpreter Needed?: No  Information entered by :: R.Tarae Wooden LPN   Activities of Daily Living    12/26/2022    1:41 PM  In your present state of health, do you have any difficulty performing the following activities:  Hearing? 0  Vision? 0  Comment glasses  Difficulty concentrating or making decisions? 1  Comment remembering names  Walking or climbing stairs? 1  Dressing or bathing? 0  Doing errands, shopping? 1  Comment family  Preparing Food and eating ? N  Using the Toilet? N  In the past six months, have you accidently leaked urine? Y  Comment wears pads  Do you have problems with loss of bowel control? N  Managing your Medications? N  Managing your Finances? Y  Housekeeping or managing your Housekeeping? Y    Patient Care Team: Dale Coffey, MD as PCP - General (Internal Medicine)  Indicate any recent Medical Services you may have received from other than [facility] providers in the past year (date may be approximate).     Assessment:   This is a routine wellness examination for [place].  Hearing/Vision screen Hearing Screening - Comments:: No issues Vision Screening - Comments:: glasses   Goals Addressed             This Visit's Progress    Patient Stated       Continue to get out and stay active       Depression Screen    12/26/2022    1:52 PM 05/07/2022    2:01 PM 12/21/2021   11:43 AM 11/01/2021   10:37 AM 01/16/2021   12:38 PM 10/27/2020   11:28 AM 10/27/2019   11:43 AM  PHQ 2/9 Scores  PHQ - 2 Score 0 0 0 0 0 0 0  PHQ- 9 Score 3 1         Fall Risk    12/26/2022    1:45 PM 05/07/2022    2:01 PM 12/21/2021   11:43 AM 11/01/2021   10:37 AM 01/16/2021    12:38 PM  Fall Risk   Falls in the past year? 0 0 0 0 1  Number falls in past yr: 0 0 0 0 0  Injury with Fall? 0 0 0 0 0  Risk for fall due to : No Fall Risks No Fall Risks  No Fall Risks Impaired balance/gait  Follow up Falls prevention discussed;Falls evaluation completed Falls evaluation completed Falls evaluation completed Falls evaluation completed Falls evaluation completed    MEDICARE RISK AT HOME: Medicare Risk at Home Any stairs in or around the home?: No If so, are there any without handrails?: No Home free of loose throw rugs in walkways, pet beds, electrical cords, etc?: Yes Adequate lighting in your home to reduce risk of falls?: Yes Life alert?: Yes Use of a cane, walker or w/c?: Yes (walker at times) Grab bars in the bathroom?: Yes Shower chair or bench in shower?: No Elevated toilet seat or a handicapped toilet?: No      Cognitive Function:    10/19/2016    2:30 PM  MMSE - Mini Mental State Exam  Orientation to time 5  Orientation to Place 5  Registration 3  Attention/ Calculation 5  Recall 3  Language- name 2 objects 2  Language- repeat 1  Language- follow 3 step command 3  Language- read & follow direction 1  Write a sentence 1  Copy design 1  Total score 30        12/26/2022    1:58 PM 12/21/2021   11:48 AM 10/27/2019   11:59 AM 10/24/2018   12:23 PM  6CIT Screen  What Year? 0 points 0 points 0 points 0 points  What month? 0 points 0 points 0 points 0 points  What time? 0 points 0 points 0 points 0 points  Count back from 20 0 points 0 points 0 points 0 points  Months in reverse 0 points 0 points 0 points 0 points  Repeat phrase 6 points 0 points 0 points 0 points  Total Score 6 points 0 points 0 points 0 points    Immunizations Immunization History  Administered Date(s) Administered   Influenza Split 01/14/2012, 12/15/2013   Influenza, High Dose Seasonal PF 01/04/2016, 12/07/2016, 02/14/2018   Influenza,inj,Quad PF,6+ Mos 02/24/2013,  03/15/2015   Influenza-Unspecified 01/08/2020   PFIZER(Purple Top)SARS-COV-2 Vaccination 04/15/2019, 05/13/2019, 01/09/2020, 08/24/2020   Pneumococcal Conjugate-13 01/10/2017   Pneumococcal Polysaccharide-23 02/14/2018    TDAP status: Due, Education has been provided regarding the importance of this vaccine. Advised may receive this vaccine at local pharmacy or Health Dept. Aware to provide a copy of the vaccination record if obtained from local pharmacy or Health Dept. Verbalized acceptance and understanding.  Flu Vaccine status: Due, Education has been provided regarding the importance of this vaccine. Advised may receive this vaccine at local pharmacy or Health Dept. Aware to provide a copy of the vaccination record if obtained from local pharmacy or Health Dept. Verbalized acceptance and understanding.  Pneumococcal vaccine status: Up to date  Covid-19 vaccine status: Information provided on how to obtain vaccines.   Qualifies for Shingles Vaccine? Yes   Zostavax completed No   Shingrix Completed?: No.    Education has been provided regarding the importance of this vaccine. Patient has been advised to call insurance company to determine out of pocket expense if they have not yet received this vaccine. Advised may also receive vaccine at local pharmacy or Health Dept. Verbalized acceptance and understanding.  Screening Tests Health Maintenance  Topic Date Due   Zoster Vaccines- Shingrix (1 of 2) Never done   INFLUENZA VACCINE  11/08/2022   COVID-19 Vaccine (5 - 2023-24 season) 12/09/2022   Medicare Annual Wellness (AWV)  12/22/2022   Pneumonia Vaccine 7+ Years old  Completed   DEXA SCAN  Completed   HPV VACCINES  Aged Out   DTaP/Tdap/Td  Discontinued   MAMMOGRAM  Discontinued    Health Maintenance  Health Maintenance Due  Topic Date Due   Zoster Vaccines- Shingrix (1 of 2) Never done   INFLUENZA VACCINE  11/08/2022   COVID-19 Vaccine (5 - 2023-24 season) 12/09/2022    Medicare Annual Wellness (AWV)  12/22/2022    Colorectal cancer screening: No longer required.   Mammogram status: No longer required due to age.  Bone Density status: Completed 04/2015. Results reflect: Bone density results: OSTEOPOROSIS. Repeat every 2 years. Patient declines because of her age  Lung Cancer Screening: (Low Dose CT Chest recommended if Age 80-80 years, 20 pack-year currently smoking OR have quit w/in 15years.) does not qualify.     Additional Screening:  Hepatitis C Screening: does not qualify; Completed NA age  Vision Screening: Recommended annual ophthalmology exams for early detection of glaucoma and other disorders of the eye. Is the patient up to date with their annual eye exam?  Yes  Who is the provider or what is the name of the office in which the patient attends annual eye exams? [facility] If pt is not established with a provider, would they like to be referred to a provider to establish care? No .   Dental Screening: Recommended annual dental exams for proper oral hygiene    Community Resource Referral / Chronic Care Management: CRR required this visit?  No   CCM required this visit?  No     Plan:     I have personally reviewed and noted the following in the patient's chart:   Medical and social history  Use of alcohol, tobacco or illicit drugs  Current medications and supplements including opioid prescriptions. Patient is not currently taking opioid prescriptions. Functional ability and status Nutritional status Physical activity Advanced directives List of other physicians Hospitalizations, surgeries, and ER visits in previous 12 months Vitals Screenings to include cognitive, depression, and falls Referrals and appointments  In addition, I have reviewed and discussed with patient certain preventive protocols, quality metrics, and best practice recommendations. A written personalized care plan for preventive services as well as general  preventive health recommendations were provided to patient.     Sydell Axon, LPN   [id number]/[id number]   After Visit Summary: (MyChart) Due to this being a telephonic visit, the after visit summary with patients personalized plan was offered to patient via MyChart   Nurse Notes: None

## 2022-12-26 NOTE — Patient Instructions (Signed)
Janet Moran , Thank you for taking time to come for your Medicare Wellness Visit. I appreciate your ongoing commitment to your health goals. Please review the following plan we discussed and let me know if I can assist you in the future.   Referrals/Orders/Follow-Ups/Clinician Recommendations: Get Updated Flu shot and Covid vaccines  This is a list of the screening recommended for you and due dates:  Health Maintenance  Topic Date Due   Zoster (Shingles) Vaccine (1 of 2) Never done   Flu Shot  11/08/2022   COVID-19 Vaccine (5 - 2023-24 season) 12/09/2022   Medicare Annual Wellness Visit  12/26/2023   Pneumonia Vaccine  Completed   DEXA scan (bone density measurement)  Completed   HPV Vaccine  Aged Out   DTaP/Tdap/Td vaccine  Discontinued   Mammogram  Discontinued    Advanced directives: (In Chart) A copy of your advanced directives are scanned into your chart should your provider ever need it.  Next Medicare Annual Wellness Visit scheduled for next year: Yes 01/01/24 @ 1:15

## 2023-02-07 DIAGNOSIS — L821 Other seborrheic keratosis: Secondary | ICD-10-CM | POA: Diagnosis not present

## 2023-02-07 DIAGNOSIS — L820 Inflamed seborrheic keratosis: Secondary | ICD-10-CM | POA: Diagnosis not present

## 2023-02-07 DIAGNOSIS — L814 Other melanin hyperpigmentation: Secondary | ICD-10-CM | POA: Diagnosis not present

## 2023-02-07 DIAGNOSIS — L570 Actinic keratosis: Secondary | ICD-10-CM | POA: Diagnosis not present

## 2023-02-11 ENCOUNTER — Other Ambulatory Visit: Payer: Self-pay | Admitting: Internal Medicine

## 2023-02-11 DIAGNOSIS — E7800 Pure hypercholesterolemia, unspecified: Secondary | ICD-10-CM

## 2023-02-11 DIAGNOSIS — I10 Essential (primary) hypertension: Secondary | ICD-10-CM

## 2023-05-09 ENCOUNTER — Ambulatory Visit (INDEPENDENT_AMBULATORY_CARE_PROVIDER_SITE_OTHER): Payer: PPO | Admitting: Internal Medicine

## 2023-05-09 VITALS — BP 136/74 | HR 88 | Temp 98.2°F | Resp 16 | Ht 65.0 in | Wt 155.0 lb

## 2023-05-09 DIAGNOSIS — E7800 Pure hypercholesterolemia, unspecified: Secondary | ICD-10-CM | POA: Diagnosis not present

## 2023-05-09 DIAGNOSIS — K219 Gastro-esophageal reflux disease without esophagitis: Secondary | ICD-10-CM

## 2023-05-09 DIAGNOSIS — M549 Dorsalgia, unspecified: Secondary | ICD-10-CM | POA: Diagnosis not present

## 2023-05-09 DIAGNOSIS — I700 Atherosclerosis of aorta: Secondary | ICD-10-CM

## 2023-05-09 DIAGNOSIS — I10 Essential (primary) hypertension: Secondary | ICD-10-CM | POA: Diagnosis not present

## 2023-05-09 DIAGNOSIS — M810 Age-related osteoporosis without current pathological fracture: Secondary | ICD-10-CM | POA: Diagnosis not present

## 2023-05-09 NOTE — Progress Notes (Signed)
Subjective:    Patient ID: Janet Moran, female    DOB: 12/20/1925, 89 y.o.   MRN: [id number]  Patient here for  Chief Complaint  Patient presents with   Annual Exam    HPI Here for follow up appt - Here to follow up regarding her blood pressure and cholesterol. She reports she is doing relatively well. Still some back pain, but states once she is up and moving, this is better. Denies any chest pain. Feels her breathing is stable. No increased congestion. No abdominal pain or bowel change reported.    Past Medical History:  Diagnosis Date   Arthritis    Chicken pox    GERD (gastroesophageal reflux disease)    Glaucoma    Hypercholesterolemia    Hypertension    Past Surgical History:  Procedure Laterality Date   ABDOMINAL HYSTERECTOMY  1976   TONSILLECTOMY AND ADENOIDECTOMY  1935   Family History  Problem Relation Age of Onset   Lung cancer Sister        died 57   Stroke Mother    Stroke Father    Diabetes Maternal Grandmother    Diabetes Maternal Grandfather    Breast cancer Maternal Aunt        67's   Breast cancer Daughter    Social History   Socioeconomic History   Marital status: Widowed    Spouse name: Not on file   Number of children: 5   Years of education: Not on file   Highest education level: Not on file  Occupational History   Not on file  Tobacco Use   Smoking status: Never   Smokeless tobacco: Never  Substance and Sexual Activity   Alcohol use: No    Alcohol/week: 0.0 standard drinks of alcohol   Drug use: No   Sexual activity: Never  Other Topics Concern   Not on file  Social History Narrative   widow   Social Drivers of Health   Financial Resource Strain: Low Risk  (12/26/2022)   Overall Financial Resource Strain (CARDIA)    Difficulty of Paying Living Expenses: Not hard at all  Food Insecurity: No Food Insecurity (12/26/2022)   Hunger Vital Sign    Worried About Running Out of Food in the Last Year: Never true    Ran Out of  Food in the Last Year: Never true  Transportation Needs: No Transportation Needs (12/26/2022)   PRAPARE - Administrator, Civil Service (Medical): No    Lack of Transportation (Non-Medical): No  Physical Activity: Inactive (12/26/2022)   Exercise Vital Sign    Days of Exercise per Week: 0 days    Minutes of Exercise per Session: 0 min  Stress: No Stress Concern Present (12/26/2022)   [employer] of Occupational Health - Occupational Stress Questionnaire    Feeling of Stress : Not at all  Social Connections: Moderately Isolated (12/26/2022)   Social Connection and Isolation Panel [NHANES]    Frequency of Communication with Friends and Family: More than three times a week    Frequency of Social Gatherings with Friends and Family: More than three times a week    Attends Religious Services: Never    Database administrator or Organizations: Yes    Attends Engineer, structural: More than 4 times per year    Marital Status: Widowed     Review of Systems  Constitutional:  Negative for appetite change and unexpected weight change.  HENT:  Negative for  congestion and sinus pressure.   Respiratory:  Negative for cough, chest tightness and shortness of breath.   Cardiovascular:  Negative for chest pain and palpitations.  Gastrointestinal:  Negative for abdominal pain, diarrhea, nausea and vomiting.  Genitourinary:  Negative for difficulty urinating and dysuria.  Musculoskeletal:  Positive for back pain. Negative for joint swelling and myalgias.  Skin:  Negative for color change and rash.  Neurological:  Negative for dizziness and headaches.  Psychiatric/Behavioral:  Negative for agitation and dysphoric mood.        Objective:     BP 136/74   Pulse 88   Temp 98.2 F (36.8 C)   Resp 16   Ht 5\' 5"  (1.651 m)   Wt 155 lb (70.3 kg)   SpO2 98%   BMI 25.79 kg/m  Wt Readings from Last 3 Encounters:  05/09/23 155 lb (70.3 kg)  12/26/22 160 lb (72.6 kg)  05/07/22  160 lb (72.6 kg)    Physical Exam Vitals reviewed.  Constitutional:      General: She is not in acute distress.    Appearance: Normal appearance.  HENT:     Head: Normocephalic and atraumatic.     Right Ear: External ear normal.     Left Ear: External ear normal.     Mouth/Throat:     Pharynx: No oropharyngeal exudate or posterior oropharyngeal erythema.  Eyes:     General: No scleral icterus.       Right eye: No discharge.        Left eye: No discharge.     Conjunctiva/sclera: Conjunctivae normal.  Neck:     Thyroid: No thyromegaly.  Cardiovascular:     Rate and Rhythm: Normal rate and regular rhythm.  Pulmonary:     Effort: No respiratory distress.     Breath sounds: Normal breath sounds. No wheezing.  Abdominal:     General: Bowel sounds are normal.     Palpations: Abdomen is soft.     Tenderness: There is no abdominal tenderness.  Musculoskeletal:        General: No swelling or tenderness.     Cervical back: Neck supple. No tenderness.  Lymphadenopathy:     Cervical: No cervical adenopathy.  Skin:    Findings: No erythema or rash.  Neurological:     Mental Status: She is alert.  Psychiatric:        Mood and Affect: Mood normal.        Behavior: Behavior normal.         Outpatient Encounter Medications as of 05/09/2023  Medication Sig   acetaminophen (TYLENOL) 650 MG CR tablet Take 650 mg by mouth every 8 (eight) hours as needed for pain.   amLODipine (NORVASC) 2.5 MG tablet TAKE 1 TABLET BY MOUTH DAILY   aspirin EC 81 MG tablet Take 81 mg by mouth daily.   CALCIUM 500/D 500-400 MG-UNIT tablet SMARTSIG:By Mouth   cetirizine (ZYRTEC) 10 MG chewable tablet Chew 10 mg by mouth daily.   Cholecalciferol (VITAMIN D3) 25 MCG (1000 UT) CAPS Take by mouth.   Cyanocobalamin (VITAMIN B-12) 3000 MCG SUBL Place under the tongue.   latanoprost (XALATAN) 0.005 % ophthalmic solution 1 drop at bedtime.   losartan (COZAAR) 50 MG tablet TAKE ONE TABLET BY MOUTH EVERY DAY    Multiple Vitamins-Minerals (PRESERVISION AREDS 2) CAPS Take 1 capsule by mouth 2 (two) times daily.   omeprazole (PRILOSEC) 20 MG capsule Take 20 mg by mouth daily.   simvastatin (ZOCOR) 20  MG tablet TAKE 1 TABLET BY MOUTH AT BEDTIME   [DISCONTINUED] cyanocobalamin (,VITAMIN B-12,) 1000 MCG/ML injection Inject 1mL (1,000 mcg) into the skin once a week for 4 weeks and then once every thirty days.   No facility-administered encounter medications on file as of 05/09/2023.     Lab Results  Component Value Date   WBC 7.5 05/09/2023   HGB 14.4 05/09/2023   HCT 42.8 05/09/2023   PLT 202.0 05/09/2023   GLUCOSE 101 (H) 05/09/2023   CHOL 177 05/09/2023   TRIG 271.0 (H) 05/09/2023   HDL 50.70 05/09/2023   LDLDIRECT 95.0 01/16/2021   LDLCALC 72 05/09/2023   ALT 7 05/09/2023   AST 11 05/09/2023   NA 139 05/09/2023   K 3.8 05/09/2023   CL 104 05/09/2023   CREATININE 0.65 05/09/2023   BUN 8 05/09/2023   CO2 25 05/09/2023   TSH 1.97 05/09/2023       Assessment & Plan:  Primary hypertension Assessment & Plan: On losartan and amlodipine.  Blood pressure doing well.  Follow pressures.  Follow metabolic panel.   Orders: -     Basic metabolic panel  Hypercholesterolemia Assessment & Plan: Continue simvastatin.  Follow lipid panel and liver function tests.    Orders: -     Hepatic function panel -     CBC with Differential/Platelet -     TSH -     Lipid panel  Aortic atherosclerosis (HCC) Assessment & Plan: Continue simvastatin.      Back pain, unspecified back location, unspecified back pain laterality, unspecified chronicity Assessment & Plan: Stable.  Have discussed further w/up/evaluation.  Has declined. Wants to monitor.    Gastroesophageal reflux disease, unspecified whether esophagitis present Assessment & Plan: No upper symptoms.  Controlled on prilosec.    Osteoporosis, unspecified osteoporosis type, unspecified pathological fracture presence Assessment &  Plan: Have discussed treatment options.  She prefers not to start any prescription medication.  Check vitamin D level.        Dale Paisley, MD

## 2023-05-10 LAB — LIPID PANEL
Cholesterol: 177 mg/dL (ref 0–200)
HDL: 50.7 mg/dL (ref >39.00)
LDL Cholesterol: 72 mg/dL (ref 0–99)
NonHDL: 126.46
Total CHOL/HDL Ratio: 3
Triglycerides: 271 mg/dL — ABNORMAL HIGH (ref 0.0–149.0)
VLDL: 54.2 mg/dL — ABNORMAL HIGH (ref 0.0–40.0)

## 2023-05-10 LAB — CBC WITH DIFFERENTIAL/PLATELET
Basophils Absolute: 0.1 10*3/uL (ref 0.0–0.1)
Basophils Relative: 1 % (ref 0.0–3.0)
Eosinophils Absolute: 0.2 10*3/uL (ref 0.0–0.7)
Eosinophils Relative: 2.8 % (ref 0.0–5.0)
HCT: 42.8 % (ref 36.0–46.0)
Hemoglobin: 14.4 g/dL (ref 12.0–15.0)
Lymphocytes Relative: 16 % (ref 12.0–46.0)
Lymphs Abs: 1.2 10*3/uL (ref 0.7–4.0)
MCHC: 33.6 g/dL (ref 30.0–36.0)
MCV: 94 fL (ref 78.0–100.0)
Monocytes Absolute: 0.6 10*3/uL (ref 0.1–1.0)
Monocytes Relative: 8.1 % (ref 3.0–12.0)
Neutro Abs: 5.4 10*3/uL (ref 1.4–7.7)
Neutrophils Relative %: 72.1 % (ref 43.0–77.0)
Platelets: 202 10*3/uL (ref 150.0–400.0)
RBC: 4.55 Mil/uL (ref 3.87–5.11)
RDW: 14 % (ref 11.5–15.5)
WBC: 7.5 10*3/uL (ref 4.0–10.5)

## 2023-05-10 LAB — BASIC METABOLIC PANEL
BUN: 8 mg/dL (ref 6–23)
CO2: 25 meq/L (ref 19–32)
Calcium: 9.5 mg/dL (ref 8.4–10.5)
Chloride: 104 meq/L (ref 96–112)
Creatinine, Ser: 0.65 mg/dL (ref 0.40–1.20)
GFR: 74.55 mL/min (ref >60.00)
Glucose, Bld: 101 mg/dL — ABNORMAL HIGH (ref 70–99)
Potassium: 3.8 meq/L (ref 3.5–5.1)
Sodium: 139 meq/L (ref 135–145)

## 2023-05-10 LAB — TSH: TSH: 1.97 u[IU]/mL (ref 0.35–5.50)

## 2023-05-10 LAB — HEPATIC FUNCTION PANEL
ALT: 7 U/L (ref 0–35)
AST: 11 U/L (ref 0–37)
Albumin: 3.9 g/dL (ref 3.5–5.2)
Alkaline Phosphatase: 71 U/L (ref 39–117)
Bilirubin, Direct: 0.1 mg/dL (ref 0.0–0.3)
Total Bilirubin: 0.4 mg/dL (ref 0.2–1.2)
Total Protein: 6 g/dL (ref 6.0–8.3)

## 2023-05-11 ENCOUNTER — Encounter: Payer: Self-pay | Admitting: Internal Medicine

## 2023-05-11 NOTE — Assessment & Plan Note (Signed)
No upper symptoms.  Controlled on prilosec.  

## 2023-05-11 NOTE — Assessment & Plan Note (Signed)
Stable.  Have discussed further w/up/evaluation.  Has declined. Wants to monitor.  

## 2023-05-11 NOTE — Assessment & Plan Note (Signed)
On losartan and amlodipine.  Blood pressure doing well.  Follow pressures.  Follow metabolic panel.  

## 2023-05-11 NOTE — Assessment & Plan Note (Signed)
Continue simvastatin.  Follow lipid panel and liver function tests.   

## 2023-05-11 NOTE — Assessment & Plan Note (Signed)
 Continue simvastatin.

## 2023-05-11 NOTE — Assessment & Plan Note (Signed)
Have discussed treatment options.  She prefers not to start any prescription medication.  Check vitamin D level.

## 2023-06-13 DIAGNOSIS — L821 Other seborrheic keratosis: Secondary | ICD-10-CM | POA: Diagnosis not present

## 2023-06-13 DIAGNOSIS — L814 Other melanin hyperpigmentation: Secondary | ICD-10-CM | POA: Diagnosis not present

## 2023-06-13 DIAGNOSIS — L570 Actinic keratosis: Secondary | ICD-10-CM | POA: Diagnosis not present

## 2023-06-24 DIAGNOSIS — M3501 Sicca syndrome with keratoconjunctivitis: Secondary | ICD-10-CM | POA: Diagnosis not present

## 2023-06-24 DIAGNOSIS — H40053 Ocular hypertension, bilateral: Secondary | ICD-10-CM | POA: Diagnosis not present

## 2023-06-24 DIAGNOSIS — H43813 Vitreous degeneration, bilateral: Secondary | ICD-10-CM | POA: Diagnosis not present

## 2023-06-24 DIAGNOSIS — H353131 Nonexudative age-related macular degeneration, bilateral, early dry stage: Secondary | ICD-10-CM | POA: Diagnosis not present

## 2023-07-11 DIAGNOSIS — L821 Other seborrheic keratosis: Secondary | ICD-10-CM | POA: Diagnosis not present

## 2023-07-11 DIAGNOSIS — L814 Other melanin hyperpigmentation: Secondary | ICD-10-CM | POA: Diagnosis not present

## 2023-07-11 DIAGNOSIS — L570 Actinic keratosis: Secondary | ICD-10-CM | POA: Diagnosis not present

## 2023-07-11 DIAGNOSIS — L820 Inflamed seborrheic keratosis: Secondary | ICD-10-CM | POA: Diagnosis not present

## 2023-08-08 DIAGNOSIS — L821 Other seborrheic keratosis: Secondary | ICD-10-CM | POA: Diagnosis not present

## 2023-08-08 DIAGNOSIS — L820 Inflamed seborrheic keratosis: Secondary | ICD-10-CM | POA: Diagnosis not present

## 2023-08-08 DIAGNOSIS — L814 Other melanin hyperpigmentation: Secondary | ICD-10-CM | POA: Diagnosis not present

## 2023-08-08 DIAGNOSIS — L570 Actinic keratosis: Secondary | ICD-10-CM | POA: Diagnosis not present

## 2023-08-14 ENCOUNTER — Other Ambulatory Visit: Payer: Self-pay | Admitting: Internal Medicine

## 2023-08-14 DIAGNOSIS — I10 Essential (primary) hypertension: Secondary | ICD-10-CM

## 2023-11-19 ENCOUNTER — Other Ambulatory Visit: Payer: Self-pay | Admitting: Internal Medicine

## 2023-11-19 DIAGNOSIS — I10 Essential (primary) hypertension: Secondary | ICD-10-CM

## 2023-11-19 DIAGNOSIS — E7800 Pure hypercholesterolemia, unspecified: Secondary | ICD-10-CM

## 2023-12-23 DIAGNOSIS — M3501 Sicca syndrome with keratoconjunctivitis: Secondary | ICD-10-CM | POA: Diagnosis not present

## 2023-12-23 DIAGNOSIS — H40053 Ocular hypertension, bilateral: Secondary | ICD-10-CM | POA: Diagnosis not present

## 2023-12-23 DIAGNOSIS — H353131 Nonexudative age-related macular degeneration, bilateral, early dry stage: Secondary | ICD-10-CM | POA: Diagnosis not present

## 2023-12-23 DIAGNOSIS — H43813 Vitreous degeneration, bilateral: Secondary | ICD-10-CM | POA: Diagnosis not present

## 2024-01-01 ENCOUNTER — Ambulatory Visit: Payer: PPO | Admitting: *Deleted

## 2024-01-01 VITALS — Ht 65.0 in | Wt 155.0 lb

## 2024-01-01 DIAGNOSIS — Z0000 Encounter for general adult medical examination without abnormal findings: Secondary | ICD-10-CM | POA: Diagnosis not present

## 2024-01-01 NOTE — Progress Notes (Signed)
 Subjective:   Janet Moran is a 89 y.o. who presents for a Medicare Wellness preventive visit.  As a reminder, Annual Wellness Visits don't include a physical exam, and some assessments may be limited, especially if this visit is performed virtually. We may recommend an in-person follow-up visit with your provider if needed.  Visit Complete: Virtual I connected with  Janet Moran on 01/01/24 by a audio enabled telemedicine application and verified that I am speaking with the correct person using two identifiers.  Patient Location: Home  Provider Location: Home Office  I discussed the limitations of evaluation and management by telemedicine. The patient expressed understanding and agreed to proceed.  Vital Signs: Because this visit was a virtual/telehealth visit, some criteria may be missing or patient reported. Any vitals not documented were not able to be obtained and vitals that have been documented are patient reported.  VideoDeclined- This patient declined Librarian, academic. Therefore the visit was completed with audio only.  Persons Participating in Visit: Patient.  AWV Questionnaire: No: Patient Medicare AWV questionnaire was not completed prior to this visit.  Cardiac Risk Factors include: advanced age (>65men, >50 women);dyslipidemia;hypertension     Objective:    Today's Vitals   01/01/24 1325  Weight: 155 lb (70.3 kg)  Height: 5' 5 (1.651 m)   Body mass index is 25.79 kg/m.     01/01/2024    1:41 PM 12/26/2022    1:58 PM 12/21/2021   12:16 PM 10/27/2020   11:30 AM 10/27/2019   11:42 AM 10/24/2018   12:15 PM 06/03/2018   12:56 PM  Advanced Directives  Does Patient Have a Medical Advance Directive? Yes Yes Yes Yes Yes Yes Yes   Type of Estate agent of [place];Living will Healthcare Power of [place];Living will Healthcare Power of [place];Living will Healthcare Power of [place];Living will Healthcare Power  of [place];Living will Out of facility DNR (pink MOST or yellow form) Out of facility DNR (pink MOST or yellow form)  Does patient want to make changes to medical advance directive? No - Patient declined No - Patient declined No - Patient declined No - Patient declined No - Patient declined    Copy of Healthcare Power of Attorney in Chart? Yes - validated most recent copy scanned in chart (See row information) Yes - validated most recent copy scanned in chart (See row information) Yes - validated most recent copy scanned in chart (See row information) Yes - validated most recent copy scanned in chart (See row information) Yes - validated most recent copy scanned in chart (See row information)       Data saved with a previous flowsheet row definition    Current Medications (verified) Outpatient Encounter Medications as of 01/01/2024  Medication Sig   acetaminophen (TYLENOL) 650 MG CR tablet Take 650 mg by mouth every 8 (eight) hours as needed for pain.   amLODipine  (NORVASC ) 2.5 MG tablet TAKE 1 TABLET BY MOUTH DAILY   aspirin EC 81 MG tablet Take 81 mg by mouth daily.   CALCIUM 500/D 500-400 MG-UNIT tablet SMARTSIG:By Mouth   cetirizine (ZYRTEC) 10 MG chewable tablet Chew 10 mg by mouth daily.   Cholecalciferol (VITAMIN D3) 25 MCG (1000 UT) CAPS Take by mouth.   Cyanocobalamin  (VITAMIN B-12) 3000 MCG SUBL Place under the tongue.   latanoprost (XALATAN) 0.005 % ophthalmic solution 1 drop at bedtime.   losartan  (COZAAR ) 50 MG tablet TAKE ONE TABLET BY MOUTH ONCE DAILY   Multiple  Vitamins-Minerals (PRESERVISION AREDS 2) CAPS Take 1 capsule by mouth 2 (two) times daily.   omeprazole (PRILOSEC) 20 MG capsule Take 20 mg by mouth daily.   simvastatin  (ZOCOR ) 20 MG tablet TAKE 1 TABLET BY MOUTH AT BEDTIME   No facility-administered encounter medications on file as of 01/01/2024.    Allergies (verified) Avelox [moxifloxacin], Crestor [rosuvastatin], Pravachol [pravastatin], and Vytorin  [ezetimibe-simvastatin ]   History: Past Medical History:  Diagnosis Date   Arthritis    Chicken pox    GERD (gastroesophageal reflux disease)    Glaucoma    Hypercholesterolemia    Hypertension    Past Surgical History:  Procedure Laterality Date   ABDOMINAL HYSTERECTOMY  1976   TONSILLECTOMY AND ADENOIDECTOMY  1935   Family History  Problem Relation Age of Onset   Lung cancer Sister        died 87   Stroke Mother    Stroke Father    Diabetes Maternal Grandmother    Diabetes Maternal Grandfather    Breast cancer Maternal Aunt        75's   Breast cancer Daughter    Social History   Socioeconomic History   Marital status: Widowed    Spouse name: Not on file   Number of children: 5   Years of education: Not on file   Highest education level: Not on file  Occupational History   Not on file  Tobacco Use   Smoking status: Never   Smokeless tobacco: Never  Substance and Sexual Activity   Alcohol use: No    Alcohol/week: 0.0 standard drinks of alcohol   Drug use: No   Sexual activity: Never  Other Topics Concern   Not on file  Social History Narrative   widow   Social Drivers of Health   Financial Resource Strain: Low Risk  (01/01/2024)   Overall Financial Resource Strain (CARDIA)    Difficulty of Paying Living Expenses: Not hard at all  Food Insecurity: No Food Insecurity (01/01/2024)   Hunger Vital Sign    Worried About Running Out of Food in the Last Year: Never true    Ran Out of Food in the Last Year: Never true  Transportation Needs: No Transportation Needs (01/01/2024)   PRAPARE - Administrator, Civil Service (Medical): No    Lack of Transportation (Non-Medical): No  Physical Activity: Inactive (01/01/2024)   Exercise Vital Sign    Days of Exercise per Week: 0 days    Minutes of Exercise per Session: 0 min  Stress: No Stress Concern Present (01/01/2024)   [employer] of Occupational Health - Occupational Stress Questionnaire     Feeling of Stress: Not at all  Social Connections: Socially Isolated (01/01/2024)   Social Connection and Isolation Panel    Frequency of Communication with Friends and Family: More than three times a week    Frequency of Social Gatherings with Friends and Family: Three times a week    Attends Religious Services: Never    Active Member of Clubs or Organizations: No    Attends Banker Meetings: Never    Marital Status: Widowed    Tobacco Counseling Counseling given: Not Answered    Clinical Intake:  Pre-visit preparation completed: Yes  Pain : No/denies pain     BMI - recorded: 25.79 Nutritional Status: BMI 25 -29 Overweight Nutritional Risks: None Diabetes: No  No results found for: HGBA1C   How often do you need to have someone help you when you  read instructions, pamphlets, or other written materials from your doctor or pharmacy?: 1 - Never  Interpreter Needed?: No  Information entered by :: R. Aamilah Augenstein LPN   Activities of Daily Living     01/01/2024    1:27 PM  In your present state of health, do you have any difficulty performing the following activities:  Hearing? 1  Vision? 0  Difficulty concentrating or making decisions? 1  Walking or climbing stairs? 1  Dressing or bathing? 0  Doing errands, shopping? 1  Preparing Food and eating ? N  Using the Toilet? N  In the past six months, have you accidently leaked urine? Y  Do you have problems with loss of bowel control? N  Managing your Medications? N  Managing your Finances? Y  Housekeeping or managing your Housekeeping? Y    Patient Care Team: Glendia Shad, MD as PCP - General (Internal Medicine)  I have updated your Care Teams any recent Medical Services you may have received from other providers in the past year.     Assessment:   This is a routine wellness examination for Janet Moran.  Hearing/Vision screen Hearing Screening - Comments:: Some issues Vision Screening - Comments::  glasses   Goals Addressed             This Visit's Progress    Patient Stated       Wants to try to walk more       Depression Screen     01/01/2024    1:34 PM 12/26/2022    1:52 PM 05/07/2022    2:01 PM 12/21/2021   11:43 AM 11/01/2021   10:37 AM 01/16/2021   12:38 PM 10/27/2020   11:28 AM  PHQ 2/9 Scores  PHQ - 2 Score 0 0 0 0 0 0 0  PHQ- 9 Score 0 3 1        Fall Risk     01/01/2024    1:30 PM 12/26/2022    1:45 PM 05/07/2022    2:01 PM 12/21/2021   11:43 AM 11/01/2021   10:37 AM  Fall Risk   Falls in the past year? 0 0 0 0 0  Number falls in past yr: 0 0 0 0 0  Injury with Fall? 0 0 0 0 0  Risk for fall due to : No Fall Risks No Fall Risks No Fall Risks  No Fall Risks  Follow up Falls evaluation completed;Falls prevention discussed Falls prevention discussed;Falls evaluation completed Falls evaluation completed Falls evaluation completed  Falls evaluation completed      Data saved with a previous flowsheet row definition    MEDICARE RISK AT HOME:  Medicare Risk at Home Any stairs in or around the home?: Yes If so, are there any without handrails?: No Home free of loose throw rugs in walkways, pet beds, electrical cords, etc?: Yes Adequate lighting in your home to reduce risk of falls?: Yes Life alert?: Yes Use of a cane, walker or w/c?: Yes Grab bars in the bathroom?: Yes Shower chair or bench in shower?: No Elevated toilet seat or a handicapped toilet?: No  TIMED UP AND GO:  Was the test performed?  No  Cognitive Function: 6CIT completed    10/19/2016    2:30 PM  MMSE - Mini Mental State Exam  Orientation to time 5   Orientation to Place 5   Registration 3   Attention/ Calculation 5   Recall 3   Language- name 2 objects 2   Language-  repeat 1  Language- follow 3 step command 3   Language- read & follow direction 1   Write a sentence 1   Copy design 1   Total score 30      Data saved with a previous flowsheet row definition        01/01/2024     1:41 PM 12/26/2022    1:58 PM 12/21/2021   11:48 AM 10/27/2019   11:59 AM 10/24/2018   12:23 PM  6CIT Screen  What Year? 0 points 0 points 0 points 0 points 0 points  What month? 0 points 0 points 0 points 0 points 0 points  What time? 0 points 0 points 0 points 0 points 0 points  Count back from 20 0 points 0 points 0 points 0 points 0 points  Months in reverse 0 points 0 points 0 points 0 points 0 points  Repeat phrase 0 points 6 points 0 points 0 points 0 points  Total Score 0 points 6 points 0 points 0 points 0 points    Immunizations Immunization History  Administered Date(s) Administered   INFLUENZA, HIGH DOSE SEASONAL PF 01/04/2016, 12/07/2016, 02/14/2018   Influenza Split 01/14/2012, 12/15/2013   Influenza,inj,Quad PF,6+ Mos 02/24/2013, 03/15/2015   Influenza-Unspecified 01/08/2020   PFIZER(Purple Top)SARS-COV-2 Vaccination 04/15/2019, 05/13/2019, 01/09/2020, 08/24/2020   Pneumococcal Conjugate-13 01/10/2017   Pneumococcal Polysaccharide-23 02/14/2018    Screening Tests Health Maintenance  Topic Date Due   Zoster Vaccines- Shingrix (1 of 2) Never done   Influenza Vaccine  11/08/2023   COVID-19 Vaccine (5 - 2025-26 season) 12/09/2023   Medicare Annual Wellness (AWV)  12/26/2023   Pneumococcal Vaccine: 50+ Years  Completed   DEXA SCAN  Completed   HPV VACCINES  Aged Out   Meningococcal B Vaccine  Aged Out   DTaP/Tdap/Td  Discontinued   Mammogram  Discontinued    Health Maintenance Items Addressed: Discussed the need to update flu, covid and shingles vaccines  Additional Screening:  Vision Screening: Recommended annual ophthalmology exams for early detection of glaucoma and other disorders of the eye. Is the patient up to date with their annual eye exam?  Yes  Who is the provider or what is the name of the office in which the patient attends annual eye exams?  [facility]  Dental Screening: Recommended annual dental exams for proper oral hygiene  Community  Resource Referral / Chronic Care Management: CRR required this visit?  No   CCM required this visit?  No   Plan:    I have personally reviewed and noted the following in the patient's chart:   Medical and social history Use of alcohol, tobacco or illicit drugs  Current medications and supplements including opioid prescriptions. Patient is not currently taking opioid prescriptions. Functional ability and status Nutritional status Physical activity Advanced directives List of other physicians Hospitalizations, surgeries, and ER visits in previous 12 months Vitals Screenings to include cognitive, depression, and falls Referrals and appointments  In addition, I have reviewed and discussed with patient certain preventive protocols, quality metrics, and best practice recommendations. A written personalized care plan for preventive services as well as general preventive health recommendations were provided to patient.   Angeline Fredericks, LPN   [id number]   After Visit Summary: (MyChart) Due to this being a telephonic visit, the after visit summary with patients personalized plan was offered to patient via MyChart   Notes: Nothing significant to report at this time.

## 2024-01-01 NOTE — Patient Instructions (Signed)
 Ms. Terrero,  Thank you for taking the time for your Medicare Wellness Visit. I appreciate your continued commitment to your health goals. Please review the care plan we discussed, and feel free to reach out if I can assist you further.  Medicare recommends these wellness visits once per year to help you and your care team stay ahead of potential health issues. These visits are designed to focus on prevention, allowing your provider to concentrate on managing your acute and chronic conditions during your regular appointments.  Please note that Annual Wellness Visits do not include a physical exam. Some assessments may be limited, especially if the visit was conducted virtually. If needed, we may recommend a separate in-person follow-up with your provider.  Ongoing Care Seeing your primary care provider every 3 to 6 months helps us  monitor your health and provide consistent, personalized care.  Remember to update your flu and covid vaccines and consider getting the shingles vaccines.   Referrals If a referral was made during today's visit and you haven't received any updates within two weeks, please contact the referred provider directly to check on the status.  Recommended Screenings:  Health Maintenance  Topic Date Due   Zoster (Shingles) Vaccine (1 of 2) Never done   Flu Shot  11/08/2023   COVID-19 Vaccine (5 - 2025-26 season) 12/09/2023   Medicare Annual Wellness Visit  12/31/2024   Pneumococcal Vaccine for age over 34  Completed   DEXA scan (bone density measurement)  Completed   HPV Vaccine  Aged Out   Meningitis B Vaccine  Aged Out   DTaP/Tdap/Td vaccine  Discontinued   Breast Cancer Screening  Discontinued       01/01/2024    1:41 PM  Advanced Directives  Does Patient Have a Medical Advance Directive? Yes  Type of Estate agent of [place];Living will  Does patient want to make changes to medical advance directive? No - Patient declined  Copy of  Healthcare Power of Attorney in Chart? Yes - validated most recent copy scanned in chart (See row information)   Advance Care Planning is important because it: Ensures you receive medical care that aligns with your values, goals, and preferences. Provides guidance to your family and loved ones, reducing the emotional burden of decision-making during critical moments.  Vision: Annual vision screenings are recommended for early detection of glaucoma, cataracts, and diabetic retinopathy. These exams can also reveal signs of chronic conditions such as diabetes and high blood pressure.  Dental: Annual dental screenings help detect early signs of oral cancer, gum disease, and other conditions linked to overall health, including heart disease and diabetes.  Please see the attached documents for additional preventive care recommendations.

## 2024-02-19 ENCOUNTER — Other Ambulatory Visit: Payer: Self-pay | Admitting: Internal Medicine

## 2024-02-19 DIAGNOSIS — E7800 Pure hypercholesterolemia, unspecified: Secondary | ICD-10-CM

## 2024-02-28 ENCOUNTER — Other Ambulatory Visit: Payer: Self-pay | Admitting: Internal Medicine

## 2024-02-28 DIAGNOSIS — I10 Essential (primary) hypertension: Secondary | ICD-10-CM

## 2024-03-20 ENCOUNTER — Other Ambulatory Visit: Payer: Self-pay | Admitting: Internal Medicine

## 2024-03-20 DIAGNOSIS — I10 Essential (primary) hypertension: Secondary | ICD-10-CM

## 2024-05-11 ENCOUNTER — Ambulatory Visit: Payer: PPO | Admitting: Internal Medicine

## 2025-01-05 ENCOUNTER — Ambulatory Visit
